# Patient Record
Sex: Male | Born: 1945 | Race: Black or African American | Hispanic: No | Marital: Married | Smoking: Current every day smoker
Health system: Southern US, Community
[De-identification: ages and names within clinical notes are randomized; demographics above are authoritative.]

## PROBLEM LIST (undated history)

## (undated) DIAGNOSIS — F419 Anxiety disorder, unspecified: Secondary | ICD-10-CM

## (undated) DIAGNOSIS — L299 Pruritus, unspecified: Secondary | ICD-10-CM

## (undated) DIAGNOSIS — B1920 Unspecified viral hepatitis C without hepatic coma: Secondary | ICD-10-CM

## (undated) DIAGNOSIS — I10 Essential (primary) hypertension: Secondary | ICD-10-CM

## (undated) DIAGNOSIS — H269 Unspecified cataract: Secondary | ICD-10-CM

## (undated) DIAGNOSIS — R0602 Shortness of breath: Secondary | ICD-10-CM

## (undated) DIAGNOSIS — J449 Chronic obstructive pulmonary disease, unspecified: Secondary | ICD-10-CM

## (undated) DIAGNOSIS — H409 Unspecified glaucoma: Secondary | ICD-10-CM

## (undated) DIAGNOSIS — R945 Abnormal results of liver function studies: Secondary | ICD-10-CM

## (undated) DIAGNOSIS — E785 Hyperlipidemia, unspecified: Secondary | ICD-10-CM

## (undated) DIAGNOSIS — F32A Depression, unspecified: Secondary | ICD-10-CM

## (undated) DIAGNOSIS — H9190 Unspecified hearing loss, unspecified ear: Secondary | ICD-10-CM

## (undated) DIAGNOSIS — F329 Major depressive disorder, single episode, unspecified: Secondary | ICD-10-CM

## (undated) DIAGNOSIS — K219 Gastro-esophageal reflux disease without esophagitis: Secondary | ICD-10-CM

## (undated) HISTORY — DX: Major depressive disorder, single episode, unspecified: F32.9

## (undated) HISTORY — DX: Chronic obstructive pulmonary disease, unspecified: J44.9

## (undated) HISTORY — DX: Hyperlipidemia, unspecified: E78.5

## (undated) HISTORY — DX: Anxiety disorder, unspecified: F41.9

## (undated) HISTORY — DX: Gastro-esophageal reflux disease without esophagitis: K21.9

## (undated) HISTORY — DX: Depression, unspecified: F32.A

## (undated) HISTORY — DX: Abnormal results of liver function studies: R94.5

## (undated) HISTORY — DX: Unspecified viral hepatitis C without hepatic coma: B19.20

## (undated) HISTORY — DX: Pruritus, unspecified: L29.9

## (undated) HISTORY — DX: Essential (primary) hypertension: I10

---

## 1998-02-19 ENCOUNTER — Encounter: Payer: Self-pay | Admitting: Emergency Medicine

## 1998-02-19 ENCOUNTER — Emergency Department (HOSPITAL_COMMUNITY): Admission: EM | Admit: 1998-02-19 | Discharge: 1998-02-19 | Payer: Self-pay | Admitting: Emergency Medicine

## 1998-02-20 ENCOUNTER — Emergency Department (HOSPITAL_COMMUNITY): Admission: EM | Admit: 1998-02-20 | Discharge: 1998-02-20 | Payer: Self-pay | Admitting: Emergency Medicine

## 1998-02-20 ENCOUNTER — Encounter: Payer: Self-pay | Admitting: *Deleted

## 1998-03-05 ENCOUNTER — Emergency Department (HOSPITAL_COMMUNITY): Admission: EM | Admit: 1998-03-05 | Discharge: 1998-03-05 | Payer: Self-pay | Admitting: Emergency Medicine

## 1998-03-05 ENCOUNTER — Encounter: Payer: Self-pay | Admitting: Emergency Medicine

## 1998-03-18 ENCOUNTER — Emergency Department (HOSPITAL_COMMUNITY): Admission: EM | Admit: 1998-03-18 | Discharge: 1998-03-18 | Payer: Self-pay | Admitting: Emergency Medicine

## 1998-03-18 ENCOUNTER — Encounter: Payer: Self-pay | Admitting: Emergency Medicine

## 1998-04-17 ENCOUNTER — Encounter: Payer: Self-pay | Admitting: Emergency Medicine

## 1998-04-17 ENCOUNTER — Emergency Department (HOSPITAL_COMMUNITY): Admission: EM | Admit: 1998-04-17 | Discharge: 1998-04-17 | Payer: Self-pay | Admitting: Emergency Medicine

## 1998-09-02 ENCOUNTER — Encounter: Payer: Self-pay | Admitting: Emergency Medicine

## 1998-09-02 ENCOUNTER — Emergency Department (HOSPITAL_COMMUNITY): Admission: EM | Admit: 1998-09-02 | Discharge: 1998-09-02 | Payer: Self-pay | Admitting: *Deleted

## 1998-12-15 ENCOUNTER — Emergency Department (HOSPITAL_COMMUNITY): Admission: EM | Admit: 1998-12-15 | Discharge: 1998-12-15 | Payer: Self-pay | Admitting: Emergency Medicine

## 1999-06-17 ENCOUNTER — Encounter: Payer: Self-pay | Admitting: Emergency Medicine

## 1999-06-17 ENCOUNTER — Emergency Department (HOSPITAL_COMMUNITY): Admission: EM | Admit: 1999-06-17 | Discharge: 1999-06-17 | Payer: Self-pay | Admitting: Emergency Medicine

## 2000-01-06 ENCOUNTER — Encounter: Payer: Self-pay | Admitting: Emergency Medicine

## 2000-01-06 ENCOUNTER — Emergency Department (HOSPITAL_COMMUNITY): Admission: EM | Admit: 2000-01-06 | Discharge: 2000-01-06 | Payer: Self-pay | Admitting: Emergency Medicine

## 2000-03-24 ENCOUNTER — Encounter: Payer: Self-pay | Admitting: Emergency Medicine

## 2000-03-24 ENCOUNTER — Emergency Department (HOSPITAL_COMMUNITY): Admission: EM | Admit: 2000-03-24 | Discharge: 2000-03-24 | Payer: Self-pay | Admitting: Emergency Medicine

## 2000-03-30 ENCOUNTER — Encounter: Admission: RE | Admit: 2000-03-30 | Discharge: 2000-03-30 | Payer: Self-pay | Admitting: Internal Medicine

## 2000-07-24 ENCOUNTER — Emergency Department (HOSPITAL_COMMUNITY): Admission: EM | Admit: 2000-07-24 | Discharge: 2000-07-24 | Payer: Self-pay | Admitting: Emergency Medicine

## 2000-07-24 ENCOUNTER — Encounter: Payer: Self-pay | Admitting: Emergency Medicine

## 2001-01-24 HISTORY — PX: OPEN ANTERIOR SHOULDER RECONSTRUCTION: SHX2100

## 2001-04-02 ENCOUNTER — Emergency Department (HOSPITAL_COMMUNITY): Admission: EM | Admit: 2001-04-02 | Discharge: 2001-04-02 | Payer: Self-pay

## 2001-06-22 ENCOUNTER — Encounter: Payer: Self-pay | Admitting: Emergency Medicine

## 2001-06-22 ENCOUNTER — Emergency Department (HOSPITAL_COMMUNITY): Admission: EM | Admit: 2001-06-22 | Discharge: 2001-06-22 | Payer: Self-pay | Admitting: Emergency Medicine

## 2001-06-24 ENCOUNTER — Emergency Department (HOSPITAL_COMMUNITY): Admission: EM | Admit: 2001-06-24 | Discharge: 2001-06-24 | Payer: Self-pay | Admitting: Emergency Medicine

## 2001-06-26 ENCOUNTER — Encounter: Payer: Self-pay | Admitting: Orthopedic Surgery

## 2001-06-26 ENCOUNTER — Ambulatory Visit (HOSPITAL_COMMUNITY): Admission: RE | Admit: 2001-06-26 | Discharge: 2001-06-27 | Payer: Self-pay | Admitting: Orthopedic Surgery

## 2001-12-27 ENCOUNTER — Emergency Department (HOSPITAL_COMMUNITY): Admission: EM | Admit: 2001-12-27 | Discharge: 2001-12-27 | Payer: Self-pay | Admitting: Emergency Medicine

## 2001-12-27 ENCOUNTER — Encounter: Payer: Self-pay | Admitting: Emergency Medicine

## 2001-12-31 ENCOUNTER — Encounter: Payer: Self-pay | Admitting: Emergency Medicine

## 2001-12-31 ENCOUNTER — Emergency Department (HOSPITAL_COMMUNITY): Admission: EM | Admit: 2001-12-31 | Discharge: 2001-12-31 | Payer: Self-pay | Admitting: Emergency Medicine

## 2002-02-10 ENCOUNTER — Encounter: Payer: Self-pay | Admitting: Emergency Medicine

## 2002-02-10 ENCOUNTER — Emergency Department (HOSPITAL_COMMUNITY): Admission: EM | Admit: 2002-02-10 | Discharge: 2002-02-10 | Payer: Self-pay | Admitting: Emergency Medicine

## 2002-07-04 ENCOUNTER — Encounter: Admission: RE | Admit: 2002-07-04 | Discharge: 2002-07-04 | Payer: Self-pay | Admitting: Internal Medicine

## 2002-08-01 ENCOUNTER — Encounter: Admission: RE | Admit: 2002-08-01 | Discharge: 2002-08-01 | Payer: Self-pay | Admitting: Internal Medicine

## 2003-10-17 ENCOUNTER — Ambulatory Visit (HOSPITAL_COMMUNITY): Admission: RE | Admit: 2003-10-17 | Discharge: 2003-10-17 | Payer: Self-pay | Admitting: Internal Medicine

## 2003-10-17 ENCOUNTER — Ambulatory Visit: Payer: Self-pay | Admitting: Internal Medicine

## 2003-11-18 ENCOUNTER — Emergency Department (HOSPITAL_COMMUNITY): Admission: EM | Admit: 2003-11-18 | Discharge: 2003-11-18 | Payer: Self-pay | Admitting: Emergency Medicine

## 2003-12-02 ENCOUNTER — Emergency Department (HOSPITAL_COMMUNITY): Admission: EM | Admit: 2003-12-02 | Discharge: 2003-12-02 | Payer: Self-pay | Admitting: Emergency Medicine

## 2004-05-19 ENCOUNTER — Ambulatory Visit: Payer: Self-pay | Admitting: Internal Medicine

## 2005-09-27 ENCOUNTER — Encounter (INDEPENDENT_AMBULATORY_CARE_PROVIDER_SITE_OTHER): Payer: Self-pay | Admitting: Cardiology

## 2005-09-27 ENCOUNTER — Ambulatory Visit: Payer: Self-pay | Admitting: Internal Medicine

## 2005-09-27 ENCOUNTER — Observation Stay (HOSPITAL_COMMUNITY): Admission: EM | Admit: 2005-09-27 | Discharge: 2005-09-28 | Payer: Self-pay | Admitting: Emergency Medicine

## 2005-09-27 ENCOUNTER — Encounter: Payer: Self-pay | Admitting: Vascular Surgery

## 2005-10-03 ENCOUNTER — Ambulatory Visit: Payer: Self-pay

## 2005-11-10 ENCOUNTER — Emergency Department (HOSPITAL_COMMUNITY): Admission: EM | Admit: 2005-11-10 | Discharge: 2005-11-10 | Payer: Self-pay | Admitting: Emergency Medicine

## 2005-11-26 ENCOUNTER — Emergency Department (HOSPITAL_COMMUNITY): Admission: EM | Admit: 2005-11-26 | Discharge: 2005-11-26 | Payer: Self-pay | Admitting: Emergency Medicine

## 2007-03-07 ENCOUNTER — Ambulatory Visit: Payer: Self-pay | Admitting: Internal Medicine

## 2007-03-07 ENCOUNTER — Encounter (INDEPENDENT_AMBULATORY_CARE_PROVIDER_SITE_OTHER): Payer: Self-pay | Admitting: Infectious Diseases

## 2007-03-07 LAB — CONVERTED CEMR LAB
AST: 31 units/L (ref 0–37)
Albumin: 4.1 g/dL (ref 3.5–5.2)
Alkaline Phosphatase: 78 units/L (ref 39–117)
Sodium: 139 meq/L (ref 135–145)
Total Bilirubin: 0.5 mg/dL (ref 0.3–1.2)
Total Protein: 7.3 g/dL (ref 6.0–8.3)

## 2007-03-08 ENCOUNTER — Encounter (INDEPENDENT_AMBULATORY_CARE_PROVIDER_SITE_OTHER): Payer: Self-pay | Admitting: Infectious Diseases

## 2007-03-08 ENCOUNTER — Ambulatory Visit (HOSPITAL_COMMUNITY): Admission: RE | Admit: 2007-03-08 | Discharge: 2007-03-08 | Payer: Self-pay | Admitting: Infectious Diseases

## 2007-03-08 LAB — CONVERTED CEMR LAB
Leukocytes, UA: NEGATIVE
Nitrite: NEGATIVE
Protein, ur: 100 mg/dL — AB
RBC / HPF: NONE SEEN (ref <3)
Specific Gravity, Urine: 1.012 (ref 1.005–1.03)
Urobilinogen, UA: 0.2 (ref 0.0–1.0)
WBC, UA: NONE SEEN cells/hpf (ref <3)

## 2007-03-21 ENCOUNTER — Ambulatory Visit: Payer: Self-pay | Admitting: Internal Medicine

## 2007-05-21 ENCOUNTER — Encounter (INDEPENDENT_AMBULATORY_CARE_PROVIDER_SITE_OTHER): Payer: Self-pay | Admitting: Internal Medicine

## 2007-06-15 ENCOUNTER — Ambulatory Visit: Payer: Self-pay | Admitting: Internal Medicine

## 2007-06-15 ENCOUNTER — Ambulatory Visit (HOSPITAL_COMMUNITY): Admission: RE | Admit: 2007-06-15 | Discharge: 2007-06-15 | Payer: Self-pay | Admitting: Internal Medicine

## 2007-06-15 ENCOUNTER — Encounter (INDEPENDENT_AMBULATORY_CARE_PROVIDER_SITE_OTHER): Payer: Self-pay | Admitting: Infectious Diseases

## 2007-06-15 LAB — CONVERTED CEMR LAB
CO2: 23 meq/L (ref 19–32)
Calcium: 8.5 mg/dL (ref 8.4–10.5)
Creatinine, Ser: 1.37 mg/dL (ref 0.40–1.50)

## 2007-07-01 ENCOUNTER — Emergency Department (HOSPITAL_COMMUNITY): Admission: EM | Admit: 2007-07-01 | Discharge: 2007-07-01 | Payer: Self-pay | Admitting: Emergency Medicine

## 2007-07-09 ENCOUNTER — Encounter: Admission: RE | Admit: 2007-07-09 | Discharge: 2007-10-07 | Payer: Self-pay | Admitting: Infectious Diseases

## 2007-08-09 ENCOUNTER — Encounter (INDEPENDENT_AMBULATORY_CARE_PROVIDER_SITE_OTHER): Payer: Self-pay | Admitting: *Deleted

## 2007-08-20 ENCOUNTER — Encounter (INDEPENDENT_AMBULATORY_CARE_PROVIDER_SITE_OTHER): Payer: Self-pay | Admitting: Internal Medicine

## 2007-09-05 ENCOUNTER — Telehealth: Payer: Self-pay | Admitting: *Deleted

## 2007-09-25 ENCOUNTER — Encounter (INDEPENDENT_AMBULATORY_CARE_PROVIDER_SITE_OTHER): Payer: Self-pay | Admitting: Internal Medicine

## 2007-09-25 ENCOUNTER — Ambulatory Visit: Payer: Self-pay | Admitting: Internal Medicine

## 2007-09-25 DIAGNOSIS — F4320 Adjustment disorder, unspecified: Secondary | ICD-10-CM | POA: Insufficient documentation

## 2007-10-19 ENCOUNTER — Encounter: Payer: Self-pay | Admitting: Internal Medicine

## 2007-10-19 ENCOUNTER — Ambulatory Visit: Payer: Self-pay | Admitting: Internal Medicine

## 2007-10-19 ENCOUNTER — Encounter (INDEPENDENT_AMBULATORY_CARE_PROVIDER_SITE_OTHER): Payer: Self-pay | Admitting: Internal Medicine

## 2007-10-19 ENCOUNTER — Ambulatory Visit (HOSPITAL_COMMUNITY): Admission: RE | Admit: 2007-10-19 | Discharge: 2007-10-19 | Payer: Self-pay | Admitting: Internal Medicine

## 2007-10-19 DIAGNOSIS — K219 Gastro-esophageal reflux disease without esophagitis: Secondary | ICD-10-CM

## 2007-10-19 LAB — CONVERTED CEMR LAB
BUN: 20 mg/dL (ref 6–23)
Calcium: 9 mg/dL (ref 8.4–10.5)
Chloride: 110 meq/L (ref 96–112)
Creatinine, Ser: 1.17 mg/dL (ref 0.40–1.50)
Eosinophils Absolute: 0.2 10*3/uL (ref 0.0–0.7)
HCT: 43.3 % (ref 39.0–52.0)
Hemoglobin: 15.2 g/dL (ref 13.0–17.0)
Lymphs Abs: 2.4 10*3/uL (ref 0.7–4.0)
Monocytes Relative: 8 % (ref 3–12)
Neutrophils Relative %: 59 % (ref 43–77)
RBC: 4.58 M/uL (ref 4.22–5.81)
WBC: 7.8 10*3/uL (ref 4.0–10.5)

## 2008-03-27 ENCOUNTER — Ambulatory Visit: Payer: Self-pay | Admitting: Internal Medicine

## 2008-03-27 ENCOUNTER — Encounter (INDEPENDENT_AMBULATORY_CARE_PROVIDER_SITE_OTHER): Payer: Self-pay | Admitting: Internal Medicine

## 2008-03-27 LAB — CONVERTED CEMR LAB
Band Neutrophils: 0 % (ref 0–10)
Basophils Absolute: 0 10*3/uL (ref 0.0–0.1)
Eosinophils Absolute: 0.1 10*3/uL (ref 0.0–0.7)
Hemoglobin: 15.5 g/dL (ref 13.0–17.0)
Lymphs Abs: 2.5 10*3/uL (ref 0.7–4.0)
Monocytes Relative: 9 % (ref 3–12)
Neutro Abs: 6.4 10*3/uL (ref 1.7–7.7)
Neutrophils Relative %: 65 % (ref 43–77)

## 2008-03-28 ENCOUNTER — Ambulatory Visit (HOSPITAL_COMMUNITY): Admission: RE | Admit: 2008-03-28 | Discharge: 2008-03-28 | Payer: Self-pay | Admitting: Internal Medicine

## 2008-03-31 ENCOUNTER — Telehealth (INDEPENDENT_AMBULATORY_CARE_PROVIDER_SITE_OTHER): Payer: Self-pay | Admitting: Internal Medicine

## 2008-04-17 ENCOUNTER — Ambulatory Visit: Payer: Self-pay | Admitting: Internal Medicine

## 2008-10-10 ENCOUNTER — Ambulatory Visit: Payer: Self-pay | Admitting: Internal Medicine

## 2008-10-10 DIAGNOSIS — N401 Enlarged prostate with lower urinary tract symptoms: Secondary | ICD-10-CM

## 2008-10-10 LAB — CONVERTED CEMR LAB
BUN: 16 mg/dL (ref 6–23)
CO2: 24 meq/L (ref 19–32)
Calcium: 8.8 mg/dL (ref 8.4–10.5)
Chloride: 110 meq/L (ref 96–112)
Cholesterol: 147 mg/dL (ref 0–200)
Glucose, Bld: 71 mg/dL (ref 70–99)
HDL: 34 mg/dL — ABNORMAL LOW (ref >39)
Potassium: 4 meq/L (ref 3.5–5.3)
Total CHOL/HDL Ratio: 4.3
Triglycerides: 188 mg/dL — ABNORMAL HIGH (ref <150)

## 2008-12-25 ENCOUNTER — Ambulatory Visit: Payer: Self-pay | Admitting: Internal Medicine

## 2009-02-17 ENCOUNTER — Ambulatory Visit: Payer: Self-pay | Admitting: Internal Medicine

## 2009-02-17 DIAGNOSIS — I10 Essential (primary) hypertension: Secondary | ICD-10-CM

## 2009-02-19 ENCOUNTER — Telehealth: Payer: Self-pay | Admitting: Internal Medicine

## 2009-03-12 ENCOUNTER — Ambulatory Visit: Payer: Self-pay | Admitting: Internal Medicine

## 2009-03-12 ENCOUNTER — Telehealth: Payer: Self-pay | Admitting: Internal Medicine

## 2009-03-12 DIAGNOSIS — F341 Dysthymic disorder: Secondary | ICD-10-CM | POA: Insufficient documentation

## 2009-03-25 ENCOUNTER — Ambulatory Visit: Payer: Self-pay | Admitting: Infectious Diseases

## 2009-03-25 DIAGNOSIS — N529 Male erectile dysfunction, unspecified: Secondary | ICD-10-CM

## 2009-03-25 LAB — CONVERTED CEMR LAB
BUN: 18 mg/dL (ref 6–23)
CO2: 25 meq/L (ref 19–32)
Calcium: 8.9 mg/dL (ref 8.4–10.5)
Chloride: 104 meq/L (ref 96–112)
Creatinine, Ser: 0.95 mg/dL (ref 0.40–1.50)
Glucose, Bld: 72 mg/dL (ref 70–99)

## 2009-03-30 ENCOUNTER — Telehealth: Payer: Self-pay | Admitting: Internal Medicine

## 2009-04-15 ENCOUNTER — Telehealth: Payer: Self-pay | Admitting: Internal Medicine

## 2009-05-04 ENCOUNTER — Telehealth: Payer: Self-pay | Admitting: Internal Medicine

## 2009-05-14 ENCOUNTER — Ambulatory Visit: Payer: Self-pay | Admitting: Internal Medicine

## 2009-06-01 ENCOUNTER — Telehealth: Payer: Self-pay | Admitting: *Deleted

## 2009-06-05 ENCOUNTER — Encounter: Payer: Self-pay | Admitting: Internal Medicine

## 2009-06-11 ENCOUNTER — Encounter: Payer: Self-pay | Admitting: Internal Medicine

## 2009-06-12 ENCOUNTER — Telehealth: Payer: Self-pay | Admitting: Internal Medicine

## 2009-06-18 ENCOUNTER — Telehealth: Payer: Self-pay | Admitting: Internal Medicine

## 2009-06-24 DIAGNOSIS — L299 Pruritus, unspecified: Secondary | ICD-10-CM

## 2009-06-24 HISTORY — DX: Pruritus, unspecified: L29.9

## 2009-06-29 ENCOUNTER — Ambulatory Visit: Payer: Self-pay | Admitting: Internal Medicine

## 2009-07-10 ENCOUNTER — Telehealth: Payer: Self-pay | Admitting: Internal Medicine

## 2009-07-10 ENCOUNTER — Encounter: Payer: Self-pay | Admitting: Internal Medicine

## 2009-08-21 ENCOUNTER — Ambulatory Visit: Payer: Self-pay | Admitting: Internal Medicine

## 2009-08-21 LAB — CONVERTED CEMR LAB
AST: 29 units/L (ref 0–37)
BUN: 20 mg/dL (ref 6–23)
Basophils Absolute: 0 10*3/uL (ref 0.0–0.1)
Calcium: 9 mg/dL (ref 8.4–10.5)
Chloride: 103 meq/L (ref 96–112)
Creatinine, Ser: 1.11 mg/dL (ref 0.40–1.50)
Eosinophils Absolute: 0.1 10*3/uL (ref 0.0–0.7)
Eosinophils Relative: 2 % (ref 0–5)
HCT: 46.5 % (ref 39.0–52.0)
Hemoglobin: 16 g/dL (ref 13.0–17.0)
Lymphocytes Relative: 25 % (ref 12–46)
Lymphs Abs: 2 10*3/uL (ref 0.7–4.0)
MCV: 94.1 fL (ref >=78.0)
Monocytes Absolute: 0.6 10*3/uL (ref 0.1–1.0)
RDW: 13.4 % (ref 11.5–15.5)
Total Bilirubin: 1 mg/dL (ref 0.3–1.2)

## 2009-08-28 ENCOUNTER — Telehealth: Payer: Self-pay | Admitting: Internal Medicine

## 2009-08-31 ENCOUNTER — Telehealth: Payer: Self-pay | Admitting: Internal Medicine

## 2009-09-02 ENCOUNTER — Telehealth: Payer: Self-pay | Admitting: *Deleted

## 2009-09-29 ENCOUNTER — Telehealth: Payer: Self-pay | Admitting: Internal Medicine

## 2009-09-29 ENCOUNTER — Emergency Department (HOSPITAL_COMMUNITY)
Admission: EM | Admit: 2009-09-29 | Discharge: 2009-09-29 | Payer: Self-pay | Source: Home / Self Care | Admitting: Family Medicine

## 2009-09-30 ENCOUNTER — Telehealth: Payer: Self-pay | Admitting: *Deleted

## 2009-10-05 ENCOUNTER — Ambulatory Visit: Payer: Self-pay | Admitting: Internal Medicine

## 2009-10-05 ENCOUNTER — Encounter: Payer: Self-pay | Admitting: Licensed Clinical Social Worker

## 2009-10-05 ENCOUNTER — Encounter: Payer: Self-pay | Admitting: Internal Medicine

## 2009-10-06 LAB — CONVERTED CEMR LAB
BUN: 18 mg/dL
CO2: 29 meq/L
Calcium: 9 mg/dL
Chloride: 106 meq/L
Creatinine, Ser: 1.1 mg/dL
Glucose, Bld: 97 mg/dL
Potassium: 3.6 meq/L
Sodium: 144 meq/L
Vitamin B-12: 517 pg/mL

## 2009-10-07 ENCOUNTER — Telehealth: Payer: Self-pay | Admitting: *Deleted

## 2009-11-02 ENCOUNTER — Telehealth: Payer: Self-pay | Admitting: Internal Medicine

## 2009-11-02 ENCOUNTER — Emergency Department (HOSPITAL_COMMUNITY)
Admission: EM | Admit: 2009-11-02 | Discharge: 2009-11-02 | Payer: Self-pay | Source: Home / Self Care | Admitting: Family Medicine

## 2009-11-16 ENCOUNTER — Ambulatory Visit: Payer: Self-pay | Admitting: Internal Medicine

## 2009-11-16 DIAGNOSIS — L299 Pruritus, unspecified: Secondary | ICD-10-CM | POA: Insufficient documentation

## 2009-11-20 LAB — CONVERTED CEMR LAB
CO2: 27 meq/L (ref 19–32)
Creatinine, Ser: 1.28 mg/dL (ref 0.40–1.50)
Glucose, Bld: 93 mg/dL (ref 70–99)
Total Bilirubin: 0.9 mg/dL (ref 0.3–1.2)

## 2009-12-02 ENCOUNTER — Ambulatory Visit: Payer: Self-pay | Admitting: Internal Medicine

## 2009-12-02 DIAGNOSIS — R7401 Elevation of levels of liver transaminase levels: Secondary | ICD-10-CM | POA: Insufficient documentation

## 2009-12-02 DIAGNOSIS — R740 Nonspecific elevation of levels of transaminase and lactic acid dehydrogenase [LDH]: Secondary | ICD-10-CM

## 2009-12-23 ENCOUNTER — Ambulatory Visit: Payer: Self-pay | Admitting: Internal Medicine

## 2010-01-04 ENCOUNTER — Encounter: Payer: Self-pay | Admitting: Internal Medicine

## 2010-01-24 DIAGNOSIS — R7989 Other specified abnormal findings of blood chemistry: Secondary | ICD-10-CM

## 2010-01-24 DIAGNOSIS — B1920 Unspecified viral hepatitis C without hepatic coma: Secondary | ICD-10-CM

## 2010-01-24 DIAGNOSIS — R945 Abnormal results of liver function studies: Secondary | ICD-10-CM

## 2010-01-24 HISTORY — DX: Unspecified viral hepatitis C without hepatic coma: B19.20

## 2010-01-24 HISTORY — DX: Other specified abnormal findings of blood chemistry: R79.89

## 2010-01-24 HISTORY — DX: Abnormal results of liver function studies: R94.5

## 2010-02-01 ENCOUNTER — Encounter: Payer: Self-pay | Admitting: Internal Medicine

## 2010-02-01 ENCOUNTER — Ambulatory Visit
Admission: RE | Admit: 2010-02-01 | Discharge: 2010-02-01 | Payer: Self-pay | Source: Home / Self Care | Attending: Internal Medicine | Admitting: Internal Medicine

## 2010-02-02 ENCOUNTER — Ambulatory Visit: Admit: 2010-02-02 | Payer: Self-pay

## 2010-02-03 LAB — CONVERTED CEMR LAB
ALT: 35 U/L (ref 0–53)
AST: 45 U/L — ABNORMAL HIGH (ref 0–37)
Albumin: 4.2 g/dL (ref 3.5–5.2)
Alkaline Phosphatase: 189 U/L — ABNORMAL HIGH (ref 39–117)
Bilirubin, Direct: 0.7 mg/dL — ABNORMAL HIGH (ref 0.0–0.3)
Indirect Bilirubin: 0.7 mg/dL (ref 0.0–0.9)
Total Bilirubin: 1.4 mg/dL — ABNORMAL HIGH (ref 0.3–1.2)
Total Protein: 7.5 g/dL (ref 6.0–8.3)

## 2010-02-12 ENCOUNTER — Telehealth: Payer: Self-pay | Admitting: *Deleted

## 2010-02-18 ENCOUNTER — Ambulatory Visit: Admission: RE | Admit: 2010-02-18 | Discharge: 2010-02-18 | Payer: Self-pay | Source: Home / Self Care

## 2010-02-19 ENCOUNTER — Telehealth: Payer: Self-pay | Admitting: Internal Medicine

## 2010-02-19 ENCOUNTER — Ambulatory Visit
Admission: RE | Admit: 2010-02-19 | Discharge: 2010-02-19 | Payer: Self-pay | Source: Home / Self Care | Attending: Internal Medicine | Admitting: Internal Medicine

## 2010-02-19 LAB — CONVERTED CEMR LAB
AST: 59 units/L — ABNORMAL HIGH (ref 0–37)
Albumin: 3.8 g/dL (ref 3.5–5.2)
BUN: 15 mg/dL (ref 6–23)
Calcium: 9.6 mg/dL (ref 8.4–10.5)
Chloride: 106 meq/L (ref 96–112)
Creatinine, Ser: 0.9 mg/dL (ref 0.40–1.50)
Eosinophils Relative: 3 % (ref 0–5)
Glucose, Bld: 104 mg/dL — ABNORMAL HIGH (ref 70–99)
HCT: 42.8 % (ref 39.0–52.0)
HCV Ab: REACTIVE — AB
HIV: NONREACTIVE
Hemoglobin: 14.5 g/dL (ref 13.0–17.0)
Hep A Total Ab: POSITIVE — AB
Hep B Core Total Ab: POSITIVE — AB
Hep B S Ab: NEGATIVE
Hepatitis B Surface Ag: NEGATIVE
Lymphocytes Relative: 22 % (ref 12–46)
Lymphs Abs: 1.8 10*3/uL (ref 0.7–4.0)
Monocytes Absolute: 0.9 10*3/uL (ref 0.1–1.0)
Monocytes Relative: 10 % (ref 3–12)
Neutro Abs: 5.5 10*3/uL (ref 1.7–7.7)
RBC: 4.61 M/uL (ref 4.22–5.81)
RDW: 14.4 % (ref 11.5–15.5)
WBC: 8.5 10*3/uL (ref 4.0–10.5)

## 2010-02-23 ENCOUNTER — Telehealth: Payer: Self-pay | Admitting: Internal Medicine

## 2010-02-23 ENCOUNTER — Encounter: Payer: Self-pay | Admitting: Internal Medicine

## 2010-02-23 ENCOUNTER — Ambulatory Visit (HOSPITAL_COMMUNITY)
Admission: RE | Admit: 2010-02-23 | Discharge: 2010-02-23 | Payer: Self-pay | Source: Home / Self Care | Attending: Internal Medicine | Admitting: Internal Medicine

## 2010-02-23 ENCOUNTER — Inpatient Hospital Stay (HOSPITAL_COMMUNITY)
Admission: AD | Admit: 2010-02-23 | Discharge: 2010-02-25 | DRG: 442 | Disposition: A | Discharge status: Home / Self Care | Payer: Medicare HMO | Attending: Internal Medicine | Admitting: Internal Medicine

## 2010-02-23 DIAGNOSIS — J4489 Other specified chronic obstructive pulmonary disease: Secondary | ICD-10-CM | POA: Diagnosis present

## 2010-02-23 DIAGNOSIS — B199 Unspecified viral hepatitis without hepatic coma: Principal | ICD-10-CM | POA: Diagnosis present

## 2010-02-23 DIAGNOSIS — K769 Liver disease, unspecified: Secondary | ICD-10-CM | POA: Diagnosis present

## 2010-02-23 DIAGNOSIS — K5900 Constipation, unspecified: Secondary | ICD-10-CM | POA: Diagnosis present

## 2010-02-23 DIAGNOSIS — I2789 Other specified pulmonary heart diseases: Secondary | ICD-10-CM | POA: Diagnosis present

## 2010-02-23 DIAGNOSIS — L299 Pruritus, unspecified: Secondary | ICD-10-CM | POA: Diagnosis present

## 2010-02-23 DIAGNOSIS — J449 Chronic obstructive pulmonary disease, unspecified: Secondary | ICD-10-CM | POA: Diagnosis present

## 2010-02-23 DIAGNOSIS — F17200 Nicotine dependence, unspecified, uncomplicated: Secondary | ICD-10-CM | POA: Diagnosis present

## 2010-02-23 DIAGNOSIS — F411 Generalized anxiety disorder: Secondary | ICD-10-CM | POA: Diagnosis present

## 2010-02-23 DIAGNOSIS — N390 Urinary tract infection, site not specified: Secondary | ICD-10-CM | POA: Diagnosis present

## 2010-02-23 DIAGNOSIS — F341 Dysthymic disorder: Secondary | ICD-10-CM | POA: Diagnosis present

## 2010-02-23 DIAGNOSIS — R112 Nausea with vomiting, unspecified: Secondary | ICD-10-CM | POA: Diagnosis present

## 2010-02-23 LAB — COMPREHENSIVE METABOLIC PANEL
AST: 62 U/L — ABNORMAL HIGH (ref 0–37)
CO2: 21 mEq/L (ref 19–32)
Calcium: 9.2 mg/dL (ref 8.4–10.5)
Creatinine, Ser: 1.18 mg/dL (ref 0.4–1.5)
GFR calc Af Amer: >60 mL/min (ref >60)
GFR calc non Af Amer: >60 mL/min (ref >60)

## 2010-02-23 LAB — CBC
MCH: 30.9 pg (ref 26.0–34.0)
MCHC: 34.6 g/dL (ref 30.0–36.0)
Platelets: 316 10*3/uL (ref 150–400)
RDW: 14.1 % (ref 11.5–15.5)

## 2010-02-23 LAB — LIPID PANEL
Cholesterol: 157 mg/dL (ref 0–200)
HDL: 28 mg/dL — ABNORMAL LOW (ref >39)
LDL Cholesterol: 71 mg/dL (ref 0–99)
Total CHOL/HDL Ratio: 5.6 RATIO
Triglycerides: 290 mg/dL — ABNORMAL HIGH (ref <150)

## 2010-02-23 LAB — APTT: aPTT: 30 seconds (ref 24–37)

## 2010-02-23 LAB — PROTIME-INR: INR: 0.87 (ref 0.00–1.49)

## 2010-02-23 NOTE — Progress Notes (Signed)
Summary: med refill/gp  Phone Note Refill Request Message from:  Fax from Pharmacy on November 02, 2009 4:48 PM  Refills Requested: Medication #1:  ALPRAZOLAM 0.5 MG TABS Take 1 tablet by mouth two times a day as needed for anxiety.   Dosage confirmed as above?Dosage Confirmed   Last Refilled: 10/07/2009  Method Requested: Telephone to Pharmacy Initial call taken by: Chinita Pester RN,  November 02, 2009 4:48 PM  Follow-up for Phone Call        Rx completed in Dr. Tiajuana Amass Follow-up by: Jackson Latino MD,  November 03, 2009 1:41 PM    Prescriptions: ALPRAZOLAM 0.5 MG TABS (ALPRAZOLAM) Take 1 tablet by mouth two times a day as needed for anxiety.  #60 x 0   Entered and Authorized by:   Jackson Latino MD   Signed by:   Jackson Latino MD on 11/03/2009   Method used:   Telephoned to ...       [facility] [phone]* (retail)       [address]       [address]       [place], [place]  [address]       Ph: [phone]       Fax: [phone]   RxID:   [id number]   Appended Document: med refill/gp Alprazolam Rx called to [facility] pharmacy.

## 2010-02-23 NOTE — Progress Notes (Signed)
Summary: Medication change needed   Phone Note From Pharmacy   Caller: [facility] Department Summary of Call: Celexa is not available at the [facility].  Pharmacy has Wellbutrin SR 150, Fluoxetine 20 mg, Paroxtine 20 mg,  Sertratine 50 mg or Amitriptylene or Nortriplyene.  Pt needs a call at [phone] when done. Initial call taken by: Angelina Ok RN,  March 30, 2009 4:29 PM  Follow-up for Phone Call        Called Patient and will change to fluoxetine. Follow-up by: Jackson Latino MD,  March 30, 2009 4:56 PM    New/Updated Medications: FLUOXETINE HCL 20 MG CAPS (FLUOXETINE HCL) Take 1 tablet by mouth once a day Prescriptions: FLUOXETINE HCL 20 MG CAPS (FLUOXETINE HCL) Take 1 tablet by mouth once a day  #31 x 2   Entered and Authorized by:   Jackson Latino MD   Signed by:   Jackson Latino MD on 03/30/2009   Method used:   Faxed to ...       [facility] (retail)       [address] [place], [place]  [address]       Ph: [phone]       Fax: [phone]   RxID:   [id number]    Updated Prior Medication List: VENTOLIN HFA 108 (90 BASE) MCG/ACT AERS (ALBUTEROL SULFATE) use 1-2 puffs every 4 hours as needed for shortness of breath or wheezing SPIRIVA HANDIHALER 18 MCG CAPS (TIOTROPIUM BROMIDE MONOHYDRATE) 1 puff daily ADVAIR DISKUS 250-50 MCG/DOSE MISC (FLUTICASONE-SALMETEROL) 2 puff two times a day CARDURA 4 MG TABS (DOXAZOSIN MESYLATE) Take 1 tablet by mouth once a day CIPRODEX 0.3-0.1 % SUSP (CIPROFLOXACIN-DEXAMETHASONE) 4 drops to affected ear 4 times daily. ALPRAZOLAM 0.5 MG TABS (ALPRAZOLAM) Take 1 tablet by mouth two times a day as needed for anxiety. HYDROCHLOROTHIAZIDE 25 MG TABS (HYDROCHLOROTHIAZIDE) Take 1 tablet by mouth every morning FLUOXETINE HCL 20 MG CAPS (FLUOXETINE HCL) Take 1 tablet by mouth once a day  Current Allergies: No known allergies

## 2010-02-23 NOTE — Assessment & Plan Note (Signed)
Summary: itching/gg   Vital Signs:  Patient profile:   65 year old male Height:      69 inches (175.26 cm) Weight:      246.8 pounds (112.18 kg) BMI:     36.58 Temp:     96.8 degrees F (36 degrees C) oral Pulse rate:   72 / minute BP sitting:   140 / 81  (right arm)  Vitals Entered By: Stanton Kidney Ditzler RN (November 16, 2009 3:37 PM) CC: itching Is Patient Diabetic? No Pain Assessment Patient in pain? yes     Location: legs Intensity: 6-7 Type: sharp Onset of pain  years Nutritional Status BMI of > 30 = obese Nutritional Status Detail appetite good  Have you ever been in a relationship where you felt threatened, hurt or afraid?denies   Does patient need assistance? Functional Status Self care Ambulation Normal Comments Itching all over - no help with meds. Going on 6 weeks. Not sleeping due to itching.   Primary Care Provider:  Jackson Latino MD  CC:  itching.  History of Present Illness: This is a 65 year old pleasant male with PMH with Depression, COPD, HTN, BPH, who presents to the clinic for BP check and  intolerance of 2 medications.   1. Generalized itching, pt has been having itching ever since he was started on opiates for his OA, he went to ED recently, labs were WNL. no history of liver disease, alcohol abuse or IV drug abuse.   2. Depression/Anxiety: Stable  3. HTN: well controlled on HCTZ   Patient is feeling well and denies CP, abdominal pain, nausea, vomiting, HA's, palpitations, blurred vision. fever, chills, diarrhea, constipation or SOB.    Depression History:      The patient denies a depressed mood most of the day and a diminished interest in his usual daily activities.  The patient denies significant weight loss, significant weight gain, insomnia, hypersomnia, psychomotor agitation, psychomotor retardation, fatigue (loss of energy), feelings of worthlessness (guilt), impaired concentration (indecisiveness), and recurrent thoughts of death or suicide.          Preventive Screening-Counseling & Management  Alcohol-Tobacco     Alcohol type: BEER / EVERYDAY SOMETIMES     Smoking Status: current     Smoking Cessation Counseling: yes     Smoke Cessation Stage: ready     Packs/Day: 1 ppd     Year Started: ABOUT THE AGE OF 16  Caffeine-Diet-Exercise     Does Patient Exercise: no  Current Medications (verified): 1)  Ventolin Hfa 108 (90 Base) Mcg/act Aers (Albuterol Sulfate) .... Use 1-2 Puffs Every 4 Hours As Needed For Shortness of Breath or Wheezing 2)  Spiriva Handihaler 18 Mcg Caps (Tiotropium Bromide Monohydrate) .Marland Kitchen.. 1 Puff Daily 3)  Advair Diskus 250-50 Mcg/dose Misc (Fluticasone-Salmeterol) .Marland Kitchen.. 1 Puff Two Times A Day 4)  Alprazolam 0.5 Mg Tabs (Alprazolam) .... Take 1 Tablet By Mouth Two Times A Day As Needed For Anxiety. 5)  Hydrochlorothiazide 25 Mg Tabs (Hydrochlorothiazide) .... Take 1 Tablet By Mouth Every Morning 6)  Guaiatussin Ac 100-10 Mg/71ml Syrp (Guaifenesin-Codeine) .... Take One Teaspoonful Every 4 Hours As Needed For Cough 7)  Tramadol Hcl 50 Mg Tabs (Tramadol Hcl) .... Take 1 Tablet By Mouth Three Times A Day For Pain. 8)  Celexa 20 Mg Tabs (Citalopram Hydrobromide) .... Take One Tablet By Mouth Once A Day. 9)  Hydroxyzine Hcl 50 Mg Tabs (Hydroxyzine Hcl) .... Take 1 Tablet By Mouth Three Times A Day As Needed  For Itching  Allergies (verified): No Known Drug Allergies  Social History: Packs/Day:  1 ppd  Review of Systems       per HPI  Physical Exam  General:  Well-developed,well-nourished,itching - generalized Neck:  No deformities, masses, or tenderness noted. Lungs:  Normal respiratory effort, chest expands symmetrically. Lungs are clear to auscultation, no crackles or wheezes. Heart:  Normal rate and regular rhythm. S1 and S2 normal without gallop, murmur, click, rub or other extra sounds. Abdomen:  Bowel sounds positive,abdomen soft and non-tender without masses, organomegaly or hernias noted. Msk:   Tenderness to palpation and mild swelling on his right knee; no redness,no warmth and no other abnormalities appreciated. Pulses:  R and L carotid,radial,dorsalis pedis and posterior tibial pulses are full and equal bilaterally Extremities:  No clubbing, cyanosis, edema, or deformity noted with normal full range of motion of all joints.   Neurologic:  No cranial nerve deficits noted. Station and gait are normal. Plantar reflexes are down-going bilaterally. DTRs are symmetrical throughout. Sensory, motor and coordinative functions appear intact.   Impression & Recommendations:  Problem # 1:  PRURITUS (ICD-698.9) Assessment Comment Only ongoing since opiate initiation, will d/c all opiates and give tramadol for pain control.  will also check CMET to r/o liver etiology.  All other labs inc. CBC, ESR were negative in ED 2 weeks ago.  will also give hydroxyzine for itching.   Orders: T-Comprehensive Metabolic Panel ([id number])  Problem # 2:  OSTEOARTHRITIS, MULTIPLE JOINTS (ICD-715.89) Assessment: Comment Only per #1.  His updated medication list for this problem includes:    Tramadol Hcl 50 Mg Tabs (Tramadol hcl) .Marland Kitchen... Take 1 tablet by mouth three times a day for pain.  Problem # 3:  ESSENTIAL HYPERTENSION, BENIGN (ICD-401.1) Assessment: Comment Only Well controlled on current treatment, No new changes made today, Will continue to monitor.   His updated medication list for this problem includes:    Hydrochlorothiazide 25 Mg Tabs (Hydrochlorothiazide) .Marland Kitchen... Take 1 tablet by mouth every morning  BP today: 140/81 Prior BP: 121/76 (10/05/2009)  Labs Reviewed: K+: 3.6 (10/05/2009) Creat: : 1.10 (10/05/2009)   Chol: 147 (10/10/2008)   HDL: 34 (10/10/2008)   LDL: 75 (10/10/2008)   TG: 188 (10/10/2008)  Complete Medication List: 1)  Ventolin Hfa 108 (90 Base) Mcg/act Aers (Albuterol sulfate) .... Use 1-2 puffs every 4 hours as needed for shortness of breath or wheezing 2)  Spiriva  Handihaler 18 Mcg Caps (Tiotropium bromide monohydrate) .Marland Kitchen.. 1 puff daily 3)  Advair Diskus 250-50 Mcg/dose Misc (Fluticasone-salmeterol) .Marland Kitchen.. 1 puff two times a day 4)  Alprazolam 0.5 Mg Tabs (Alprazolam) .... Take 1 tablet by mouth two times a day as needed for anxiety. 5)  Hydrochlorothiazide 25 Mg Tabs (Hydrochlorothiazide) .... Take 1 tablet by mouth every morning 6)  Guaiatussin Ac 100-10 Mg/56ml Syrp (Guaifenesin-codeine) .... Take one teaspoonful every 4 hours as needed for cough 7)  Tramadol Hcl 50 Mg Tabs (Tramadol hcl) .... Take 1 tablet by mouth three times a day for pain. 8)  Celexa 20 Mg Tabs (Citalopram hydrobromide) .... Take one tablet by mouth once a day. 9)  Hydroxyzine Hcl 50 Mg Tabs (Hydroxyzine hcl) .... Take 1 tablet by mouth three times a day as needed for itching  Patient Instructions: 1)  Please schedule a follow-up appointment in 1 month. Prescriptions: HYDROXYZINE HCL 50 MG TABS (HYDROXYZINE HCL) Take 1 tablet by mouth three times a day as needed for itching  #90 x 0   Entered and  Authorized by:   Darnelle Maffucci MD   Signed by:   Darnelle Maffucci MD on 11/16/2009   Method used:   Print then Give to Patient   RxID:   [id number] TRAMADOL HCL 50 MG TABS (TRAMADOL HCL) Take 1 tablet by mouth three times a day for pain.  #90 x 0   Entered and Authorized by:   Darnelle Maffucci MD   Signed by:   Darnelle Maffucci MD on 11/16/2009   Method used:   Print then Give to Patient   RxID:   [id number]    Orders Added: 1)  T-Comprehensive Metabolic Panel [[id number]] 2)  Est. Patient Level IV [[address]]   Process Orders Check Orders Results:     [facility]: Check successful Tests Sent for requisitioning (November 16, 2009 4:35 PM):     11/16/2009: [facility] -- T-Comprehensive Metabolic Panel [id number] (signed)    Prevention & Chronic Care Immunizations   Influenza vaccine: Fluvax Non-MCR  (10/05/2009)   Influenza vaccine  deferral: Deferred  (10/10/2008)    Tetanus booster: Not documented    Pneumococcal vaccine: Pneumovax  (02/17/2009)    H. zoster vaccine: Not documented  Colorectal Screening   Hemoccult: Not documented    Colonoscopy: Multiple polys up to 15mm.  Tubular adenomas and hyperplastic polyps.  No dysplasia nor malignancy.  Repeat colonoscopy 5/14  (06/11/2009)   Colonoscopy action/deferral: GI referral  (05/14/2009)   Colonoscopy due: 06/11/2012  Other Screening   PSA: Not documented   PSA action/deferral: Discussed-decision deferred  (03/25/2009)   Smoking status: current  (11/16/2009)   Smoking cessation counseling: yes  (11/16/2009)  Lipids   Total Cholesterol: 147  (10/10/2008)   Lipid panel action/deferral: Lipid Panel ordered   LDL: 75  (10/10/2008)   LDL Direct: Not documented   HDL: 34  (10/10/2008)   Triglycerides: 188  (10/10/2008)  Hypertension   Last Blood Pressure: 140 / 81  (11/16/2009)   Serum creatinine: 1.10  (10/05/2009)   Serum potassium 3.6  (10/05/2009) CMP ordered   Self-Management Support :   Personal Goals (by the next clinic visit) :      Personal blood pressure goal: 140/90  (03/12/2009)   Patient will work on the following items until the next clinic visit to reach self-care goals:     Medications and monitoring: take my medicines every day, bring all of my medications to every visit  (11/16/2009)     Eating: eat more vegetables, use fresh or frozen vegetables, eat foods that are low in salt, eat fruit for snacks and desserts, limit or avoid alcohol  (11/16/2009)     Activity: take a 30 minute walk every day, take the stairs instead of the elevator, park at the far end of the parking lot  (11/16/2009)    Hypertension self-management support: Written self-care plan, Education handout, Resources for patients handout  (11/16/2009)   Hypertension self-care plan printed.   Hypertension education handout printed      Resource handout printed.

## 2010-02-23 NOTE — Medication Information (Signed)
Summary: PERCOCET  PERCOCET   Imported By: Margie Billet 07/29/2009 10:56:39  _____________________________________________________________________  External Attachment:    Type:   Image     Comment:   External Document

## 2010-02-23 NOTE — Assessment & Plan Note (Signed)
Summary: BP check and review meds/gg   Vital Signs:  Patient profile:   65 year old male Height:      69 inches (175.26 cm) Weight:      241.9 pounds (109.95 kg) BMI:     35.85 O2 Sat:      94 % on Room air Temp:     97.0 degrees F oral Pulse rate:   70 / minute BP sitting:   121 / 76  (right arm)  Vitals Entered By: Chinita Pester RN (October 05, 2009 9:41 AM)  O2 Flow:  Room air CC: BP check.  Also states had reaction to Prozac -"not myself", sleeping, outbursts.    Cardura causes itching and coughing-had to go to Urgent Care. Medication refills. Is Patient Diabetic? No Pain Assessment Patient in pain? no      Nutritional Status BMI of > 30 = obese  Have you ever been in a relationship where you felt threatened, hurt or afraid?Unable to ask; wife w/pt.   Does patient need assistance? Functional Status Self care Ambulation Normal   Primary Care Provider:  Jackson Latino MD  CC:  BP check.  Also states had reaction to Prozac -"not myself", sleeping, and outbursts.    Cardura causes itching and coughing-had to go to Urgent Care. Medication refills..  History of Present Illness: This is a 65 year old pleasant male with PMH with Depression, COPD, HTN, BPH, who presents to the clinic for BP check and  intolerance of 2 medications.   1. Depression/Anxiety: Patient stopped  3 days ago fluoxetin since he was feeling more depressed , sleepy,  sad, loss of appetite and  loss of energy. Wife noted since 04/08/2022 his symptoms has worsen gradualy especially after some of his friends passed away. Before 2022-04-08 he was doing some gardening and fishing but now he just sleeps all day long and is not interested in anything.  He does not have suicidal ideation or plans but he wants a pill to make him happy again and have more energy. He is currently also on Alprazolam 1 mg once a day.  Note:  Patient had complained about depression and anxiety in the past. He was started on Fluoxetin  but  discontinued  due to cost, availability and side effects of mood swings. He was started on Celexa in Feb/2011 and was tolerating it very well until April 07, 2009 when he had to be changed to Fluoxetin since [facility] was not carrying Celexa.  Patient was also thought to have adjustment disorder since he stopped working 2 years ago after  working for 44 years.    2. BPH: Stopped Doxazosin 3 days ago since patient noted a 2 week history of  itching and  cough which did not resolve with benadryl. He was also seen at an urgent care facility. , Note: Patient was started on Flomax(Tamulosin) for BPH, however patient was unable to obtain this medication as it is not available at [facility]. The patient was given doazosin but he developed dizziness and subsequently stopped taking it. The [facility] pharmacy was called and they do not carry any other medication for BPH besides Doxazosin, and patient did not t have insurance and was unable to pay for anything else therefore he was started on a lower dosage in Feb/2011. He was taking Doxazosin in the past.   3. HTN: well controlled on HCTZ        Hearing aid, ent ?    Depression History:  The patient denies a depressed mood most of the day.  Positive alarm features for depression include hypersomnia, fatigue (loss of energy), and feelings of worthlessness (guilt).  However, he denies significant weight loss, significant weight gain, and recurrent thoughts of death or suicide.        Suicide risk questions reveal that he wishes that he were dead and he has thought about ending his life.  The patient denies that he feels like life is not worth living.        Comments:  feeling loss of energy since he is not working. .   Preventive Screening-Counseling & Management  Alcohol-Tobacco     Alcohol type: BEER / EVERYDAY SOMETIMES     Smoking Status: current     Smoking Cessation Counseling: yes     Smoke Cessation Stage:  ready     Packs/Day: 1/2 ppd     Year Started: ABOUT THE AGE OF 16  Caffeine-Diet-Exercise     Does Patient Exercise: no  Current Medications (verified): 1)  Ventolin Hfa 108 (90 Base) Mcg/act Aers (Albuterol Sulfate) .... Use 1-2 Puffs Every 4 Hours As Needed For Shortness of Breath or Wheezing 2)  Spiriva Handihaler 18 Mcg Caps (Tiotropium Bromide Monohydrate) .Marland Kitchen.. 1 Puff Daily 3)  Advair Diskus 250-50 Mcg/dose Misc (Fluticasone-Salmeterol) .Marland Kitchen.. 1 Puff Two Times A Day 4)  Alprazolam 0.5 Mg Tabs (Alprazolam) .... Take 1 Tablet By Mouth Two Times A Day As Needed For Anxiety. 5)  Hydrochlorothiazide 25 Mg Tabs (Hydrochlorothiazide) .... Take 1 Tablet By Mouth Every Morning 6)  Guaiatussin Ac 100-10 Mg/77ml Syrp (Guaifenesin-Codeine) .... Take One Teaspoonful Every 4 Hours As Needed For Cough 7)  Vicodin 5-500 Mg Tabs (Hydrocodone-Acetaminophen) .... Take 1 Tab By Mouth Every 6hrs As Needed For Pain.  Allergies: No Known Drug Allergies  Physical Exam  General:  Well-developed,well-nourished,in no acute distress; alert,appropriate and cooperative throughout examination Neck:  No deformities, masses, or tenderness noted. Lungs:  Normal respiratory effort, chest expands symmetrically. Lungs are clear to auscultation, no crackles or wheezes. Heart:  Normal rate and regular rhythm. S1 and S2 normal without gallop, murmur, click, rub or other extra sounds. Abdomen:  Bowel sounds positive,abdomen soft and non-tender without masses, organomegaly or hernias noted. Pulses:  R and L carotid,radial,dorsalis pedis and posterior tibial pulses are full and equal bilaterally Extremities:  No clubbing, cyanosis, edema, or deformity noted with normal full range of motion of all joints.   Neurologic:  No cranial nerve deficits noted. Station and gait are normal. Plantar reflexes are down-going bilaterally. DTRs are symmetrical throughout. Sensory, motor and coordinative functions appear intact. Cervical  Nodes:  No lymphadenopathy noted Psych:  Cognition and judgment appear intact. Alert and cooperative with normal attention span and concentration. No apparent delusions, illusions, hallucinations   Impression & Recommendations:  Problem # 1:  DEPRESSION/ANXIETY (ICD-300.4) Fluoxetin was d/c since pt experienced significant deterioration of his depression. Patient was started on Celexa which he has used in the past and had tolerated it well. It had to be changed to Fluoxetin since [facility] was not carrying Celexa in March. But Celexa is on the 4 $ plan. Furthermore pt was referred by Dorothe Pea  to  a therapist  at [facility] on 10/6 and a Psychiatric appointment  with Dr. Artis Del[facility]) on Nov 30th for further evalutation. Continue to follow closely.   Orders: T-Vitamin B12 [phone])  Problem # 2:  HYPERTROPHY PROSTATE W/UR OBST & OTH  LUTS (ICD-600.01) Patient stopped Cardura since he experienced  itching and cough. Patient was started on Flomax(Tamulosin) in the past  however patient was unable to obtain this medication as it was not available at [facility]. The patient was given doazosin as a  replacement, but he developed dizziness and subsequently stopped taking it. The [facility] pharmacy was called and they do not carry any other medication for BPH besides Doxazosin, and patient does not have insurance and is unable to afford other medications. To decrease the level of dizzines, patient's doxazosin dosage was reduced. At this point Cardura was not restarted considering his symptoms. In the near futur he does need a medication for BPH to reduce the risk for obstruction. We may referral patient to Urology for further management.   The following medications were removed from the medication list:    Cardura 4 Mg Tabs (Doxazosin mesylate) .Marland Kitchen... 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 Take as directed  Problem # 4:  ESSENTIAL HYPERTENSION, BENIGN (ICD-401.1)  The following medications were removed from the medication list:    Cardura 4 Mg Tabs (Doxazosin mesylate) .Marland Kitchen... Take 1 tablet by mouth once a day His updated medication list for this problem includes:    Hydrochlorothiazide 25 Mg Tabs (Hydrochlorothiazide) .Marland Kitchen... 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 Take 1 tab by mouth every 6hrs as needed for pain.  Complete Medication List: 1)  Ventolin Hfa 108 (90 Base) Mcg/act Aers (Albuterol sulfate) .... Use 1-2 puffs every 4 hours as needed for shortness of breath or wheezing 2)  Spiriva Handihaler 18 Mcg Caps (Tiotropium bromide monohydrate) .Marland Kitchen.. 1 puff daily 3)  Advair Diskus 250-50 Mcg/dose Misc (Fluticasone-salmeterol) .Marland Kitchen.. 1 puff two times a day 4)  Alprazolam 0.5 Mg Tabs (Alprazolam) .... Take 1 tablet by mouth two times a day as needed for anxiety. 5)  Hydrochlorothiazide 25 Mg Tabs (Hydrochlorothiazide) .... Take 1 tablet by mouth every morning 6)  Guaiatussin Ac 100-10 Mg/40ml Syrp (Guaifenesin-codeine) .... Take one teaspoonful every 4 hours as needed for cough 7)  Vicodin 5-500 Mg Tabs (Hydrocodone-acetaminophen) .... Take 1 tab by mouth every 6hrs as needed for pain. 8)  Celexa 20 Mg Tabs (Citalopram hydrobromide) .... Take one tablet by mouth once a day.  Other Orders: Influenza Vaccine NON MCR ([id number])  Patient Instructions: 1)  Please  schedule a follow-up appointment as needed. 2)  Please schedule an appointment with your primary doctor. 3)  I  will be calling you concerning the dosage of Prosac.  Prescriptions: ALPRAZOLAM 0.5 MG TABS (ALPRAZOLAM) Take 1 tablet by mouth two times a day as needed for anxiety.  #60 x 0   Entered and Authorized by:   Almyra Deforest MD   Signed by:   Almyra Deforest MD on 10/06/2009   Method used:   Print then Give to Patient   RxID:   [id number] VICODIN 5-500 MG TABS (HYDROCODONE-ACETAMINOPHEN) Take 1 tab  by mouth every 6hrs as needed for pain.  #120 x 0   Entered and Authorized by:   Almyra Deforest MD   Signed by:   Almyra Deforest MD on 10/06/2009   Method used:   Print then Give to Patient   RxID:   [id number] CELEXA 20 MG TABS (CITALOPRAM HYDROBROMIDE) Take one tablet by mouth once a day.  #30 x 2   Entered and Authorized by:   Almyra Deforest MD   Signed by:   Almyra Deforest MD on 10/06/2009   Method used:   Print then Give to Patient   RxID:   [id number] SPIRIVA HANDIHALER 18 MCG CAPS (TIOTROPIUM BROMIDE MONOHYDRATE) 1 puff daily  #1 x 2   Entered and Authorized by:   Almyra Deforest MD   Signed by:   Almyra Deforest MD on 10/06/2009   Method used:   Print then Give to Patient   RxID:   [id number] HYDROCHLOROTHIAZIDE 25 MG TABS (HYDROCHLOROTHIAZIDE) Take 1 tablet by mouth every morning  #30 x 3   Entered and Authorized by:   Almyra Deforest MD   Signed by:   Almyra Deforest MD on 10/06/2009   Method used:   Print then Give to Patient   RxID:   [id number] GUAIATUSSIN AC 100-10 MG/5ML SYRP (GUAIFENESIN-CODEINE) Take one teaspoonful every 4 hours as needed for cough  #1 x 2   Entered and Authorized by:   Almyra Deforest MD   Signed by:   Almyra Deforest MD on 10/06/2009   Method used:   Print then Give to Patient   RxID:   [id number]   Prevention & Chronic Care Immunizations   Influenza vaccine: Fluvax Non-MCR  (10/05/2009)   Influenza vaccine  deferral: Deferred  (10/10/2008)    Tetanus booster: Not documented    Pneumococcal vaccine: Pneumovax  (02/17/2009)    H. zoster vaccine: Not documented  Colorectal Screening   Hemoccult: Not documented    Colonoscopy: Multiple polys up to 15mm.  Tubular adenomas and hyperplastic polyps.  No dysplasia nor malignancy.  Repeat colonoscopy 5/14  (06/11/2009)   Colonoscopy action/deferral: GI referral  (05/14/2009)   Colonoscopy due: 06/11/2012  Other Screening   PSA: Not documented   PSA action/deferral: Discussed-decision deferred  (03/25/2009)   Smoking status: current  (10/05/2009)   Smoking cessation counseling: yes  (10/05/2009)  Lipids   Total Cholesterol: 147  (10/10/2008)   Lipid panel action/deferral: Lipid Panel ordered   LDL: 75  (10/10/2008)   LDL Direct: Not documented   HDL: 34  (10/10/2008)   Triglycerides: 188  (10/10/2008)  Hypertension   Last Blood Pressure: 121 / 76  (10/05/2009)   Serum creatinine: 1.11  (08/21/2009)   Serum potassium 3.7  (08/21/2009)  Self-Management Support :   Personal Goals (by the next clinic visit) :      Personal blood pressure goal: 140/90  (03/12/2009)   Patient will work on the following items until the next clinic visit to reach self-care goals:     Medications and monitoring: take my medicines every day, check my blood pressure  (10/05/2009)     Eating: use fresh or frozen vegetables, eat foods that are low in salt  (10/05/2009)     Activity: take a 30 minute walk every day  (10/05/2009)    Hypertension self-management support: Written self-care plan  (10/05/2009)   Hypertension self-care plan printed.  Process Orders Check Orders Results:     [facility]: Order checked:     [id number] --  T-Vitamin B12 -- ABN required due to diagnosis (CPT: [phone]) Tests Sent for requisitioning (October 11, 2009 10:42 PM):     10/05/2009: [facility] -- T-Basic Metabolic Panel [phone] (signed)      10/05/2009: [facility] -- T-Vitamin B12 [phone] (signed)      Influenza Vaccine    Vaccine Type: Fluvax Non-MCR    Site: left deltoid    Mfr: [email or web address]    Dose: 0.5 ml    Route: IM    Given by: Chinita Pester RN    Exp. Date: 07/24/2010    Lot #: [id number]    VIS given: 08/18/09 version given October 05, 2009.  Flu Vaccine Consent Questions    Do you have a history of severe allergic reactions to this vaccine? no    Any prior history of allergic reactions to egg and/or gelatin? no    Do you have a sensitivity to the preservative Thimersol? no    Do you have a past history of Guillan-Barre Syndrome? no    Do you currently have an acute febrile illness? no    Have you ever had a severe reaction to latex? no    Vaccine information given and explained to patient? yes

## 2010-02-23 NOTE — Assessment & Plan Note (Signed)
Summary: EST-CK/FU/MEDS/CFB   Vital Signs:  Patient profile:   65 year old male Height:      69 inches (175.26 cm) Weight:      245.8 pounds (113.64 kg) BMI:     37.05 Temp:     97.1 degrees F (36.17 degrees C) oral Pulse rate:   82 / minute BP sitting:   136 / 83  (right arm) Cuff size:   regular  Vitals Entered By: Theotis Barrio NT II (May 14, 2009 3:07 PM) Is Patient Diabetic? No Pain Assessment Patient in pain? yes     Location: BILATERAL LEG Intensity:      8-9 Type: sharp Onset of pain  FOR THE LAST 3-4 DAYS   / ESP. IN THE MORNING Nutritional Status BMI of > 30 = obese  Have you ever been in a relationship where you felt threatened, hurt or afraid?No   Does patient need assistance? Functional Status Self care Ambulation Normal   Primary Care Provider:  Jackson Latino MD   History of Present Illness: Pt is a 65 y/o with PMH of HTN, OA, COPD, tabacco abuse comes in for bilateral knee pain and medication refills. He has bilateral knee pain for several years and recently has been worseing for several days, vicodin or ibuprofen does not help for his pain. He still has exertional SOB, no CP, fever. He is a current smoker 1 1/2 PPD, no drug use.  He has good appetite, no melana or dysuria. He also has ED wand wants to try viagra.  Preventive Screening-Counseling & Management  Alcohol-Tobacco     Smoking Cessation Counseling: yes  Problems Prior to Update: 1)  Erectile Dysfunction, Secondary To Medication  (ICD-607.84) 2)  Dizziness  (ICD-780.4) 3)  Otitis Externa  (ICD-380.10) 4)  Depression/anxiety  (ICD-300.4) 5)  Essential Hypertension, Benign  (ICD-401.1) 6)  Osteoarthritis, Multiple Joints  (ICD-715.89) 7)  Hypertrophy Prostate W/ur Obst & Oth Luts  (ICD-600.01) 8)  Preventive Health Care  (ICD-V70.0) 9)  COPD  (ICD-496) 10)  Gerd  (ICD-530.81) 11)  Dyspnea On Exertion  (ICD-786.09) 12)  Adjustment Disorder  (ICD-309.9) 13)  Hearing Loss,  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 Use 1-2 Puffs Every 4 Hours As Needed For Shortness of Breath or Wheezing 2)  Spiriva Handihaler 18 Mcg Caps (Tiotropium Bromide Monohydrate) .Marland Kitchen.. 1 Puff Daily 3)  Advair Diskus 250-50 Mcg/dose Misc (Fluticasone-Salmeterol) .... 2 Puff Two Times A Day 4)  Cardura 4 Mg Tabs (Doxazosin Mesylate) .... Take 1 Tablet By Mouth Once A Day 5)  Ciprodex 0.3-0.1 % Susp (Ciprofloxacin-Dexamethasone) .... 4 Drops To Affected Ear 4 Times Daily. 6)  Alprazolam 0.5 Mg Tabs (Alprazolam) .... Take 1 Tablet By Mouth Two Times A Day As Needed For Anxiety. 7)  Hydrochlorothiazide 25 Mg Tabs (Hydrochlorothiazide) .... Take 1 Tablet By Mouth Every Morning 8)  Fluoxetine Hcl 20 Mg Caps (Fluoxetine Hcl) .... Take 1 Tablet By Mouth Once A Day  Current Medications (verified): 1)  Ventolin Hfa 108 (90 Base) Mcg/act Aers (Albuterol Sulfate) .... Use 1-2 Puffs Every 4 Hours As Needed For Shortness of Breath or Wheezing 2)  Spiriva Handihaler 18 Mcg Caps (Tiotropium Bromide Monohydrate) .Marland Kitchen.. 1 Puff Daily 3)  Advair Diskus 250-50 Mcg/dose Misc (Fluticasone-Salmeterol) .... 2 Puff Two Times A Day 4)  Cardura 4 Mg Tabs (Doxazosin Mesylate) .... Take 1 Tablet By Mouth Once A  Day 5)  Ciprodex 0.3-0.1 % Susp (Ciprofloxacin-Dexamethasone) .... 4 Drops To Affected Ear 4 Times Daily. 6)  Alprazolam 0.5 Mg Tabs (Alprazolam) .... Take 1 Tablet By Mouth Two Times A Day As Needed For Anxiety. 7)  Hydrochlorothiazide 25 Mg Tabs (Hydrochlorothiazide) .... Take 1 Tablet By Mouth Every Morning 8)  Fluoxetine Hcl 20 Mg Caps (Fluoxetine Hcl) .... Take 1 Tablet By Mouth Once A Day 9)  Guaiatussin Ac 100-10 Mg/82ml Syrp (Guaifenesin-Codeine) .... Take One Teaspoonful Every 4 Hours As Needed For Cough  Allergies (verified): No Known Drug Allergies  Past History:  Past Medical  History: Last updated: 10/19/2007 Right knee trouble  Social History: Last updated: 02/17/2009 Retired Alcohol use-yes, 4 beers every day Current Smoker: 2 PPD Married, but separated  Risk Factors: Smoking Status: current (03/25/2009) Packs/Day: 1 1/2 ppd (03/25/2009)  Social History: Reviewed history from 02/17/2009 and no changes required. Retired Alcohol use-yes, 4 beers every day Current Smoker: 2 PPD Married, but separated  Review of Systems       The patient complains of dyspnea on exertion and prolonged cough.  The patient denies syncope, peripheral edema, headaches, hemoptysis, abdominal pain, hematuria, and muscle weakness.    Physical Exam  General:  alert, well-developed, well-nourished, well-hydrated, and overweight-appearing.   Head:  normocephalic.   Ears:  no external deformities.   Nose:  no external erythema.   Mouth:  pharynx pink and moist.   Neck:  supple.   Lungs:  Bilateral mild wheezing. normal respiratory effort, no intercostal retractions, no accessory muscle use, and no crackles.   Heart:  normal rate, regular rhythm, no murmur, and no JVD.   Abdomen:  soft, non-tender, normal bowel sounds, and no distention.   Msk:  Bilateral knee medial and lateral tenderness, but no redness or swelling.  Pulses:  2+ Extremities:  No edema.  Neurologic:  alert & oriented X3, cranial nerves II-XII intact, strength normal in all extremities, and gait normal.     Impression & Recommendations:  Problem # 1:  OSTEOARTHRITIS, MULTIPLE JOINTS (ICD-715.89) Assessment Deteriorated He has worsening bilateral knee pain due to OA and not responsive well to vicodin or ibuprofen. Will have sports medicine referral for possible steroid injection. Also give him percocet before he sees Sports medicine and advised to heating/ice pad.    Orders: Sports Medicine (Sports Med)  His updated medication list for this problem includes:    Percocet 5-325 Mg Tabs  (Oxycodone-acetaminophen) .Marland Kitchen... Take 1 tablet by mouth every 6 hours as needed for knee pain  Problem # 2:  ESSENTIAL HYPERTENSION, BENIGN (ICD-401.1) Assessment: Improved His BP better controlled and at the goal. Will continue current meds. His updated medication list for this problem includes:    Cardura 4 Mg Tabs (Doxazosin mesylate) .Marland Kitchen... Take 1 tablet by mouth once a day    Hydrochlorothiazide 25 Mg Tabs (Hydrochlorothiazide) .Marland Kitchen... Take 1 tablet by mouth every morning  BP today: 136/83 Prior BP: 151/88 (03/25/2009)  Labs Reviewed: K+: 4.3 (03/25/2009) Creat: : 0.95 (03/25/2009)   Chol: 147 (10/10/2008)   HDL: 34 (10/10/2008)   LDL: 75 (10/10/2008)   TG: 188 (10/10/2008)  Problem # 3:  COPD (ICD-496) Assessment: Unchanged Has been stable on current meds. Advised smoking cessation and this will be helpful to his COPD.  His updated medication list for this problem includes:    Ventolin Hfa 108 (90 Base) Mcg/act Aers (Albuterol sulfate) ..... Use 1-2 puffs every 4 hours as needed for shortness of breath or wheezing  Spiriva Handihaler 18 Mcg Caps (Tiotropium bromide monohydrate) .Marland Kitchen... 1 puff daily    Advair Diskus 250-50 Mcg/dose Misc (Fluticasone-salmeterol) .Marland Kitchen... 2 puff two times a day  Pulmonary Functions Reviewed: O2 sat: 97 (03/25/2009)     Vaccines Reviewed: Pneumovax: Pneumovax (02/17/2009)   Flu Vax: Fluvax 3+ (12/25/2008)  Problem # 4:  PREVENTIVE HEALTH CARE (ICD-V70.0) Assessment: Comment Only He has not done colonoscopy and will have GI referral for screening colonoscopy.  Orders: Gastroenterology Referral (GI)  Reviewed preventive care protocols, scheduled due services, and updated immunizations.  Problem # 5:  ERECTILE DYSFUNCTION, SECONDARY TO MEDICATION (NWG-956.21) Assessment: Unchanged He wants to try viagra for his ED and will prescribe this for him to see the response.  His updated medication list for this problem includes:    Viagra 50 Mg Tabs  (Sildenafil citrate) .Marland Kitchen... Take as directed  Discussed proper use of medications, as well as side effects.   Problem # 6:  TOBACCO ABUSE (ICD-305.1) Assessment: Comment Only  Encouraged smoking cessation and discussed different methods for smoking cessation. He would like to consider cutting his smoking  Complete Medication List: 1)  Ventolin Hfa 108 (90 Base) Mcg/act Aers (Albuterol sulfate) .... Use 1-2 puffs every 4 hours as needed for shortness of breath or wheezing 2)  Spiriva Handihaler 18 Mcg Caps (Tiotropium bromide monohydrate) .Marland Kitchen.. 1 puff daily 3)  Advair Diskus 250-50 Mcg/dose Misc (Fluticasone-salmeterol) .... 2 puff two times a day 4)  Cardura 4 Mg Tabs (Doxazosin mesylate) .... Take 1 tablet by mouth once a day 5)  Ciprodex 0.3-0.1 % Susp (Ciprofloxacin-dexamethasone) .... 4 drops to affected ear 4 times daily. 6)  Alprazolam 0.5 Mg Tabs (Alprazolam) .... Take 1 tablet by mouth two times a day as needed for anxiety. 7)  Hydrochlorothiazide 25 Mg Tabs (Hydrochlorothiazide) .... Take 1 tablet by mouth every morning 8)  Fluoxetine Hcl 20 Mg Caps (Fluoxetine hcl) .... Take 1 tablet by mouth once a day 9)  Guaiatussin Ac 100-10 Mg/83ml Syrp (Guaifenesin-codeine) .... Take one teaspoonful every 4 hours as needed for cough 10)  Viagra 50 Mg Tabs (Sildenafil citrate) .... Take as directed 11)  Percocet 5-325 Mg Tabs (Oxycodone-acetaminophen) .... Take 1 tablet by mouth every 6 hours as needed for knee pain  Patient Instructions: 1)  Please schedule a follow-up appointment in 6 months. 2)  Tobacco is very bad for your health and your loved ones! You Should stop smoking!. 3)  Stop Smoking Tips: Choose a Quit date. Cut down before the Quit date. decide what you will do as a substitute when you feel the urge to smoke(gum,toothpick,exercise). Prescriptions: VIAGRA 50 MG TABS (SILDENAFIL CITRATE) Take as directed  #10 x 3   Entered and Authorized by:   Jackson Latino MD   Signed by:    Jackson Latino MD on 05/14/2009   Method used:   Print then Give to Patient   RxID:   [id number] PERCOCET 5-325 MG TABS (OXYCODONE-ACETAMINOPHEN) Take 1 tablet by mouth every 6 hours as needed for knee pain  #30 x 0   Entered and Authorized by:   Jackson Latino MD   Signed by:   Jackson Latino MD on 05/14/2009   Method used:   Print then Give to Patient   RxID:   [id number] GUAIATUSSIN AC 100-10 MG/5ML SYRP (GUAIFENESIN-CODEINE) Take one teaspoonful every 4 hours as needed for cough  #1 x 3   Entered and Authorized by:   Jackson Latino MD   Signed by:   Marlene Lard  Threasa Beards MD on 05/14/2009   Method used:   Print then Give to Patient   RxID:   [id number] ALPRAZOLAM 0.5 MG TABS (ALPRAZOLAM) Take 1 tablet by mouth two times a day as needed for anxiety.  #60 x 0   Entered and Authorized by:   Jackson Latino MD   Signed by:   Jackson Latino MD on 05/14/2009   Method used:   Print then Give to Patient   RxID:   [id number] HYDROCHLOROTHIAZIDE 25 MG TABS (HYDROCHLOROTHIAZIDE) Take 1 tablet by mouth every morning  #30 x 6   Entered and Authorized by:   Jackson Latino MD   Signed by:   Jackson Latino MD on 05/14/2009   Method used:   Faxed to ...       [facility] (retail)       [address] [place], [place]  [address]       Ph: [phone]       Fax: [phone]   RxID:   [id number] ADVAIR DISKUS 250-50 MCG/DOSE MISC (FLUTICASONE-SALMETEROL) 2 puff two times a day  #1 x 6   Entered and Authorized by:   Jackson Latino MD   Signed by:   Jackson Latino MD on 05/14/2009   Method used:   Faxed to ...       [facility] (retail)       [address] [place], [place]  [address]       Ph: [phone]       Fax: [phone]   RxID:   [id number]    Prevention & Chronic Care Immunizations   Influenza vaccine: Fluvax 3+  (12/25/2008)   Influenza vaccine deferral: Deferred  (10/10/2008)     Tetanus booster: Not documented    Pneumococcal vaccine: Pneumovax  (02/17/2009)    H. zoster vaccine: Not documented  Colorectal Screening   Hemoccult: Not documented    Colonoscopy: Not documented   Colonoscopy action/deferral: GI referral  (05/14/2009)  Other Screening   PSA: Not documented   PSA action/deferral: Discussed-decision deferred  (03/25/2009)   Smoking status: current  (03/25/2009)   Smoking cessation counseling: yes  (05/14/2009)  Lipids   Total Cholesterol: 147  (10/10/2008)   Lipid panel action/deferral: Lipid Panel ordered   LDL: 75  (10/10/2008)   LDL Direct: Not documented   HDL: 34  (10/10/2008)   Triglycerides: 188  (10/10/2008)  Hypertension   Last Blood Pressure: 136 / 83  (05/14/2009)   Serum creatinine: 0.95  (03/25/2009)   Serum potassium 4.3  (03/25/2009)    Hypertension flowsheet reviewed?: Yes   Progress toward BP goal: At goal  Self-Management Support :   Personal Goals (by the next clinic visit) :      Personal blood pressure goal: 140/90  (03/12/2009)   Patient will work on the following items until the next clinic visit to reach self-care goals:     Medications and monitoring: take my medicines every day  (05/14/2009)     Eating: eat more vegetables  (05/14/2009)     Activity: take a 30 minute walk every day  (05/14/2009)    Hypertension self-management support: Written self-care plan, Education handout, Resources for patients handout  (03/25/2009)

## 2010-02-23 NOTE — Progress Notes (Signed)
Summary: assist w/ meds/ hla  Phone Note Call from Patient   Summary of Call: requesting help w/ obtaining meds, informed of PPA and pt is interested, referral is called Initial call taken by: Marin Roberts RN,  Jun 01, 2009 5:06 PM

## 2010-02-23 NOTE — Progress Notes (Signed)
Summary: refill/ hla  Phone Note Refill Request Message from:  Patient on Jun 18, 2009 1:41 PM  Refills Requested: Medication #1:  ALPRAZOLAM 0.5 MG TABS Take 1 tablet by mouth two times a day as needed for anxiety.   Last Refilled: 4/21 Initial call taken by: Marin Roberts RN,  Jun 18, 2009 1:41 PM  Follow-up for Phone Call        Rx written Follow-up by: Acey Lav MD,  Jun 19, 2009 3:44 PM    Prescriptions: ALPRAZOLAM 0.5 MG TABS (ALPRAZOLAM) Take 1 tablet by mouth two times a day as needed for anxiety.  #60 x 0   Entered and Authorized by:   Acey Lav MD   Signed by:   Paulette Blanch Dam MD on 06/19/2009   Method used:   Printed then faxed to ...       [facility] [phone]* (retail)       [address]       [address]       [place], [place]  [address]       Ph: [phone]       Fax: [phone]   RxID:   [id number]   Appended Document: refill/ hla called to pharm

## 2010-02-23 NOTE — Progress Notes (Signed)
Summary: Prescription changes  Phone Note Refill Request Message from:  Pharmacy on February 19, 2009 3:56 PM  Refills Requested: Medication #1:  Sandria Senter ER 8-10 MG/5ML LQCR take 5ml by mouth two times a day as needed cough.  Medication #2:  TERAZOSIN HCL 2 MG CAPS take 1 capsule each night before bedtime. Call from pharmacy they do not carry the Terazosin HCL  or the Tussionex ER.  They do have the Cardura 8 mg tablets instead.  OK to take 1/2 tablet nightlyper Dr. Josem Kaufmann Tussionex to be changed to Robitussin [facility].    Method Requested: Fax to Local Pharmacy Initial call taken by: Angelina Ok RN,  February 19, 2009 3:59 PM  Follow-up for Phone Call        Medication lists updated to reflect the above. Follow-up by: Doneen Poisson MD,  February 19, 2009 4:08 PM    New/Updated Medications: ROBITUSSIN MAXIMUM STRENGTH 15 MG/5ML SYRP (DEXTROMETHORPHAN HBR) take as needed with cough Prescriptions: ROBITUSSIN MAXIMUM STRENGTH 15 MG/5ML SYRP (DEXTROMETHORPHAN HBR) take as needed with cough  #60 ml x 0   Entered and Authorized by:   Doneen Poisson MD   Signed by:   Doneen Poisson MD on 02/19/2009   Method used:   Historical   RxID:   [id number]

## 2010-02-23 NOTE — Progress Notes (Signed)
Summary: Medication  Phone Note From Pharmacy   Caller: [facility] Summary of Call: Call from pharmacy question about the frequency of pt's Advair.  Pharmacy can be reached at [phone]. Angelina Ok RN  Jun 12, 2009 9:39 AM  Initial call taken by: Angelina Ok RN,  Jun 12, 2009 9:39 AM  Follow-up for Phone Call        Phone call completed. Will try to change advair to 1 puff two times a day. Pharmacist was notified. Follow-up by: Jackson Latino MD,  Jun 13, 2009 9:48 PM    New/Updated Medications: ADVAIR DISKUS 250-50 MCG/DOSE MISC (FLUTICASONE-SALMETEROL) 1 puff two times a day

## 2010-02-23 NOTE — Progress Notes (Signed)
Summary: phone/gg  Phone Note Call from Patient   Caller: Patient Summary of Call: Caller: Spouse Summary of Call: Call from pt's wife stating pt was given morphine and he is allergic to morphine, causing itching.  She would like a Rx for vicodine like he used to take. Pt # [id number] Initial call taken by: Merrie Roof RN,  August 28, 2009 3:17 PM    Unable to reach Dr Gwenlyn Perking     Initial call taken by: Merrie Roof RN,  August 28, 2009 4:12 PM  Follow-up for Phone Call        Note from 7/29 said he had itching from percocet. That was why he was changed to Morphine Sulfate. According to our EMR, he has never been on vicodin. He can take benedryl over the counter and Dr Gwenlyn Perking can address on Monday or pt can come for acute appt on Monday but if he does he needs to bring all narc bottles. Follow-up by: Blanch Media MD,  August 28, 2009 5:01 PM  Additional Follow-up for Phone Call Additional follow up Details #1::        Pt informed and has benadryl at home. He still prefers vicodin which he has received -- last filled on 02/17/09 Will you write for the vicodin.   The Morphine Sulfate made him sleepy and caused itching. Additional Follow-up by: Merrie Roof RN,  August 28, 2009 5:12 PM    Additional Follow-up for Phone Call Additional follow up Details #2::    He was not only itching with percocet, but unable to get any relief of his pain. if he asking to continue on Ms contin two times a day and get a prescription for breakthrough vicodin; or he will like just plain vicodin???? Knowing this will help calculating how much tablets he would need and which strenght would be better for him.

## 2010-02-23 NOTE — Assessment & Plan Note (Signed)
Summary: FU/SB. Threasa Beards)   Vital Signs:  Patient profile:   65 year old male Height:      69 inches (175.26 cm) Weight:      250.0 pounds (113.64 kg) BMI:     37.05 O2 Sat:      97 % on Room air Temp:     97.0 degrees F (36.11 degrees C) oral Pulse rate:   66 / minute Pulse (ortho):   66 / minute BP sitting:   160 / 90  (right arm) BP standing:   151 / 88  Vitals Entered By: Stanton Kidney Ditzler RN (March 25, 2009 1:39 PM)  O2 Flow:  Room air  Serial Vital Signs/Assessments:  Time      Position  BP       Pulse  Resp  Temp     By 2:05PM    Lying RA  164/84   63                    Debra Ditzler RN 2:05PM    Sitting   158/88   65                    Debra Ditzler RN 2:05PM    Standing  151/88   66                    Debra Ditzler RN  Is Patient Diabetic? No Pain Assessment Patient in pain? yes     Location: right leg Intensity: 6 Type: hurts Onset of pain  2-3 years Nutritional Status BMI of > 30 = obese Nutritional Status Detail appetite good  Have you ever been in a relationship where you felt threatened, hurt or afraid?denies   Does patient need assistance? Functional Status Self care Ambulation Impaired:Risk for fall Comments Girlfriend with pt and uses a cane. FU BP and refills on meds. Discuss chol and sex desires down.   Primary Care Provider:  Jackson Latino MD   History of Present Illness: Pt is a 65 yo male accompanied by his significant other w/ past med hx below here for f/u of BP and refills on meds.  Dizziness has resolved since his last office visit after changing his cardura dose.  He is upset b/c he had an episode where he could not get an erection.  He is dose not have any am erections in the morning and has not had these for years.  He is still feeling depressed but does feel like the celexa is helping and his significant other notes his mood is much better.  No problems urinating.    Depression History:      The patient denies a depressed mood most of the  day and a diminished interest in his usual daily activities.         Preventive Screening-Counseling & Management  Alcohol-Tobacco     Alcohol type: BEER / EVERYDAY SOMETIMES     Smoking Status: current     Smoking Cessation Counseling: yes     Smoke Cessation Stage: ready     Packs/Day: 1 1/2 ppd     Year Started: ABOUT THE AGE OF 16  Caffeine-Diet-Exercise     Does Patient Exercise: no  Current Medications (verified): 1)  Ventolin Hfa 108 (90 Base) Mcg/act Aers (Albuterol Sulfate) .... Use 1-2 Puffs Every 4 Hours As Needed For Shortness of Breath or Wheezing 2)  Spiriva Handihaler 18 Mcg Caps (Tiotropium Bromide Monohydrate) .Marland Kitchen.. 1 Puff  Daily 3)  Advair Diskus 250-50 Mcg/dose Misc (Fluticasone-Salmeterol) .... 2 Puff Two Times A Day 4)  Cardura 4 Mg Tabs (Doxazosin Mesylate) .... Take 1 Tablet By Mouth Once A Day 5)  Celexa 20 Mg Tabs (Citalopram Hydrobromide) .... Take 1 Tablet By Mouth Once A Day 6)  Ciprodex 0.3-0.1 % Susp (Ciprofloxacin-Dexamethasone) .... 4 Drops To Affected Ear 4 Times Daily. 7)  Alprazolam 0.5 Mg Tabs (Alprazolam) .... Take 1 Tablet By Mouth Two Times A Day As Needed For Anxiety.  Allergies (verified): No Known Drug Allergies  Past History:  Past Medical History: Last updated: 10/19/2007 Right knee trouble  Social History: Last updated: 02/17/2009 Retired Alcohol use-yes, 4 beers every day Current Smoker: 2 PPD Married, but separated  Risk Factors: Smoking Status: current (03/25/2009) Packs/Day: 1 1/2 ppd (03/25/2009)  Social History: Reviewed history from 02/17/2009 and no changes required. Retired Alcohol use-yes, 4 beers every day Current Smoker: 2 PPD Married, but separated Packs/Day:  1 1/2 ppd  Review of Systems       As per HPI.  Physical Exam  General:  alert, oriented, no distress. Eyes:  anicteric Lungs:  CTA bilaterally, no wheezing. Heart:  RRR, no m/r/g. Abdomen:  +BS's, soft, NT and ND. Genitalia:  uncircumsized,  no testicular masses, no abnormalities. Pulses:  femoral pulses are normal bilaterally Extremities:  no edema Psych:  mood euthymic.   Impression & Recommendations:  Problem # 1:  ESSENTIAL HYPERTENSION, BENIGN (ICD-401.1) Pt not orthostatic, will start HCTZ, f/u BP and BMET in 1 month.  His updated medication list for this problem includes:    Cardura 4 Mg Tabs (Doxazosin mesylate) .Marland Kitchen... Take 1 tablet by mouth once a day    Hydrochlorothiazide 25 Mg Tabs (Hydrochlorothiazide) .Marland Kitchen... Take 1 tablet by mouth every morning  Orders: T-Basic Metabolic Panel [id number])  Problem # 2:  DIZZINESS (ICD-780.4) Resolved after cardura dose decreased.   Problem # 3:  ERECTILE DYSFUNCTION, SECONDARY TO MEDICATION (UJW-119.14) This is likely related to medication since it started after SSRI was started and this is a known side effec,t along with his other meds.  Also may be performance anxiety as he makes it clear he has never had this problem before.  Depression could contribute as well.  Will hold on checking testosterone level at this time.  Discussed possibility of stopping celexa and they were hesitant to doing this b/c his mood is so much better.  Hopefully this will be a self limited problem.  If symptoms persist, consider further workup and trial of viagra.  Problem # 4:  DEPRESSION/ANXIETY (ICD-300.4) Markedly improved w/ celexa per pt and his spouse's hx.   Problem # 5:  COPD (ICD-496) Will refill his meds.  Not wheezing on exam today.  His updated medication list for this problem includes:    Ventolin Hfa 108 (90 Base) Mcg/act Aers (Albuterol sulfate) ..... Use 1-2 puffs every 4 hours as needed for shortness of breath or wheezing    Spiriva Handihaler 18 Mcg Caps (Tiotropium bromide monohydrate) .Marland Kitchen... 1 puff daily    Advair Diskus 250-50 Mcg/dose Misc (Fluticasone-salmeterol) .Marland Kitchen... 2 puff two times a day  Complete Medication List: 1)  Ventolin Hfa 108 (90 Base) Mcg/act Aers  (Albuterol sulfate) .... Use 1-2 puffs every 4 hours as needed for shortness of breath or wheezing 2)  Spiriva Handihaler 18 Mcg Caps (Tiotropium bromide monohydrate) .Marland Kitchen.. 1 puff daily 3)  Advair Diskus 250-50 Mcg/dose Misc (Fluticasone-salmeterol) .... 2 puff two times a day 4)  Cardura 4 Mg Tabs (Doxazosin mesylate) .... Take 1 tablet by mouth once a day 5)  Celexa 20 Mg Tabs (Citalopram hydrobromide) .... Take 1 tablet by mouth once a day 6)  Ciprodex 0.3-0.1 % Susp (Ciprofloxacin-dexamethasone) .... 4 drops to affected ear 4 times daily. 7)  Alprazolam 0.5 Mg Tabs (Alprazolam) .... Take 1 tablet by mouth two times a day as needed for anxiety. 8)  Hydrochlorothiazide 25 Mg Tabs (Hydrochlorothiazide) .... Take 1 tablet by mouth every morning  Patient Instructions: 1)  Please make a followup appointment in 1-2 months to check your blood pressure. 2)  Please decrease alprazolam to once daily. 3)  Please try to quit smoking.  You can try calling 1-800-QUIT-NOW which is a free hotline to help you quit smoking.  4)  Please start your new blood pressure medication.  Prescriptions: CELEXA 20 MG TABS (CITALOPRAM HYDROBROMIDE) Take 1 tablet by mouth once a day  #30 x 6   Entered and Authorized by:   Joaquin Courts  MD   Signed by:   Joaquin Courts  MD on 03/25/2009   Method used:   Print then Give to Patient   RxID:   [id number] CARDURA 4 MG TABS (DOXAZOSIN MESYLATE) Take 1 tablet by mouth once a day  #30 x 6   Entered and Authorized by:   Joaquin Courts  MD   Signed by:   Joaquin Courts  MD on 03/25/2009   Method used:   Print then Give to Patient   RxID:   [id number] VENTOLIN HFA 108 (90 BASE) MCG/ACT AERS (ALBUTEROL SULFATE) use 1-2 puffs every 4 hours as needed for shortness of breath or wheezing  #1 x 6   Entered and Authorized by:   Joaquin Courts  MD   Signed by:   Joaquin Courts  MD on 03/25/2009   Method used:   Print then Give to Patient   RxID:    [id number] HYDROCHLOROTHIAZIDE 25 MG TABS (HYDROCHLOROTHIAZIDE) Take 1 tablet by mouth every morning  #30 x 1   Entered and Authorized by:   Joaquin Courts  MD   Signed by:   Joaquin Courts  MD on 03/25/2009   Method used:   Print then Give to Patient   RxID:   [id number]  Process Orders Check Orders Results:     [facility]: ABN not required for this insurance Tests Sent for requisitioning (March 25, 2009 5:03 PM):     03/25/2009: [facility] -- T-Basic Metabolic Panel [phone] (signed)    Prevention & Chronic Care Immunizations   Influenza vaccine: Fluvax 3+  (12/25/2008)   Influenza vaccine deferral: Deferred  (10/10/2008)    Tetanus booster: Not documented    Pneumococcal vaccine: Pneumovax  (02/17/2009)    H. zoster vaccine: Not documented  Colorectal Screening   Hemoccult: Not documented    Colonoscopy: Not documented   Colonoscopy action/deferral: Deferred  (03/12/2009)  Other Screening   PSA: Not documented   PSA action/deferral: Discussed-decision deferred  (03/25/2009)   Smoking status: current  (03/25/2009)   Smoking cessation counseling: yes  (03/25/2009)  Lipids   Total Cholesterol: 147  (10/10/2008)   Lipid panel action/deferral: Lipid Panel ordered   LDL: 75  (10/10/2008)   LDL Direct: Not documented   HDL: 34  (10/10/2008)   Triglycerides: 188  (10/10/2008)  Hypertension   Last Blood Pressure: 160 / 90  (03/25/2009)   Serum creatinine: 1.06  (10/10/2008)   Serum potassium 4.0  (10/10/2008)  Hypertension flowsheet reviewed?: Yes   Progress toward BP goal: Deteriorated  Self-Management Support :   Personal Goals (by the next clinic visit) :      Personal blood pressure goal: 140/90  (03/12/2009)   Patient will work on the following items until the next clinic visit to reach self-care goals:     Medications and monitoring: take my medicines every day  (03/25/2009)     Eating: eat more  vegetables, use fresh or frozen vegetables, eat foods that are low in salt, eat fruit for snacks and desserts  (03/25/2009)     Activity: take a 30 minute walk every day  (03/25/2009)    Hypertension self-management support: Written self-care plan, Education handout, Resources for patients handout  (03/25/2009)   Hypertension self-care plan printed.   Hypertension education handout printed      Resource handout printed.

## 2010-02-23 NOTE — Assessment & Plan Note (Signed)
Summary: EST-1 MONTH F/U VISIT/CH   Vital Signs:  Patient profile:   65 year old male Weight:      248.2 pounds (112.82 kg) Temp:     98.6 degrees F (37.00 degrees C) oral Pulse rate:   79 / minute BP sitting:   133 / 80  (left arm) Cuff size:   regular  Vitals Entered By: Cynda Familia Duncan Dull) (December 23, 2009 9:36 AM) CC: f/u itching, pt states this has been an ongoing problem for about , Depression Is Patient Diabetic? No Pain Assessment Patient in pain? no       Have you ever been in a relationship where you felt threatened, hurt or afraid?No   Does patient need assistance? Functional Status Self care   Primary Care Provider:  Jackson Latino MD  CC:  f/u itching, pt states this has been an ongoing problem for about , and Depression.  History of Present Illness: Pt is a 65 y/o with PMH of HTN, anxiety, COPD, skin itching, tabacco abuse comes in for regular f/u for itching. He still has itching on current medications, no significant improvement. No rash or fever.  denies CP, SOB or palpitation.  His depression better controlled, no SI/HI. He has good appetite, no abdominal pain, melana or dysuria. He has gained 3 lbs since last visit. Current smoker 1 PPD, ETOH or drug.   Depression History:      The patient denies a depressed mood most of the day and a diminished interest in his usual daily activities.         Preventive Screening-Counseling & Management  Alcohol-Tobacco     Alcohol type: BEER / EVERYDAY SOMETIMES     Smoking Status: current     Smoking Cessation Counseling: yes     Smoke Cessation Stage: ready     Packs/Day: 1 ppd     Year Started: ABOUT THE AGE OF 16  Problems Prior to Update: 1)  Transaminases, Serum, Elevated  (ICD-790.4) 2)  Pruritus  (ICD-698.9) 3)  Erectile Dysfunction, Secondary To Medication  (ICD-607.84) 4)  Depression/anxiety  (ICD-300.4) 5)  Essential Hypertension, Benign  (ICD-401.1) 6)  Osteoarthritis, Multiple  Joints  (ICD-715.89) 7)  Hypertrophy Prostate W/ur Obst & Oth Luts  (ICD-600.01) 8)  Preventive Health Care  (ICD-V70.0) 9)  COPD  (ICD-496) 10)  Gerd  (ICD-530.81) 11)  Adjustment Disorder  (ICD-309.9) 12)  Hearing Loss, 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 Use 1-2 Puffs Every 4 Hours As Needed For Shortness of Breath or Wheezing 2)  Spiriva Handihaler 18 Mcg Caps (Tiotropium Bromide Monohydrate) .Marland Kitchen.. 1 Puff Daily 3)  Advair Diskus 250-50 Mcg/dose Misc (Fluticasone-Salmeterol) .Marland Kitchen.. 1 Puff Two Times A Day 4)  Alprazolam 0.5 Mg Tabs (Alprazolam) .... Take 1 Tablet By Mouth Three Times A Day As Needed For Anxiety. 5)  Hydrochlorothiazide 25 Mg Tabs (Hydrochlorothiazide) .... Take 1 Tablet By Mouth Every Morning 6)  Guaiatussin Ac 100-10 Mg/34ml Syrp (Guaifenesin-Codeine) .... Take One Teaspoonful Every 4 Hours As Needed For Cough 7)  Tramadol Hcl 50 Mg Tabs (Tramadol Hcl) .... Take 1 Tablet By Mouth Three Times A Day For Pain. 8)  Hydroxyzine Hcl 50 Mg Tabs (Hydroxyzine Hcl) .... Take 1 Tablet By Mouth Three Times A Day As Needed For Itching 9)  Benadryl Maximum Strength 2 % Crea (Diphenhydramine Hcl) .... Use As Instructed 10)  Zyrtec Hives Relief 10  Mg Tabs (Cetirizine Hcl) .... Take 1 Tablet By Mouth Once A Day 11)  Aspir-Low 81 Mg Tbec (Aspirin) .... Take 1 Tablet By Mouth Once A Day  Current Medications (verified): 1)  Ventolin Hfa 108 (90 Base) Mcg/act Aers (Albuterol Sulfate) .... Use 1-2 Puffs Every 4 Hours As Needed For Shortness of Breath or Wheezing 2)  Spiriva Handihaler 18 Mcg Caps (Tiotropium Bromide Monohydrate) .Marland Kitchen.. 1 Puff Daily 3)  Advair Diskus 250-50 Mcg/dose Misc (Fluticasone-Salmeterol) .Marland Kitchen.. 1 Puff Two Times A Day 4)  Alprazolam 0.5 Mg Tabs (Alprazolam) .... Take 1 Tablet By Mouth Three Times A Day As Needed For Anxiety. 5)  Hydrochlorothiazide 25 Mg  Tabs (Hydrochlorothiazide) .... Take 1 Tablet By Mouth Every Morning 6)  Guaiatussin Ac 100-10 Mg/73ml Syrp (Guaifenesin-Codeine) .... Take One Teaspoonful Every 4 Hours As Needed For Cough 7)  Tramadol Hcl 50 Mg Tabs (Tramadol Hcl) .... Take 1 Tablet By Mouth Three Times A Day For Pain. 8)  Hydroxyzine Hcl 50 Mg Tabs (Hydroxyzine Hcl) .... Take 1 Tablet By Mouth Three Times A Day As Needed For Itching 9)  Benadryl Maximum Strength 2 % Crea (Diphenhydramine Hcl) .... Use As Instructed 10)  Zyrtec Hives Relief 10 Mg Tabs (Cetirizine Hcl) .... Take 1 Tablet By Mouth Once A Day 11)  Aspir-Low 81 Mg Tbec (Aspirin) .... Take 1 Tablet By Mouth Once A Day  Allergies: 1)  ! Celebrex 2)  ! Sulfa  Past History:  Past Medical History: Last updated: 10/19/2007 Right knee trouble  Social History: Last updated: 06/29/2009 Retired Alcohol use-yes, 4 beers every day Current Smoker: 1/2 PPD Married, but separated  Risk Factors: Smoking Status: current (12/23/2009) Packs/Day: 1 ppd (12/23/2009)  Family History: Reviewed history and no changes required.  Social History: Reviewed history from 06/29/2009 and no changes required. Retired Alcohol use-yes, 4 beers every day Current Smoker: 1/2 PPD Married, but separated  Review of Systems  The patient denies fever, weight loss, chest pain, syncope, dyspnea on exertion, peripheral edema, prolonged cough, headaches, hemoptysis, abdominal pain, melena, and hematochezia.    Physical Exam  General:  alert, well-developed, well-nourished, well-hydrated, and overweight-appearing.   Nose:  no nasal discharge.   Mouth:  pharynx pink and moist.   Neck:  supple.   Lungs:  normal respiratory effort, no accessory muscle use, normal breath sounds, and no crackles.  Mild wheezing.  Heart:  normal rate, regular rhythm, no murmur, and no JVD.   Abdomen:  soft, non-tender, normal bowel sounds, no distention, and no masses.   Msk:  normal ROM, no joint  tenderness, no joint swelling, and no joint warmth.   Extremities:  No edema.  Neurologic:  alert & oriented X3 and gait normal.     Impression & Recommendations:  Problem # 1:  PRURITUS (ICD-698.9) Assessment Unchanged The itching not significantly better with current meds, no rashes, and the causes are unclear. No new medication used before it happened. This could be dry skin, hepatobiliary disorders or unknow allergy vs infection such as scabies. Will d/o hydroxyzine, change to benadryl. Check HLP and will have dermatology referral if no improvement. Advised him for skin hyigene.  Orders: T-Hepatic Function [id number])  Problem # 2:  ESSENTIAL HYPERTENSION, BENIGN (ICD-401.1) Assessment: Unchanged BP well controlled and will continue them.  The following medications were removed from the medication list:    Hydrochlorothiazide 25 Mg Tabs (Hydrochlorothiazide) .Marland Kitchen... Take 1 tablet by mouth every morning  BP today: 133/80 Prior BP: 138/80 (12/02/2009)  Labs Reviewed:  K+: 3.6 (11/16/2009) Creat: : 1.28 (11/16/2009)   Chol: 147 (10/10/2008)   HDL: 34 (10/10/2008)   LDL: 75 (10/10/2008)   TG: 188 (10/10/2008)  Problem # 3:  COPD (ICD-496) Assessment: Improved Well controlled and continue current regimen. Advised cut the smoking.  His updated medication list for this problem includes:    Ventolin Hfa 108 (90 Base) Mcg/act Aers (Albuterol sulfate) ..... Use 1-2 puffs every 4 hours as needed for shortness of breath or wheezing    Spiriva Handihaler 18 Mcg Caps (Tiotropium bromide monohydrate) .Marland Kitchen... 1 puff daily    Advair Diskus 250-50 Mcg/dose Misc (Fluticasone-salmeterol) .Marland Kitchen... 1 puff two times a day  Pulmonary Functions Reviewed: O2 sat: 95 (12/02/2009)     Vaccines Reviewed: Pneumovax: Pneumovax (02/17/2009)   Flu Vax: Fluvax Non-MCR (10/05/2009)  Problem # 4:  TOBACCO ABUSE (ICD-305.1) Assessment: Comment Only  Encouraged smoking cessation and discussed different methods  for smoking cessation. He  said he will cut soon.  Complete Medication List: 1)  Ventolin Hfa 108 (90 Base) Mcg/act Aers (Albuterol sulfate) .... Use 1-2 puffs every 4 hours as needed for shortness of breath or wheezing 2)  Spiriva Handihaler 18 Mcg Caps (Tiotropium bromide monohydrate) .Marland Kitchen.. 1 puff daily 3)  Advair Diskus 250-50 Mcg/dose Misc (Fluticasone-salmeterol) .Marland Kitchen.. 1 puff two times a day 4)  Alprazolam 0.5 Mg Tabs (Alprazolam) .... Take 1 tablet by mouth three times a day as needed for anxiety. 5)  Guaiatussin Ac 100-10 Mg/28ml Syrp (Guaifenesin-codeine) .... Take one teaspoonful every 4 hours as needed for cough 6)  Tramadol Hcl 50 Mg Tabs (Tramadol hcl) .... Take 1 tablet by mouth three times a day for pain. 7)  Hydroxyzine Hcl 50 Mg Tabs (Hydroxyzine hcl) .... Take 1 tablet by mouth three times a day as needed for itching 8)  Benadryl Maximum Strength 2 % Crea (Diphenhydramine hcl) .... Use as instructed 9)  Zyrtec Hives Relief 10 Mg Tabs (Cetirizine hcl) .... Take 1 tablet by mouth once a day 10)  Aspir-low 81 Mg Tbec (Aspirin) .... Take 1 tablet by mouth once a day 11)  Diphenhydramine Hcl 50 Mg Tabs (Diphenhydramine hcl) .... Take 1 tablet by mouth three times a day as needed for itching  Patient Instructions: 1)  Please schedule a follow-up appointment in 3-4 weeks. 2)  Please try to keep your skin moisture and change clothes daily. 3)  Tobacco is very bad for your health and your loved ones! You Should stop smoking!. 4)  Stop Smoking Tips: Choose a Quit date. Cut down before the Quit date. decide what you will do as a substitute when you feel the urge to smoke(gum,toothpick,exercise). 5)  It is important that you exercise regularly at least 20 minutes 5 times a week. If you develop chest pain, have severe difficulty breathing, or feel very tired , stop exercising immediately and seek medical attention. 6)  You need to lose weight. Consider a lower calorie diet and regular exercise.   Prescriptions: DIPHENHYDRAMINE HCL 50 MG TABS (DIPHENHYDRAMINE HCL) Take 1 tablet by mouth three times a day as needed for itching  #90 x 1   Entered and Authorized by:   Jackson Latino MD   Signed by:   Jackson Latino MD on 12/23/2009   Method used:   Electronically to        [facility] [id number]* (retail)       [address]       [address]  [place], [place]  [address]       Ph: [phone]       Fax: [phone]   RxID:   [id number]    Orders Added: 1)  T-Hepatic Function [phone] 2)  Est. Patient Level IV [27253]    Prevention & Chronic Care Immunizations   Influenza vaccine: Fluvax Non-MCR  (10/05/2009)   Influenza vaccine deferral: Deferred  (10/10/2008)    Tetanus booster: Not documented    Pneumococcal vaccine: Pneumovax  (02/17/2009)    H. zoster vaccine: Not documented  Colorectal Screening   Hemoccult: Not documented   Hemoccult action/deferral: Not indicated  (12/02/2009)    Colonoscopy: Multiple polys up to 15mm.  Tubular adenomas and hyperplastic polyps.  No dysplasia nor malignancy.  Repeat colonoscopy 5/14  (06/11/2009)   Colonoscopy action/deferral: GI referral  (05/14/2009)   Colonoscopy due: 06/11/2012  Other Screening   PSA: Not documented   PSA action/deferral: Discussed-decision deferred  (03/25/2009)   Smoking status: current  (12/23/2009)   Smoking cessation counseling: yes  (12/23/2009)  Lipids   Total Cholesterol: 147  (10/10/2008)   Lipid panel action/deferral: Lipid Panel ordered   LDL: 75  (10/10/2008)   LDL Direct: Not documented   HDL: 34  (10/10/2008)   Triglycerides: 188  (10/10/2008)  Hypertension   Last Blood Pressure: 133 / 80  (12/23/2009)   Serum creatinine: 1.28  (11/16/2009)   Serum potassium 3.6  (11/16/2009)    Hypertension flowsheet reviewed?: Yes   Progress toward BP goal: At goal  Self-Management Support :   Personal Goals (by the next clinic visit) :      Personal blood  pressure goal: 140/90  (03/12/2009)   Patient will work on the following items until the next clinic visit to reach self-care goals:     Medications and monitoring: take my medicines every day  (12/23/2009)     Eating: eat foods that are low in salt, eat baked foods instead of fried foods  (12/23/2009)     Activity: take a 30 minute walk every day  (12/02/2009)    Hypertension self-management support: Written self-care plan  (12/23/2009)   Hypertension self-care plan printed.

## 2010-02-23 NOTE — Miscellaneous (Signed)
Summary: Orders Update  Clinical Lists Changes  Orders: Added new Referral order of Social Work Referral (Social ) - Signed 

## 2010-02-23 NOTE — Assessment & Plan Note (Signed)
Summary: weak, dizzy x 1 week/pcp-yang/hla   Vital Signs:  Patient profile:   65 year old male Height:      69 inches (175.26 cm) Weight:      237.9 pounds (107.45 kg) BMI:     35.04 Temp:     97.0 degrees F (36.11 degrees C) oral Pulse rate:   70 / minute BP sitting:   129 / 80  (right arm) Cuff size:   RGULAR  Vitals Entered By: Theotis Barrio NT II (August 21, 2009 10:12 AM) CC: PATIENT STATES HE HAS FEELINGS OF BEING "SCARED - DIZZY-" AFTER TAKING MEDICATIONS, HE STATES THAT HIS PAIN MED IS NOT STRONG ENOUGH AND HAS ITCHING AFTER TAKING THE PAIN MEDICATION., Depression Is Patient Diabetic? No Pain Assessment Patient in pain? yes     Location: B / LEGS Intensity:      5 Type: aching Onset of pain   CHRONIC Nutritional Status BMI of > 30 = obese  Have you ever been in a relationship where you felt threatened, hurt or afraid?No   Does patient need assistance? Functional Status Self care Ambulation Normal Comments FEELING OF BEING SCARED AND DIZZY - ITCHES AFTER TAKING HIS PAIN MED. PATIENT STATES HIS PAIN MED IS NOT ENOUGHJ   Primary Care Provider:  Jackson Latino MD  CC:  PATIENT STATES HE HAS FEELINGS OF BEING "SCARED - DIZZY-" AFTER TAKING MEDICATIONS, HE STATES THAT HIS PAIN MED IS NOT STRONG ENOUGH AND HAS ITCHING AFTER TAKING THE PAIN MEDICATION., and Depression.  History of Present Illness: 65 y/o male with pmh as describe din the EMR; who comes to the clinic complaining of increased depression and anxiety; feeling weak and also experiencing dizziness.  Patient has been taking more alprazolam that the one prescribed for anxiety, more percocet than usual for pain on his knee and due to itching after taking percocet has been taking benadryl over the counter (liquid) around the clock wish make him drowsy.  Patient reports that prozac is not working, he continue tobe depressed and is unable to identify why.  Depression History:      The patient is having a depressed  mood most of the day but denies diminished interest in his usual daily activities.  Positive alarm features for depression include hypersomnia, fatigue (loss of energy), and impaired concentration (indecisiveness).        The patient denies that he feels like life is not worth living, denies that he wishes that he were dead, and denies that he has thought about ending his life.        Comments:  DON'T KNOW WHY HE FEELS DEPRESSED.   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) 4)  Otitis Externa  (ICD-380.10) 5)  Depression/anxiety  (ICD-300.4) 6)  Essential Hypertension, Benign  (ICD-401.1) 7)  Osteoarthritis, Multiple Joints  (ICD-715.89) 8)  Hypertrophy Prostate W/ur Obst & Oth Luts  (ICD-600.01) 9)  Preventive Health Care  (ICD-V70.0) 10)  COPD  (ICD-496) 11)  Gerd  (  ICD-530.81) 12)  Dyspnea On Exertion  (ICD-786.09) 13)  Adjustment Disorder  (ICD-309.9) 14)  Hearing Loss, 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) 15)  Tobacco Abuse  (ICD-305.1)  Current Problems (verified): 1)  Erectile Dysfunction, 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 Use 1-2 Puffs Every 4 Hours As Needed For Shortness of Breath or Wheezing 2)  Spiriva Handihaler 18 Mcg Caps (Tiotropium Bromide Monohydrate) .Marland Kitchen.. 1 Puff Daily 3)  Advair Diskus 250-50 Mcg/dose Misc (Fluticasone-Salmeterol) .Marland Kitchen.. 1 Puff Two Times A Day 4)  Cardura 4 Mg Tabs (Doxazosin Mesylate) .... Take 1 Tablet By Mouth Once A Day 5)  Alprazolam 0.5 Mg Tabs (Alprazolam) .... Take 1 Tablet By Mouth Two Times A Day As Needed For Anxiety. 6)  Hydrochlorothiazide 25 Mg Tabs (Hydrochlorothiazide) .... Take 1 Tablet By Mouth Every Morning 7)  Fluoxetine Hcl 20 Mg Caps (Fluoxetine Hcl) .... Take 1 Tablet By Mouth Once A Day 8)  Guaiatussin Ac 100-10 Mg/79ml Syrp (Guaifenesin-Codeine) .... Take One Teaspoonful Every 4 Hours As Needed For Cough 9)  Viagra 50 Mg Tabs (Sildenafil Citrate) .... Take As Directed 10)  Percocet 5-325 Mg Tabs (Oxycodone-Acetaminophen) .... Take 1 Tablet By Mouth Every 6 Hours As Needed For Knee Pain  Current Medications (verified): 1)  Ventolin Hfa 108 (90 Base) Mcg/act Aers (Albuterol Sulfate) .... Use 1-2 Puffs Every 4 Hours As Needed For Shortness of Breath or Wheezing 2)  Spiriva Handihaler 18 Mcg Caps (Tiotropium Bromide Monohydrate) .Marland Kitchen.. 1 Puff Daily 3)  Advair Diskus 250-50 Mcg/dose Misc (Fluticasone-Salmeterol) .Marland Kitchen.. 1 Puff Two Times A Day 4)  Cardura 4 Mg Tabs (Doxazosin Mesylate) .... Take 1 Tablet By Mouth Once A Day 5)  Alprazolam 0.5 Mg Tabs (Alprazolam) .... Take 1 Tablet By Mouth Two Times A Day As Needed For Anxiety. 6)  Hydrochlorothiazide 25 Mg Tabs (Hydrochlorothiazide) .... Take 1 Tablet By Mouth Every Morning 7)  Fluoxetine Hcl 20 Mg Caps (Fluoxetine Hcl) .... Take 1 Tablet By Mouth Once A Day 8)  Guaiatussin Ac 100-10 Mg/53ml Syrp (Guaifenesin-Codeine) .... Take One Teaspoonful Every 4 Hours As  Needed For Cough 9)  Viagra 50 Mg Tabs (Sildenafil Citrate) .... Take As Directed 10)  Percocet 5-325 Mg Tabs (Oxycodone-Acetaminophen) .... Take 1 Tablet By Mouth Every 6 Hours As Needed For Knee Pain  Allergies (verified): No Known Drug Allergies  Past History:  Past Medical History: Last updated: 10/19/2007 Right knee trouble  Social History: Last updated: 06/29/2009 Retired Alcohol use-yes, 4 beers every day Current Smoker: 1/2 PPD Married, but separated  Risk Factors: Exercise: no (08/21/2009)  Risk Factors: Smoking Status: current (08/21/2009) Packs/Day: 1/2 ppd (08/21/2009)  Review of Systems       As per HPI.  Physical Exam  General:  alert, well-developed, well-nourished, and well-hydrated.   Lungs:  normal respiratory effort, no accessory muscle use, and no crackles.  No wheezing today. Heart:  normal rate, regular rhythm, no murmur, no gallop, and no JVD.  Abdomen:  soft, non-tender, normal bowel sounds, no distention, and no masses.   Msk:  Tenderness to palpation and mild swelling on his right knee; no redness,no warmth and no other abnormalities appreciated. Neurologic:  alert & oriented X3, cranial nerves II-XII intact, strength normal in all extremities, sensation intact to light touch, gait normal, and DTRs symmetrical and normal.  Patient is limping on his right side while ambulating.   Impression & Recommendations:  Problem # 1:  DIZZINESS (ICD-780.4) Due to the use of to many alprazolam, percocet and cough syrup. Patient is not having syncope or complaining of orthostasis. Will recommend him to follow medications as directed and to stop overtaing it. Will adjust his pain medications to prevent itching and with that stop his cough syrup overused. Will also check CBC, TSH and Vit D to r/o other causes for his dizziness and weakness; depending results will treat him.  Problem # 2:  DEPRESSION/ANXIETY (ICD-300.4) Depression symptoms continue after been  on celexa 20mg  for more than 4 weeks. will increased to 40mg  dailoy and will follow symptoms in 6  to 8 weeks.                                                                                                                                                                                                                                                                                                               Problem # 3:  ESSENTIAL HYPERTENSION, BENIGN (ICD-401.1) Well controlled; will continue current regimen.Will check renal function and electrolytes.  His updated medication list for this problem includes:    Cardura 4 Mg Tabs (Doxazosin mesylate) .Marland Kitchen... Take 1 tablet by mouth once a day    Hydrochlorothiazide 25 Mg Tabs (Hydrochlorothiazide) .Marland Kitchen... Take 1 tablet by mouth every morning  Orders: T-CBC w/Diff ([id number]) T-CMP with Estimated GFR ([id number]) T-TSH ([id number])  Problem # 4:  OSTEOARTHRITIS, MULTIPLE JOINTS (ICD-715.89) Patient with chronic OA and with hx of right knee traumatic OA (which will need surgical repairment. So far patient unable to use percocet  due to itching; failed to respond to tylenol and dangerous to use to much NSAID due to GI problems and COPD. Will start tx with ms contin and morphine IR for breakthrough; patient will call clinic for side effects or if pain unable to responde to new tx.  The following medications were removed from the medication list:    Percocet 5-325 Mg Tabs (Oxycodone-acetaminophen) .Marland Kitchen... Take 1 tablet by mouth every 6 hours as needed for knee pain His updated medication list for this problem includes:    Ms Contin 15 Mg Xr12h-tab (Morphine sulfate) .Marland Kitchen... Take 1 tablet by mouth two times a day    Morphine Sulfate 15 Mg Tabs (Morphine sulfate) .Marland Kitchen... Take 1 tablet by mouth every 6-8 hrs as needed for pain.  Problem # 5:  COPD (ICD-496) Stable. Will continue current inhaler regimen and will advised patient to stop smoking. (info, tips and  counseling for smoking cessation was provided).  His updated medication list for this problem includes:    Ventolin Hfa 108 (90 Base) Mcg/act Aers (Albuterol sulfate) ..... Use 1-2 puffs every 4 hours as needed for shortness of breath or wheezing    Spiriva Handihaler 18 Mcg Caps (Tiotropium bromide monohydrate) .Marland Kitchen... 1 puff daily    Advair Diskus 250-50 Mcg/dose Misc (Fluticasone-salmeterol) .Marland Kitchen... 1 puff two times a day  Complete Medication List: 1)  Ventolin Hfa 108 (90 Base) Mcg/act Aers (Albuterol sulfate) .... Use 1-2 puffs every 4 hours as needed for shortness of breath or wheezing 2)  Spiriva Handihaler 18 Mcg Caps (Tiotropium bromide monohydrate) .Marland Kitchen.. 1 puff daily 3)  Advair Diskus 250-50 Mcg/dose Misc (Fluticasone-salmeterol) .Marland Kitchen.. 1 puff two times a day 4)  Cardura 4 Mg Tabs (Doxazosin mesylate) .... Take 1 tablet by mouth once a day 5)  Alprazolam 0.5 Mg Tabs (Alprazolam) .... Take 1 tablet by mouth two times a day as needed for anxiety. 6)  Hydrochlorothiazide 25 Mg Tabs (Hydrochlorothiazide) .... Take 1 tablet by mouth every morning 7)  Fluoxetine Hcl 40 Mg Caps (Fluoxetine hcl) .... Take 1 capsule by mouth once a day 8)  Guaiatussin Ac 100-10 Mg/100ml Syrp (Guaifenesin-codeine) .... Take one teaspoonful every 4 hours as needed for cough 9)  Viagra 50 Mg Tabs (Sildenafil citrate) .... Take as directed 10)  Ms Contin 15 Mg Xr12h-tab (Morphine sulfate) .... Take 1 tablet by mouth two times a day 11)  Morphine Sulfate 15 Mg Tabs (Morphine sulfate) .... Take 1 tablet by mouth every 6-8 hrs as needed for pain.  Other Orders: T-Vitamin D (25-Hydroxy) [id number])  Patient Instructions: 1)  Please schedule a follow-up appointment in 2 months. 2)  Take medications as prescribed. 3)  You will be called with any abnormalities in the tests scheduled or performed today.  If you don't hear from [place] within a week from when the test was performed, you can assume that your test was normal. 4)   Tobacco is very bad for your health and your loved ones! You Should stop smoking!. 5)  Stop Smoking Tips: Choose a Quit date. Cut down before the Quit date. decide what you will do as a substitute when you feel the urge to smoke(gum, toothpick, exercise). 6)  Call 1-800 quit now for help and assistance quitting smoking. 7)  Schedule appointment with Dr. Ophelia Charter for further evaluation regarding knee surgery. Prescriptions: MORPHINE SULFATE 15 MG TABS (MORPHINE SULFATE) Take 1 tablet by mouth every 6-8 hrs as needed for pain.  #60 x 0   Entered and Authorized  by:   Vassie Loll MD   Signed by:   Vassie Loll MD on 08/21/2009   Method used:   Print then Give to Patient   RxID:   [id number] MS CONTIN 15 MG XR12H-TAB (MORPHINE SULFATE) Take 1 tablet by mouth two times a day  #62 x 0   Entered and Authorized by:   Vassie Loll MD   Signed by:   Vassie Loll MD on 08/21/2009   Method used:   Print then Give to Patient   RxID:   [id number] FLUOXETINE HCL 40 MG CAPS (FLUOXETINE HCL) Take 1 capsule by mouth once a day  #31 x 3   Entered and Authorized by:   Vassie Loll MD   Signed by:   Vassie Loll MD on 08/21/2009   Method used:   Electronically to        [facility] [address] [id number]* (retail)       [address]       [place], [place]  [address]       Ph: [phone] or [phone]       Fax: [phone]   RxID:   [id number]  Process Orders Check Orders Results:     [facility]: Check successful Tests Sent for requisitioning (August 24, 2009 2:16 PM):     08/21/2009: [facility] -- T-CBC w/Diff [[id number]] (signed)     08/21/2009: [facility] -- T-CMP with Estimated GFR [[id number]] (signed)     08/21/2009: [facility] -- T-TSH [id number] (signed)     08/21/2009: [facility] -- T-Vitamin D (25-Hydroxy) [id number] (signed)     Prevention & Chronic  Care Immunizations   Influenza vaccine: Fluvax 3+  (12/25/2008)   Influenza vaccine deferral: Deferred  (10/10/2008)    Tetanus booster: Not documented    Pneumococcal vaccine: Pneumovax  (02/17/2009)    H. zoster vaccine: Not documented  Colorectal Screening   Hemoccult: Not documented    Colonoscopy: Multiple polys up to 15mm.  Tubular adenomas and hyperplastic polyps.  No dysplasia nor malignancy.  Repeat colonoscopy 5/14  (06/11/2009)   Colonoscopy action/deferral: GI referral  (05/14/2009)   Colonoscopy due: 06/11/2012  Other Screening   PSA: Not documented   PSA action/deferral: Discussed-decision deferred  (03/25/2009)   Smoking status: current  (08/21/2009)   Smoking cessation counseling: yes  (08/21/2009)  Lipids   Total Cholesterol: 147  (10/10/2008)   Lipid panel action/deferral: Lipid Panel ordered   LDL: 75  (10/10/2008)   LDL Direct: Not documented   HDL: 34  (10/10/2008)   Triglycerides: 188  (10/10/2008)  Hypertension   Last Blood Pressure: 129 / 80  (08/21/2009)   Serum creatinine: 0.95  (03/25/2009)   Serum potassium 4.3  (03/25/2009)  Self-Management Support :   Personal Goals (by the next clinic visit) :      Personal blood pressure goal: 140/90  (03/12/2009)   Patient will work on the following items until the next clinic visit to reach self-care goals:     Medications and monitoring: take my medicines every day, bring all of my medications to every visit  (08/21/2009)     Eating: eat more vegetables, use fresh or frozen vegetables, eat baked foods instead of fried foods, eat fruit for snacks and desserts, limit or avoid alcohol  (08/21/2009)     Activity: take a 30 minute walk every day  (05/14/2009)    Hypertension self-management support: Written self-care  plan, Education handout, Resources for patients handout  (03/25/2009)    Self-management comments: PATIENT STATES HE MOWES THE YARD AT TIMES

## 2010-02-23 NOTE — Assessment & Plan Note (Signed)
Summary: medications and knee pain/cfb   Vital Signs:  Patient profile:   65 year old male Height:      69 inches Weight:      249.8 pounds BMI:     37.02 Temp:     97.8 degrees F oral Pulse rate:   73 / minute BP sitting:   148 / 88  (right arm)  Vitals Entered By: Filomena Jungling NT II (February 17, 2009 3:02 PM) CC: bilaterlateral knee apin, back pain blood pressure Is Patient Diabetic? No Pain Assessment Patient in pain? yes     Location: knees, back, Intensity: 7 Type: aching Nutritional Status BMI of > 30 = obese  Does patient need assistance? Functional Status Self care Ambulation Normal   Primary Care Provider:  Jackson Latino MD  CC:  bilaterlateral knee apin and back pain blood pressure.  History of Present Illness: Pt is a 65 y/o with PMH of HTN, OA, COPD, tabacco abuse comes in for regualr f/u and medication refills. He has run out of all his medications about 12 months, now he still has both knee and back pain, has been on ibuprofen 100 mg qid which make it better, but not tylenol, not used Vicodin due to cost. He also has acid reflux and hear burning, has run out of omeprazole for a year. He still has has exertional SOB with wheezing, but no CP or fever. He is a current smoker 2 PPD and 4 beers per day, no drug use.  He has good appetite, no melana or dysuria.   Preventive Screening-Counseling & Management  Alcohol-Tobacco     Alcohol type: BEER / EVERYDAY     Smoking Status: current     Smoking Cessation Counseling: yes     Packs/Day: 2     Year Started: ABOUT THE AGE OF 16  Caffeine-Diet-Exercise     Does Patient Exercise: no  Problems Prior to Update: 1)  Hypertrophy Prostate W/ur Obst & Oth Luts  (ICD-600.01) 2)  Preventive Health Care  (ICD-V70.0) 3)  Chronic Obstructive Pulmonary Disease, Acute Exacerbation  (ICD-491.21) 4)  Gerd  (ICD-530.81) 5)  Dyspnea On Exertion  (ICD-786.09) 6)  Knee Pain, Right  (ICD-719.46) 7)  Adjustment Disorder   (ICD-309.9) 8)  Increased Blood Pressure  (ICD-796.2) 9)  Hearing Loss, 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 Take 1 Tablet Every 6 Hours As Needed For Pain 2)  Ventolin Hfa 108 (90 Base) Mcg/act Aers (Albuterol Sulfate) .... Use 1-2 Puffs Every 4 Hours As Needed For Shortness of Breath or Wheezing 3)  Cvs Omeprazole 20 Mg Tbec (Omeprazole) .... Take 1 Tablet By Mouth Once A Day 4)  Tussionex Pennkinetic Er 8-10 Mg/47ml Lqcr (Chlorpheniramine-Hydrocodone) .... Take 5ml By Mouth Two Times A Day As Needed Cough. 5)  Sterapred Ds 12 Day 10 Mg Tabs (Prednisone) .... As Directed 6)  Spiriva Handihaler 18 Mcg Caps (Tiotropium Bromide Monohydrate) .Marland Kitchen.. 1 Puff Daily 7)  Advair Diskus 250-50 Mcg/dose Misc (Fluticasone-Salmeterol) .... 2 Puff Two Times A Day 8)  Flomax 0.4 Mg Caps (Tamsulosin Hcl) .... Take 1 Tablet By Mouth Once A Day  Current Medications (verified): 1)  Vicodin 5-500 Mg Tabs (Hydrocodone-Acetaminophen) .... Take 1 Tablet Every 6 Hours As Needed For Pain 2)  Ventolin Hfa 108 (90 Base) Mcg/act Aers (Albuterol Sulfate) .... Use 1-2 Puffs Every 4 Hours As Needed For Shortness of Breath or Wheezing 3)  Cvs Omeprazole 20 Mg Tbec (Omeprazole) .... Take 1 Tablet By Mouth Once A Day 4)  Tussionex Pennkinetic Er 8-10 Mg/87ml Lqcr (Chlorpheniramine-Hydrocodone) .... Take 5ml By Mouth Two Times A Day As Needed Cough. 5)  Sterapred Ds 12 Day 10 Mg Tabs (Prednisone) .... As Directed 6)  Spiriva Handihaler 18 Mcg Caps (Tiotropium Bromide Monohydrate) .Marland Kitchen.. 1 Puff Daily 7)  Advair Diskus 250-50 Mcg/dose Misc (Fluticasone-Salmeterol) .... 2 Puff Two Times A Day 8)  Flomax 0.4 Mg Caps (Tamsulosin Hcl) .... Take 1 Tablet By Mouth Once A Day  Allergies (verified): No Known Drug Allergies  Past History:  Past Medical History: Last updated: 10/19/2007 Right knee trouble  Social  History: Last updated: 02/17/2009 Retired Alcohol use-yes, 4 beers every day Current Smoker: 2 PPD Married, but separated  Risk Factors: Smoking Status: current (02/17/2009) Packs/Day: 2 (02/17/2009)  Family History: Reviewed history and no changes required.  Social History: Reviewed history from 10/10/2008 and no changes required. Retired Alcohol use-yes, 4 beers every day Current Smoker: 2 PPD Married, but separated  Review of Systems       The patient complains of dyspnea on exertion and severe indigestion/heartburn.  The patient denies fever, chest pain, peripheral edema, prolonged cough, hemoptysis, abdominal pain, hematuria, and unusual weight change.    Physical Exam  General:  alert, well-developed, well-nourished, well-hydrated, and overweight-appearing.   Head:  normocephalic.   Ears:  no external deformities.   Nose:  no external erythema and no nasal discharge.   Mouth:  pharynx pink and moist.   Neck:  supple.   Chest Wall:  no deformities.   Lungs:  normal respiratory effort, no intercostal retractions, no accessory muscle use, normal breath sounds, no crackles, R wheezes, and L wheezes.   Heart:  normal rate, regular rhythm, no murmur, no rub, and no JVD.   Abdomen:  soft, non-tender, normal bowel sounds, no distention, and no masses.   Msk:  Both knee mild tenderness, no swelling or erythema. Pulses:  2+ Extremities:  No edema.  Neurologic:  alert & oriented X3, cranial nerves II-XII intact, strength normal in all extremities, sensation intact to light touch, and gait normal.     Impression & Recommendations:  Problem # 1:  OSTEOARTHRITIS, MULTIPLE JOINTS (ICD-715.89) Assessment Unchanged He has tried ibuprofen, tylenol, and heating pad, seems all not very help. Will referral to Sports medicine for further management, such as steroid injection. Will refill vicodin before see them.  His updated medication list for this problem includes:    Vicodin 5-500  Mg Tabs (Hydrocodone-acetaminophen) .Marland Kitchen... Take 1 tablet every 6 hours as needed for pain  Discussed use of medications, application of heat or cold, and exercises.   Orders: Sports Medicine (Sports Med)  Problem # 2:  CHRONIC OBSTRUCTIVE PULMONARY DISEASE, ACUTE EXACERBATION (ICD-491.21) Assessment: Unchanged He has run out of his meds for one year and current smoker. Will refill meds and advised to quit smoking. At this moment he does not need oral steroids. We have give him sample of advair and spiriva.  Problem # 3:  GERD (ICD-530.81) Assessment: Unchanged Will refill PPI for his GERD.  His updated medication list for this problem includes:    Cvs Omeprazole 20 Mg Tbec (Omeprazole) .Marland Kitchen... Take 1 tablet by mouth once a day  Problem # 4:  ESSENTIAL HYPERTENSION, BENIGN (ICD-401.1) Assessment: Deteriorated  Has run out of med for one year. Will refill flomax for his HTN and recheck BP at next visit. Orders: T-Basic Metabolic Panel (  [id number])  BP today: 148/88 Prior BP: 125/79 (10/10/2008)  Labs Reviewed: K+: 4.0 (10/10/2008) Creat: : 1.06 (10/10/2008)   Chol: 147 (10/10/2008)   HDL: 34 (10/10/2008)   LDL: 75 (10/10/2008)   TG: 188 (10/10/2008)  Problem # 5:  TOBACCO ABUSE (ICD-305.1) Assessment: Comment Only Current smoker. Encouraged smoking cessation and discussed different methods for smoking cessation.   Complete Medication List: 1)  Vicodin 5-500 Mg Tabs (Hydrocodone-acetaminophen) .... Take 1 tablet every 6 hours as needed for pain 2)  Ventolin Hfa 108 (90 Base) Mcg/act Aers (Albuterol sulfate) .... Use 1-2 puffs every 4 hours as needed for shortness of breath or wheezing 3)  Cvs Omeprazole 20 Mg Tbec (Omeprazole) .... Take 1 tablet by mouth once a day 4)  Tussionex Pennkinetic Er 8-10 Mg/80ml Lqcr (Chlorpheniramine-hydrocodone) .... Take 5ml by mouth two times a day as needed cough. 5)  Spiriva Handihaler 18 Mcg Caps (Tiotropium bromide monohydrate) .Marland Kitchen.. 1 puff  daily 6)  Advair Diskus 250-50 Mcg/dose Misc (Fluticasone-salmeterol) .... 2 puff two times a day 7)  Flomax 0.4 Mg Caps (Tamsulosin hcl) .... Take 1 tablet by mouth once a day  Other Orders: Pneumococcal Vaccine (47425) Admin 1st Vaccine ([id number])  Patient Instructions: 1)  Please schedule a follow-up appointment in 3-4 months. 2)  Will call you if any abnormal lab works.  Prescriptions: FLOMAX 0.4 MG CAPS (TAMSULOSIN HCL) Take 1 tablet by mouth once a day  #30 x 3   Entered and Authorized by:   Jackson Latino MD   Signed by:   Jackson Latino MD on 02/17/2009   Method used:   Print then Give to Patient   RxID:   [id number] ADVAIR DISKUS 250-50 MCG/DOSE MISC (FLUTICASONE-SALMETEROL) 2 puff two times a day  #1 x 4   Entered and Authorized by:   Jackson Latino MD   Signed by:   Jackson Latino MD on 02/17/2009   Method used:   Print then Give to Patient   RxID:   [id number] SPIRIVA HANDIHALER 18 MCG CAPS (TIOTROPIUM BROMIDE MONOHYDRATE) 1 puff daily  #1 x 4   Entered and Authorized by:   Jackson Latino MD   Signed by:   Jackson Latino MD on 02/17/2009   Method used:   Print then Give to Patient   RxID:   [id number] TUSSIONEX PENNKINETIC ER 8-10 MG/5ML LQCR (CHLORPHENIRAMINE-HYDROCODONE) take 5ml by mouth two times a day as needed cough.  #92ml x 1   Entered and Authorized by:   Jackson Latino MD   Signed by:   Jackson Latino MD on 02/17/2009   Method used:   Print then Give to Patient   RxID:   [id number] [facility] OMEPRAZOLE 20 MG TBEC (OMEPRAZOLE) Take 1 tablet by mouth once a day  #30 x 4   Entered and Authorized by:   Jackson Latino MD   Signed by:   Jackson Latino MD on 02/17/2009   Method used:   Print then Give to Patient   RxID:   [id number] VENTOLIN HFA 108 (90 BASE) MCG/ACT AERS (ALBUTEROL SULFATE) use 1-2 puffs every 4 hours as needed for shortness of breath or wheezing  #1 x 2   Entered and Authorized by:   Jackson Latino MD   Signed  by:   Jackson Latino MD on 02/17/2009   Method used:   Print then Give to Patient   RxID:   [id number] VICODIN 5-500 MG TABS (HYDROCODONE-ACETAMINOPHEN) take 1 tablet every 6 hours as needed for pain  #40 x  0   Entered and Authorized by:   Jackson Latino MD   Signed by:   Jackson Latino MD on 02/17/2009   Method used:   Print then Give to Patient   RxID:   [id number]   Prevention & Chronic Care Immunizations   Influenza vaccine: Fluvax 3+  (12/25/2008)   Influenza vaccine deferral: Deferred  (10/10/2008)    Tetanus booster: Not documented    Pneumococcal vaccine: Pneumovax  (02/17/2009)    H. zoster vaccine: Not documented  Colorectal Screening   Hemoccult: Not documented    Colonoscopy: Not documented   Colonoscopy action/deferral: GI referral  (10/10/2008)  Other Screening   PSA: Not documented   Smoking status: current  (02/17/2009)   Smoking cessation counseling: yes  (02/17/2009)  Lipids   Total Cholesterol: 147  (10/10/2008)   Lipid panel action/deferral: Lipid Panel ordered   LDL: 75  (10/10/2008)   LDL Direct: Not documented   HDL: 34  (10/10/2008)   Triglycerides: 188  (10/10/2008)  Hypertension   Last Blood Pressure: 148 / 88  (02/17/2009)   Serum creatinine: 1.06  (10/10/2008)   Serum potassium 4.0  (10/10/2008)  Self-Management Support :    Hypertension self-management support: Not documented   Nursing Instructions: Give Pneumovax today    Process Orders Check Orders Results:     [facility]: ABN not required for this insurance Tests Sent for requisitioning (February 17, 2009 8:47 PM):     02/17/2009: [facility] -- T-Basic Metabolic Panel [phone] (signed)    Process Orders Check Orders Results:     [facility]: ABN not required for this insurance Tests Sent for requisitioning (February 17, 2009 8:47 PM):     02/17/2009: [facility] -- T-Basic Metabolic  Panel [phone] (signed)     Immunizations Administered:  Pneumonia Vaccine:    Vaccine Type: Pneumovax    Site: right deltoid    Mfr: Merck    Dose: 0.5 ml    Route: IM    Given by: Angelina Ok RN    Exp. Date: 05/14/2010    Lot #: [id number]    VIS given: 08/22/95 version given February 17, 2009.

## 2010-02-23 NOTE — Progress Notes (Signed)
Summary: possible reaction/ hla  Phone Note Call from Patient   Summary of Call: pt's spouse calls and reports that for 2 weeks he has had a" reaction" she thinks due to eating chocolate and drinking coffee and taking his medicines, xanax? hctz?, he itches and claws his skin but now has taken to drinking bottles of benadryl with no relief. after looking at the schedule for the next 2 days he is advised to stop drinking benadryl and she is to drive him to [facility] for evaluation of the itching and consumption of large quantity of benadryl. she states she will take him now.  Initial call taken by: Marin Roberts RN,  September 29, 2009 9:59 AM  Follow-up for Phone Call        That sounds appropriate. Thanks Follow-up by: Blanch Media MD,  September 29, 2009 10:53 AM

## 2010-02-23 NOTE — Progress Notes (Signed)
Summary: refill/ hla  Phone Note Refill Request Message from:  Fax from Pharmacy on September 02, 2009 9:18 AM  Refills Requested: Medication #1:  ALPRAZOLAM 0.5 MG TABS Take 1 tablet by mouth two times a day as needed for anxiety.   Dosage confirmed as above?Dosage Confirmed   Last Refilled: 7/8 last visit and labs 08/21/2009  Initial call taken by: Marin Roberts RN,  September 02, 2009 9:19 AM  Additional Follow-up for Phone Call Additional follow up Details #1::        Rx called to pharmacy Additional Follow-up by: Marin Roberts RN,  September 04, 2009 6:24 PM    Prescriptions: ALPRAZOLAM 0.5 MG TABS (ALPRAZOLAM) Take 1 tablet by mouth two times a day as needed for anxiety.  #60 x 0   Entered and Authorized by:   Zoila Shutter MD   Signed by:   Zoila Shutter MD on 09/04/2009   Method used:   Telephoned to ...       [facility] [phone]* (retail)       [address]       [address]       [place], [place]  [address]       Ph: [phone]       Fax: [phone]   RxID:   [id number]

## 2010-02-23 NOTE — Progress Notes (Signed)
Summary: dizziness, appt/ hla  Phone Note Call from Patient   Caller: Spouse Complaint: Breathing Problems Summary of Call: pt's spouse calls w/ c/o dizziness since starting a new bp med, i explained there was none on pt's list but she spelled the med CARDURA, pt is to bring meds with him for appt. appt this am. Initial call taken by: Marin Roberts RN,  March 12, 2009 9:07 AM  Follow-up for Phone Call        Noted. Follow-up by: Zoila Shutter MD,  March 12, 2009 9:31 AM  Additional Follow-up for Phone Call Additional follow up Details #1::        [facility] for bring in the clinic today for evaluation.

## 2010-02-23 NOTE — Procedures (Signed)
Summary: [facility]   Imported By: Margie Billet 07/01/2009 11:38:34  _____________________________________________________________________  External Attachment:    Type:   Image     Comment:   External Document

## 2010-02-23 NOTE — Letter (Signed)
Summary: [facility]   Imported By: Margie Billet 06/09/2009 15:54:29  _____________________________________________________________________  External Attachment:    Type:   Image     Comment:   External Document  Appended Document: [facility]    Clinical Lists Changes  Observations: Added new observation of COLONOSCOPY: Multiple polys up to 15mm.  Tubular adenomas and hyperplastic polyps.  No dysplasia nor malignancy.  Repeat colonoscopy 5/14 (06/11/2009 9:12)

## 2010-02-23 NOTE — Assessment & Plan Note (Signed)
Summary: dizzy, bp med? possible cause/pcp-yang/hla   Vital Signs:  Patient profile:   65 year old male Height:      69 inches (175.26 cm) Weight:      250.8 pounds (114 kg) BMI:     37.17 O2 Sat:      96 % on Room air Temp:     97.4 degrees F oral Pulse rate:   84 / minute Pulse (ortho):   84 / minute BP supine:   164 / 89 BP sitting:   147 / 88  (right arm) BP standing:   147 / 88  Vitals Entered By: Chinita Pester RN (March 12, 2009 10:33 AM)  O2 Flow:  Room air  Serial Vital Signs/Assessments:  Time      Position  BP       Pulse  Resp  Temp     By 10:35 AM  Lying RA  164/89   79                    Chinita Pester RN 10:35 AM  Sitting   147/88   79                    Chinita Pester RN 10:35 AM  Standing  147/88   84                    Glenda Palmer RN  CC: C/o dizziness after he takes Cardura also gets a bad headache.  Also having some SOB./sorethroat/yellow drainage from left ear., Depression Is Patient Diabetic? No Pain Assessment Patient in pain? no      Nutritional Status BMI of > 30 = obese  Have you ever been in a relationship where you felt threatened, hurt or afraid?No   Does patient need assistance? Functional Status Self care Ambulation Impaired:Risk for fall Comments c/o dizziness   Primary Care Provider:  Jackson Latino MD  CC:  C/o dizziness after he takes Cardura also gets a bad headache.  Also having some SOB./sorethroat/yellow drainage from left ear. and Depression.  History of Present Illness: [name] is a 65 yr old gentleman with pmh significant for HTN, COPD, BPH, OA, GERD, and sensorineural hearing loss. The patient presents today for:  1) Dizziness - reports began when he started taking Doxazosin. Reports it is greater when getting out of bed or standing up. He states it is associated with headache.   2) Anxiety - states he constantly feels nervous and anxious. He states he is worried about his wife who is about to have surgery.   3)  Depression - reports he feels depressed ever since he stopped working 2 years ago. He had to stop working due to fall and is currently on disability. He endorsed feelings of sadness, difficulty sleeping and loss of appetite. He states he has been on prozac in the past but had to stop taking it due to mood swings.   4) Left ear ache and pain with discharge. Patient has a hearing aid in that ear and reports occasional drainage from that ear.    Preventive Screening-Counseling & Management  Alcohol-Tobacco     Alcohol type: BEER / EVERYDAY SOMETIMES     Smoking Status: current     Smoking Cessation Counseling: yes     Smoke Cessation Stage: ready     Packs/Day: 1.0     Year Started: ABOUT THE AGE OF 16  Caffeine-Diet-Exercise     Does Patient  Exercise: no  Current Medications (verified): 1)  Vicodin 5-500 Mg Tabs (Hydrocodone-Acetaminophen) .... Take 1 Tablet Every 6 Hours As Needed For Pain 2)  Ventolin Hfa 108 (90 Base) Mcg/act Aers (Albuterol Sulfate) .... Use 1-2 Puffs Every 4 Hours As Needed For Shortness of Breath or Wheezing 3)  Cvs Omeprazole 20 Mg Tbec (Omeprazole) .... Take 1 Tablet By Mouth Once A Day 4)  Spiriva Handihaler 18 Mcg Caps (Tiotropium Bromide Monohydrate) .Marland Kitchen.. 1 Puff Daily 5)  Advair Diskus 250-50 Mcg/dose Misc (Fluticasone-Salmeterol) .... 2 Puff Two Times A Day 6)  Cardura 4 Mg Tabs (Doxazosin Mesylate) .... Take 1 Tablet By Mouth Once A Day  Allergies (verified): No Known Drug Allergies  Past History:  Past Medical History: Last updated: 10/19/2007 Right knee trouble  Social History: Last updated: 02/17/2009 Retired Alcohol use-yes, 4 beers every day Current Smoker: 2 PPD Married, but separated  Risk Factors: Exercise: no (03/12/2009)  Risk Factors: Smoking Status: current (03/12/2009) Packs/Day: 1.0 (03/12/2009)  Social History: Packs/Day:  1.0  Review of Systems General:  Complains of loss of appetite; denies fever and weakness. ENT:   Complains of ear discharge and earache. CV:  Complains of lightheadness; denies chest pain or discomfort, near fainting, palpitations, and shortness of breath with exertion. Resp:  Denies cough and shortness of breath. GI:  Denies abdominal pain, constipation, diarrhea, nausea, and vomiting. GU:  Complains of nocturia and urinary hesitancy. MS:  Complains of joint pain and loss of strength. Psych:  Complains of anxiety, depression, irritability, and panic attacks.  Physical Exam  General:  alert and well-developed.   Head:  normocephalic and atraumatic.   Eyes:  vision grossly intact, pupils equal, pupils round, and pupils reactive to light.   Ears:  R ear normal, L canal inflamed, L canal drainage, L TM erythema, and L decreased hearing.   Nose:  no nasal discharge.   Mouth:  pharynx pink and moist.   Neck:  supple, full ROM, and no masses.   Lungs:  normal respiratory effort, normal breath sounds, and no wheezes.   Heart:  normal rate, regular rhythm, no murmur, no gallop, and no rub.   Abdomen:  soft, non-tender, normal bowel sounds, and no distention.   Msk:  decreased ROM.   Pulses:  R radial normal and L radial normal.   Extremities:  no edema noted Neurologic:  alert & oriented X3 and cranial nerves II-XII intact.   Psych:  slightly anxious.     Impression & Recommendations:  Problem # 1:  DEPRESSION/ANXIETY (ICD-300.4) Assessment New Patient has complained of feelings of depression and anxiety in the past. He was started on Fluoxetine and Prozac in the past, but discontinued their use due to cost and availability and side effects of mood swings. Patient was also thought to have adjustment disorder as his symptoms began shortly after he stopped working. Patient was encouraged to engage in activities such as fishing, exercise, household maintenance and etc. Patient today still complains of feelings of depression and being very anxious.  Plan: -Start low dose Celexa (Citalopram)   -Start low dose Xana as needed for anxiety  Problem # 2:  HYPERTROPHY PROSTATE W/UR OBST & OTH LUTS (ICD-600.01) Assessment: Comment Only Patient was started on Flomax(Tamulosin) for BPH, however patient was unable to obtain this medication as it is not available at [facility]. The patient was given doazosin as replacement, but he developed dizziness and subsequently stopped taking it. The [facility] pharmacy was called and  they do not carry any other medication for BPH besides Doxazosin, and patient does not have insurance and is unable to afford other medications. To decrease the level of dizzines, patient's doxazosin will be decreased to half tab. If he continues to feel dizzy, other options will need to be explored for BPH.   His updated medication list for this problem includes:    Cardura 4 Mg Tabs (Doxazosin mesylate) .Marland Kitchen... Take 1 tablet by mouth once a day  Problem # 3:  OTITIS EXTERNA (ICD-380.10) Assessment: New Left ear canal inflamed, erythematous, likely otitis externa due to hearing aid. Patient was advised to get his hearing aid fitted properly, or obtain a new one. Will also give patient prescription for abx ear drops.   His updated medication list for this problem includes:    Ciprodex 0.3-0.1 % Susp (Ciprofloxacin-dexamethasone) .Marland KitchenMarland KitchenMarland KitchenMarland Kitchen 4 drops to affected ear 4 times daily.  Problem # 4:  DIZZINESS (ICD-780.4) Assessment: New Please refer to assessment of BPH for more details. Dizziness likely 2/2 to Doxazosin.  Plan:  -decrease dose to half tab daily   Complete Medication List: 1)  Vicodin 5-500 Mg Tabs (Hydrocodone-acetaminophen) .... Take 1 tablet every 6 hours as needed for pain 2)  Ventolin Hfa 108 (90 Base) Mcg/act Aers (Albuterol sulfate) .... Use 1-2 puffs every 4 hours as needed for shortness of breath or wheezing 3)  Cvs Omeprazole 20 Mg Tbec (Omeprazole) .... Take 1 tablet by mouth once a day 4)  Spiriva Handihaler 18 Mcg Caps  (Tiotropium bromide monohydrate) .Marland Kitchen.. 1 puff daily 5)  Advair Diskus 250-50 Mcg/dose Misc (Fluticasone-salmeterol) .... 2 puff two times a day 6)  Cardura 4 Mg Tabs (Doxazosin mesylate) .... Take 1 tablet by mouth once a day 7)  Celexa 20 Mg Tabs (Citalopram hydrobromide) .... Take 1 tablet by mouth once a day 8)  Ciprodex 0.3-0.1 % Susp (Ciprofloxacin-dexamethasone) .... 4 drops to affected ear 4 times daily. 9)  Alprazolam 0.5 Mg Tabs (Alprazolam) .... Take 1 tablet by mouth two times a day as needed for anxiety.  Patient Instructions: 1)  Please schedule a follow-up appointment in 1 week for BP recheck. 2)  Please take only half a tablet of your Cardura(Doxazosin), please call clinic if your symptoms of dizziness persist. 3)  Please take Celexa(Citalopram) once daily. 4)  Please xanax 1-2 times daily as needed for anxiety. 5)  Check your Blood Pressure regularly. If it is above 170: you should make an appointment. Prescriptions: ALPRAZOLAM 0.5 MG TABS (ALPRAZOLAM) Take 1 tablet by mouth two times a day as needed for anxiety.  #60 x 0   Entered and Authorized by:   Melida Quitter MD   Signed by:   Melida Quitter MD on 03/12/2009   Method used:   Print then Give to Patient   RxID:   [id number] CIPRODEX 0.3-0.1 % SUSP (CIPROFLOXACIN-DEXAMETHASONE) 4 drops to affected ear 4 times daily.  #1 bottle x 0   Entered and Authorized by:   Melida Quitter MD   Signed by:   Melida Quitter MD on 03/12/2009   Method used:   Print then Give to Patient   RxID:   [id number] CELEXA 20 MG TABS (CITALOPRAM HYDROBROMIDE) Take 1 tablet by mouth once a day  #31 x 0   Entered and Authorized by:   Melida Quitter MD   Signed by:   Melida Quitter MD on 03/12/2009   Method used:   Print then Give to Patient   RxID:   [id number]  Prevention & Chronic Care Immunizations   Influenza vaccine: Fluvax 3+  (12/25/2008)   Influenza vaccine deferral: Deferred  (10/10/2008)    Tetanus booster: Not  documented    Pneumococcal vaccine: Pneumovax  (02/17/2009)    H. zoster vaccine: Not documented  Colorectal Screening   Hemoccult: Not documented    Colonoscopy: Not documented   Colonoscopy action/deferral: Deferred  (03/12/2009)  Other Screening   PSA: Not documented   Smoking status: current  (03/12/2009)   Smoking cessation counseling: yes  (03/12/2009)  Lipids   Total Cholesterol: 147  (10/10/2008)   Lipid panel action/deferral: Lipid Panel ordered   LDL: 75  (10/10/2008)   LDL Direct: Not documented   HDL: 34  (10/10/2008)   Triglycerides: 188  (10/10/2008)  Hypertension   Last Blood Pressure: 147 / 88  (03/12/2009)   Serum creatinine: 1.06  (10/10/2008)   Serum potassium 4.0  (10/10/2008)    Hypertension flowsheet reviewed?: Yes   Progress toward BP goal: Unchanged  Self-Management Support :   Personal Goals (by the next clinic visit) :      Personal blood pressure goal: 140/90  (03/12/2009)   Patient will work on the following items until the next clinic visit to reach self-care goals:     Medications and monitoring: take my medicines every day, check my blood pressure  (03/12/2009)     Eating: eat more vegetables, eat baked foods instead of fried foods, limit or avoid alcohol  (03/12/2009)    Hypertension self-management support: BP self-monitoring log, Written self-care plan  (03/12/2009)   Hypertension self-care plan printed.

## 2010-02-23 NOTE — Assessment & Plan Note (Signed)
Summary: FU ABNORMAL LABS PER DR.TOBBIA/DS   Vital Signs:  Patient profile:   65 year old male Height:      69 inches (175.26 cm) Weight:      250.4 pounds (113.82 kg) BMI:     37.11 O2 Sat:      95 % on Room air Temp:     98.6 degrees F (37.00 degrees C) oral Pulse rate:   79 / minute BP sitting:   138 / 80  (left arm) Cuff size:   large  Vitals Entered By: Cynda Familia Duncan Dull) (December 02, 2009 11:24 AM)  O2 Flow:  Room air CC: f/u abn lab results, , Depression Is Patient Diabetic? No Pain Assessment Patient in pain? no      Nutritional Status BMI of > 30 = obese  Have you ever been in a relationship where you felt threatened, hurt or afraid?No   Does patient need assistance? Functional Status Self care Ambulation Normal   Primary Care Provider:  Jackson Latino MD  CC:  f/u abn lab results, , and Depression.  History of Present Illness: Pt is a 65 y/o with PMH of HTN, anxiety, COPD, tabacco abuse comes in for regular f/u for his lab results. He still has itching and sneeze, no rash or fever.  He has chronic SOB , no CP or palpitation. He has been taking his bronchodilators regularly. His depression stable, no SI/HI. But still anxiety on current xanax.   He has good appetite, no abdominal pain, melana or dysuria. He has gained 3 lbs since last visit. Current smoker 1/2 PPD, ETOH or drug. .   Depression History:      The patient denies a depressed mood most of the day and a diminished interest in his usual daily activities.         Preventive Screening-Counseling & Management  Alcohol-Tobacco     Alcohol type: BEER / EVERYDAY SOMETIMES     Smoking Status: current     Smoking Cessation Counseling: yes     Smoke Cessation Stage: ready     Packs/Day: 1 ppd     Year Started: ABOUT THE AGE OF 16  Problems Prior to Update: 1)  Pruritus  (ICD-698.9) 2)  Disorder of Bone and Cartilage Unspecified  (ICD-733.90) 3)  Dizziness  (ICD-780.4) 4)  Erectile  Dysfunction, Secondary To Medication  (ICD-607.84) 5)  Otitis Externa  (ICD-380.10) 6)  Depression/anxiety  (ICD-300.4) 7)  Essential Hypertension, Benign  (ICD-401.1) 8)  Osteoarthritis, Multiple Joints  (ICD-715.89) 9)  Hypertrophy Prostate W/ur Obst & Oth Luts  (ICD-600.01) 10)  Preventive Health Care  (ICD-V70.0) 11)  COPD  (ICD-496) 12)  Gerd  (ICD-530.81) 13)  Dyspnea On Exertion  (ICD-786.09) 14)  Adjustment Disorder  (ICD-309.9) 15)  Hearing Loss, 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 Use 1-2 Puffs Every 4 Hours As Needed For Shortness of Breath or Wheezing 2)  Spiriva Handihaler 18 Mcg Caps (Tiotropium Bromide Monohydrate) .Marland Kitchen.. 1 Puff Daily 3)  Advair Diskus 250-50 Mcg/dose Misc (Fluticasone-Salmeterol) .Marland Kitchen.. 1 Puff Two Times A Day 4)  Alprazolam 0.5 Mg Tabs (Alprazolam) .... Take 1 Tablet By Mouth Two Times A Day As Needed For Anxiety. 5)  Hydrochlorothiazide 25 Mg Tabs (Hydrochlorothiazide) .... Take 1 Tablet By Mouth Every Morning 6)  Guaiatussin Ac 100-10 Mg/33ml Syrp (Guaifenesin-Codeine) .... Take One Teaspoonful Every 4 Hours As Needed For  Cough 7)  Tramadol Hcl 50 Mg Tabs (Tramadol Hcl) .... Take 1 Tablet By Mouth Three Times A Day For Pain. 8)  Celexa 20 Mg Tabs (Citalopram Hydrobromide) .... Take One Tablet By Mouth Once A Day. 9)  Hydroxyzine Hcl 50 Mg Tabs (Hydroxyzine Hcl) .... Take 1 Tablet By Mouth Three Times A Day As Needed For Itching  Current Medications (verified): 1)  Ventolin Hfa 108 (90 Base) Mcg/act Aers (Albuterol Sulfate) .... Use 1-2 Puffs Every 4 Hours As Needed For Shortness of Breath or Wheezing 2)  Spiriva Handihaler 18 Mcg Caps (Tiotropium Bromide Monohydrate) .Marland Kitchen.. 1 Puff Daily 3)  Advair Diskus 250-50 Mcg/dose Misc (Fluticasone-Salmeterol) .Marland Kitchen.. 1 Puff Two Times A Day 4)  Alprazolam 0.5 Mg Tabs (Alprazolam) .... Take 1 Tablet By  Mouth Two Times A Day As Needed For Anxiety. 5)  Hydrochlorothiazide 25 Mg Tabs (Hydrochlorothiazide) .... Take 1 Tablet By Mouth Every Morning 6)  Guaiatussin Ac 100-10 Mg/71ml Syrp (Guaifenesin-Codeine) .... Take One Teaspoonful Every 4 Hours As Needed For Cough 7)  Tramadol Hcl 50 Mg Tabs (Tramadol Hcl) .... Take 1 Tablet By Mouth Three Times A Day For Pain. 8)  Hydroxyzine Hcl 50 Mg Tabs (Hydroxyzine Hcl) .... Take 1 Tablet By Mouth Three Times A Day As Needed For Itching  Allergies (verified): 1)  ! Celebrex  Past History:  Past Medical History: Last updated: 10/19/2007 Right knee trouble  Social History: Last updated: 06/29/2009 Retired Alcohol use-yes, 4 beers every day Current Smoker: 1/2 PPD Married, but separated  Risk Factors: Smoking Status: current (12/02/2009) Packs/Day: 1 ppd (12/02/2009)  Social History: Reviewed history from 06/29/2009 and no changes required. Retired Alcohol use-yes, 4 beers every day Current Smoker: 1/2 PPD Married, but separated  Review of Systems       The patient complains of weight gain, dyspnea on exertion, and prolonged cough.  The patient denies anorexia, fever, chest pain, syncope, peripheral edema, hemoptysis, abdominal pain, melena, and hematochezia.    Physical Exam  General:  alert, well-developed, well-nourished, well-hydrated, and overweight-appearing.   Nose:  no nasal discharge.   Mouth:  pharynx pink and moist.   Neck:  supple.   Lungs:  normal respiratory effort, normal breath sounds, and no crackles.  Very mild Wheezing. Heart:  normal rate, regular rhythm, no murmur, and no JVD.   Abdomen:  soft, non-tender, normal bowel sounds, and no distention.   Msk:  normal ROM, no joint swelling, no joint warmth, and no redness over joints.  Right knee tenderness. Pulses:  2+ Extremities:  No edema.  Neurologic:  alert & oriented X3, cranial nerves II-XII intact, strength normal in all extremities, sensation intact to light  touch, and DTRs symmetrical and normal.     Impression & Recommendations:  Problem # 1:  PRURITUS (ICD-698.9) Assessment Unchanged The cause is unclear. May be due to medication side effects, allergy or anxiety. Uremia or hepatobiliary disorder is lesslikely. Will give benadryl cream and zyrtec. Also advised him to use some vaseline to avoid dry skin. Will recheck in 2-3 weeks.   Problem # 2:  DEPRESSION/ANXIETY (ICD-300.4) Assessment: Unchanged His depression is stable, but anxiety still not well controlled. Will increase xanax to three times a day for two times a day.   Problem # 3:  TRANSAMINASES, SERUM, ELEVATED (ICD-790.4) Assessment: New I  have discussed in details with him and his wife. The mild increase AKP and AST may be due to his fatty liver, medication side effects, but hepatobiliary disorder needs to  r/u. He has no abdominal pain, weight loss or increased bilirubin, GI malignancy is less likely. Will closely monitor these and may recheck CMET at next visit.   Problem # 4:  ESSENTIAL HYPERTENSION, BENIGN (ICD-401.1) Assessment: Unchanged BP well controlled and will continue current med.  His updated medication list for this problem includes:    Hydrochlorothiazide 25 Mg Tabs (Hydrochlorothiazide) .Marland Kitchen... Take 1 tablet by mouth every morning  BP today: 138/80 Prior BP: 140/81 (11/16/2009)  Labs Reviewed: K+: 3.6 (11/16/2009) Creat: : 1.28 (11/16/2009)   Chol: 147 (10/10/2008)   HDL: 34 (10/10/2008)   LDL: 75 (10/10/2008)   TG: 188 (10/10/2008)  Problem # 5:  COPD (ICD-496) Assessment: Unchanged Stable, no exacerbation episode. Will continue current regimen. Advised to quit smoking.  His updated medication list for this problem includes:    Ventolin Hfa 108 (90 Base) Mcg/act Aers (Albuterol sulfate) ..... Use 1-2 puffs every 4 hours as needed for shortness of breath or wheezing    Spiriva Handihaler 18 Mcg Caps (Tiotropium bromide monohydrate) .Marland Kitchen... 1 puff daily    Advair  Diskus 250-50 Mcg/dose Misc (Fluticasone-salmeterol) .Marland Kitchen... 1 puff two times a day  Pulmonary Functions Reviewed: O2 sat: 95 (12/02/2009)     Vaccines Reviewed: Pneumovax: Pneumovax (02/17/2009)   Flu Vax: Fluvax Non-MCR (10/05/2009)  Complete Medication List: 1)  Ventolin Hfa 108 (90 Base) Mcg/act Aers (Albuterol sulfate) .... Use 1-2 puffs every 4 hours as needed for shortness of breath or wheezing 2)  Spiriva Handihaler 18 Mcg Caps (Tiotropium bromide monohydrate) .Marland Kitchen.. 1 puff daily 3)  Advair Diskus 250-50 Mcg/dose Misc (Fluticasone-salmeterol) .Marland Kitchen.. 1 puff two times a day 4)  Alprazolam 0.5 Mg Tabs (Alprazolam) .... Take 1 tablet by mouth three times a day as needed for anxiety. 5)  Hydrochlorothiazide 25 Mg Tabs (Hydrochlorothiazide) .... Take 1 tablet by mouth every morning 6)  Guaiatussin Ac 100-10 Mg/84ml Syrp (Guaifenesin-codeine) .... Take one teaspoonful every 4 hours as needed for cough 7)  Tramadol Hcl 50 Mg Tabs (Tramadol hcl) .... Take 1 tablet by mouth three times a day for pain. 8)  Hydroxyzine Hcl 50 Mg Tabs (Hydroxyzine hcl) .... Take 1 tablet by mouth three times a day as needed for itching 9)  Benadryl Maximum Strength 2 % Crea (Diphenhydramine hcl) .... Use as instructed 10)  Zyrtec Hives Relief 10 Mg Tabs (Cetirizine hcl) .... Take 1 tablet by mouth once a day 11)  Aspir-low 81 Mg Tbec (Aspirin) .... Take 1 tablet by mouth once a day  Patient Instructions: 1)  Please schedule a follow-up appointment in 2-3 weeks. 2)  Tobacco is very bad for your health and your loved ones! You Should stop smoking!. 3)  Stop Smoking Tips: Choose a Quit date. Cut down before the Quit date. decide what you will do as a substitute when you feel the urge to smoke(gum,toothpick,exercise). 4)  It is important that you exercise regularly at least 20 minutes 5 times a week. If you develop chest pain, have severe difficulty breathing, or feel very tired , stop exercising immediately and seek  medical attention. 5)  You need to lose weight. Consider a lower calorie diet and regular exercise.  Prescriptions: GUAIATUSSIN AC 100-10 MG/5ML SYRP (GUAIFENESIN-CODEINE) Take one teaspoonful every 4 hours as needed for cough  #1 x 2   Entered and Authorized by:   Jackson Latino MD   Signed by:   Jackson Latino MD on 12/02/2009   Method used:   Print then Give to Patient  RxID:   [id number] ALPRAZOLAM 0.5 MG TABS (ALPRAZOLAM) Take 1 tablet by mouth three times a day as needed for anxiety.  #90 x 1   Entered and Authorized by:   Jackson Latino MD   Signed by:   Jackson Latino MD on 12/02/2009   Method used:   Print then Give to Patient   RxID:   [id number] ASPIR-LOW 81 MG TBEC (ASPIRIN) Take 1 tablet by mouth once a day  #30 x 5   Entered and Authorized by:   Jackson Latino MD   Signed by:   Jackson Latino MD on 12/02/2009   Method used:   Electronically to        [facility] [address] [phone]* (retail)       [address]       [address]       [place], [place]  [address]       Ph: [phone]       Fax: [phone]   RxID:   [id number] ZYRTEC HIVES RELIEF 10 MG TABS (CETIRIZINE HCL) Take 1 tablet by mouth once a day  #30 x 3   Entered and Authorized by:   Jackson Latino MD   Signed by:   Jackson Latino MD on 12/02/2009   Method used:   Electronically to        [facility] [address] [phone]* (retail)       [address]       [address]       [place], [place]  [address]       Ph: [phone]       Fax: [phone]   RxID:   [id number] BENADRYL MAXIMUM STRENGTH 2 % CREA (DIPHENHYDRAMINE HCL) use as instructed  #1 x 3   Entered and Authorized by:   Jackson Latino MD   Signed by:   Jackson Latino MD on 12/02/2009   Method used:   Electronically to        [facility] [address] [phone]* (retail)       [address]       [address]       [place], [place]  [address]       Ph: [phone]       Fax: [phone]   RxID:    [id number] HYDROCHLOROTHIAZIDE 25 MG TABS (HYDROCHLOROTHIAZIDE) Take 1 tablet by mouth every morning  #30 Each x 10   Entered and Authorized by:   Jackson Latino MD   Signed by:   Jackson Latino MD on 12/02/2009   Method used:   Electronically to        [facility] [address] [phone]* (retail)       [address]       [address]       [place], [place]  [address]       Ph: [phone]       Fax: [phone]   RxID:   [id number] ADVAIR DISKUS 250-50 MCG/DOSE MISC (FLUTICASONE-SALMETEROL) 1 puff two times a day  #1 x 6   Entered and Authorized by:   Jackson Latino MD   Signed by:   Jackson Latino MD on 12/02/2009   Method used:   Electronically to        [facility] [address] [phone]* (retail)       [address]       [address]       [place], [place]  [address]  Ph: [phone]       Fax: [phone]   RxID:   [id number] SPIRIVA HANDIHALER 18 MCG CAPS (TIOTROPIUM BROMIDE MONOHYDRATE) 1 puff daily  #1 x 6   Entered and Authorized by:   Jackson Latino MD   Signed by:   Jackson Latino MD on 12/02/2009   Method used:   Electronically to        [facility] [address] [phone]* (retail)       [address]       [address]       [place], [place]  [address]       Ph: [phone]       Fax: [phone]   RxID:   [id number] VENTOLIN HFA 108 (90 BASE) MCG/ACT AERS (ALBUTEROL SULFATE) use 1-2 puffs every 4 hours as needed for shortness of breath or wheezing  #1 x 6   Entered and Authorized by:   Jackson Latino MD   Signed by:   Jackson Latino MD on 12/02/2009   Method used:   Electronically to        [facility] [address] [phone]* (retail)       [address]       [address]       [place], [place]  [address]       Ph: [phone]       Fax: [phone]   RxID:   [id number] HYDROCHLOROTHIAZIDE 25 MG TABS (HYDROCHLOROTHIAZIDE) Take 1 tablet by mouth every morning  #30 Each x 10   Entered and Authorized by:   Jackson Latino MD   Signed  by:   Jackson Latino MD on 12/02/2009   Method used:   Electronically to        [facility] [address] [phone]* (retail)       [address]       [place], [place]  [address]       Ph: [phone] or [phone]       Fax: [phone]   RxID:   [id number] ADVAIR DISKUS 250-50 MCG/DOSE MISC (FLUTICASONE-SALMETEROL) 1 puff two times a day  #1 x 6   Entered and Authorized by:   Jackson Latino MD   Signed by:   Jackson Latino MD on 12/02/2009   Method used:   Electronically to        [facility] [address] [phone]* (retail)       [address]       [place], [place]  [address]       Ph: [phone] or [phone]       Fax: [phone]   RxID:   [id number] SPIRIVA HANDIHALER 18 MCG CAPS (TIOTROPIUM BROMIDE MONOHYDRATE) 1 puff daily  #1 x 6   Entered and Authorized by:   Jackson Latino MD   Signed by:   Jackson Latino MD on 12/02/2009   Method used:   Electronically to        [facility] [address] [phone]* (retail)       [address]       [place], [place]  [address]       Ph: [phone] or [phone]       Fax: [phone]   RxID:   [id number] VENTOLIN HFA 108 (90 BASE) MCG/ACT AERS (ALBUTEROL SULFATE) use 1-2 puffs every 4 hours as needed for shortness of breath or  wheezing  #1 x 6   Entered and Authorized by:   Jackson Latino MD   Signed by:   Jackson Latino MD on 12/02/2009   Method used:   Electronically to        [facility] [address] [phone]* (retail)       [address]       [place], [place]  [address]       Ph: [phone] or [phone]       Fax: [phone]   RxID:   [id number] ASPIR-LOW 81 MG TBEC (ASPIRIN) Take 1 tablet by mouth once a day  #30 x 5   Entered and Authorized by:   Jackson Latino MD   Signed by:   Jackson Latino MD on 12/02/2009   Method used:   Electronically to        [facility] [address] [phone]* (retail)       [address]       [place], [place]  [address]       Ph: [phone] or [phone]       Fax: [phone]   RxID:   [id number] ZYRTEC HIVES RELIEF 10 MG TABS (CETIRIZINE HCL) Take 1 tablet by mouth once a day  #30 x 3   Entered and Authorized by:   Jackson Latino MD   Signed by:   Jackson Latino MD on 12/02/2009   Method used:   Electronically to        [facility] [address] [phone]* (retail)       [address]       [place], [place]  [address]       Ph: [phone] or [phone]       Fax: [phone]   RxID:   [id number] BENADRYL MAXIMUM STRENGTH 2 % CREA (DIPHENHYDRAMINE HCL) use as instructed  #1 x 3   Entered and Authorized by:   Jackson Latino MD   Signed by:   Jackson Latino MD on 12/02/2009   Method used:   Electronically to        [facility] [address] [phone]* (retail)       [address]       [place], [place]  [address]       Ph: [phone] or [phone]       Fax: [phone]   RxID:   [id number]    Orders Added: 1)  Est. Patient Level IV [[address]]    Prevention & Chronic Care Immunizations   Influenza vaccine: Fluvax Non-MCR  (10/05/2009)   Influenza vaccine deferral: Deferred  (10/10/2008)    Tetanus booster: Not documented    Pneumococcal vaccine: Pneumovax  (02/17/2009)    H. zoster vaccine: Not documented  Colorectal Screening   Hemoccult: Not documented   Hemoccult action/deferral: Not indicated  (12/02/2009)    Colonoscopy: Multiple polys up to 15mm.  Tubular adenomas and hyperplastic polyps.  No dysplasia nor malignancy.  Repeat colonoscopy 5/14  (06/11/2009)   Colonoscopy action/deferral: GI referral  (05/14/2009)   Colonoscopy due: 06/11/2012  Other Screening   PSA: Not documented   PSA action/deferral: Discussed-decision deferred  (03/25/2009)   Smoking status: current  (12/02/2009)   Smoking cessation counseling: yes  (12/02/2009)  Lipids   Total  Cholesterol: 147  (10/10/2008)   Lipid panel action/deferral: Lipid Panel  ordered   LDL: 75  (10/10/2008)   LDL Direct: Not documented   HDL: 34  (10/10/2008)   Triglycerides: 188  (10/10/2008)  Hypertension   Last Blood Pressure: 138 / 80  (12/02/2009)   Serum creatinine: 1.28  (11/16/2009)   Serum potassium 3.6  (11/16/2009)    Hypertension flowsheet reviewed?: Yes   Progress toward BP goal: Improved  Self-Management Support :   Personal Goals (by the next clinic visit) :      Personal blood pressure goal: 140/90  (03/12/2009)   Patient will work on the following items until the next clinic visit to reach self-care goals:     Medications and monitoring: take my medicines every day, weigh myself weekly  (12/02/2009)     Eating: eat more vegetables  (12/02/2009)     Activity: take a 30 minute walk every day  (12/02/2009)    Hypertension self-management support: Written self-care plan, Education handout  (12/02/2009)   Hypertension self-care plan printed.   Hypertension education handout printed

## 2010-02-23 NOTE — Progress Notes (Signed)
Summary: phone/gg  Phone Note Call from Patient   Caller: Patient Summary of Call: I have talked to pt again and he does not want to take ANY morphine because it makes him very sleepy.  He wants to try vicodin again as it has worked well in the past.  Will you restart  vicodin ? Initial call taken by: Merrie Roof RN,  August 31, 2009 3:08 PM  Follow-up for Phone Call        if that what his want; is fine with me. Just make sure he has an appoinment in the next copule weeks; he needs to bring his morphine tot hat appoinment.    New/Updated Medications: VICODIN 5-500 MG TABS (HYDROCODONE-ACETAMINOPHEN) Take 1 tab by mouth every 6hrs as needed for pain. Prescriptions: VICODIN 5-500 MG TABS (HYDROCODONE-ACETAMINOPHEN) Take 1 tab by mouth every 6hrs as needed for pain.  #120 x 0   Entered and Authorized by:   Vassie Loll MD   Signed by:   Vassie Loll MD on 08/31/2009   Method used:   Telephoned to ...       [facility] [address] [phone]* (retail)       [address]       [place], [place]  [address]       Ph: [phone] or [phone]       Fax: [phone]   RxID:   [id number]   Appended Document: phone/gg Rx called in and pt aware

## 2010-02-23 NOTE — Progress Notes (Signed)
Summary: B/P medication  Phone Note From Pharmacy   Caller: [facility] Call For: Dr. Threasa Beards  Summary of Call: Call from [place] at pharmacy.  Pt is there at the pharmacy says that he is supposed to be getting something for his blood pressure.  Pharmacy also does not carry the Flomax. Angelina Ok RN  February 19, 2009 2:24 PM Call from pt's daughter said that pt's B/p at 12:50 PM was 180/100 and that she took it again at the pharmacy and it is 169/102.  Said that pt needs something for his B/P. Said that pt isnot having and blurred vision.  Pt says he is not having any nausea just feels bad. Initial call taken by: Angelina Ok RN,  February 19, 2009 2:24 PM    New/Updated Medications: DOXAZOSIN MESYLATE 8 MG TABS (DOXAZOSIN MESYLATE) Take 1/2 tablet by mouth at bedtime for blood pressure Prescriptions: DOXAZOSIN MESYLATE 8 MG TABS (DOXAZOSIN MESYLATE) Take 1/2 tablet by mouth at bedtime for blood pressure  #15 x 5   Entered and Authorized by:   Doneen Poisson MD   Signed by:   Doneen Poisson MD on 02/19/2009   Method used:   Faxed to ...       [facility] (retail)       [address] [place], [place]  [address]       Ph: [phone]       Fax: [phone]   RxID:   [id number]   Appended Document: B/P medication Apparently the [facility] Pharmacy does not carry terazosin.  They only carry doxazosin 8 mg by mouth at bedtime.  He prescription was therefore changed to 4 mg by mouth at bedtime.

## 2010-02-23 NOTE — Assessment & Plan Note (Signed)
Summary: Soc. Work  60 minutes. Social Work.  65 yo AA male with multiple health issues and wife Okey Dupre who can be reached at [phone]. Counseling and referral to Mental Health for Depression and anxiety.  Referred by Dr. Meredith Pel and Dr. Loistine Chance to set up psychiatry.   Patient was on Prozac and now has been off of it since Thursday.  He has some suicidal ideation but no plan or hx of MH issues.   Patient not working and on disability.  He was a Designer, fashion/clothing and now finds himself feeling "useless" and "worthless." His wife is supportive but apparently he lives with his brother not his wife and draws about $600 in SSD.  He has had a series of losses including and stepsister and some friends who have recently died. He is not handling those losses well and tells me he hates funerals.    Most of the visit was spent calling practitioners who would accept his [employer] Cross [facility] plan.   We were declined by [facility] and [facility] who wanted $140 upfront which was not feasible.  I knew the others on the list would not work with the patient financially.   A/P Appmts secured for October 6th 10 AM for the patient to visit therapist at [facility] and then earliest Psychiatric appmt with Dr. Zara Council on Nov 30th at 10 AM.    Note that wife and patient are not on board with medication because of his experience with the Prozac that they believe has caused him to sleep for most of the day and so I suggested he visit with a therapist and then get a psychiatrist eval to see if there are any other appropriate meds.  This patient is very depressed and needs a combination of both counseling and therapy to achieve best outcome which I explained to both the patient and wife.    He is also a member of [employer] and suggested that he begin involving himself in some activities at his church that may help relieve his feelings of isolation.   SW F/U.

## 2010-02-23 NOTE — Progress Notes (Signed)
Summary: refill/gg  Phone Note Refill Request  on May 04, 2009 1:51 PM  Refills Requested: Medication #1:  ALPRAZOLAM 0.5 MG TABS Take 1 tablet by mouth two times a day as needed for anxiety.   Dosage confirmed as above?Dosage Confirmed   Last Refilled: 04/16/2009  Method Requested: Fax to Local Pharmacy Initial call taken by: Merrie Roof RN,  May 04, 2009 1:51 PM  Follow-up for Phone Call        Rx completed in Dr. Tiajuana Amass Follow-up by: Jackson Latino MD,  May 04, 2009 2:45 PM    Prescriptions: ALPRAZOLAM 0.5 MG TABS (ALPRAZOLAM) Take 1 tablet by mouth two times a day as needed for anxiety.  #60 x 0   Entered and Authorized by:   Jackson Latino MD   Signed by:   Jackson Latino MD on 05/04/2009   Method used:   Telephoned to ...       [facility] Department (retail)       [address] [place], [place]  [address]       Ph: [phone]       Fax: [phone]   RxID:   [id number]

## 2010-02-23 NOTE — Progress Notes (Signed)
Summary: phone/gg  Phone Note Call from Patient   Caller: Spouse Summary of Call: Call from pt's wife stating pt was given morphine and he is allergic to morphine, causing itching.  She would like a Rx for vicodine like he used to take. Pt # [id number] Initial call taken by: Merrie Roof RN,  August 28, 2009 3:17 PM

## 2010-02-23 NOTE — Progress Notes (Signed)
Summary: refill/gg  Phone Note Refill Request  on April 15, 2009 11:21 AM  Refills Requested: Medication #1:  ALPRAZOLAM 0.5 MG TABS Take 1 tablet by mouth two times a day as needed for anxiety.   Dosage confirmed as above?Dosage Confirmed   Last Refilled: 03/13/2009  Method Requested: Fax to Local Pharmacy Initial call taken by: Merrie Roof RN,  April 15, 2009 11:21 AM    Prescriptions: ALPRAZOLAM 0.5 MG TABS (ALPRAZOLAM) Take 1 tablet by mouth two times a day as needed for anxiety.  #60 x 0   Entered and Authorized by:   Jackson Latino MD   Signed by:   Jackson Latino MD on 04/16/2009   Method used:   Telephoned to ...       [facility] Department (retail)       [address] [place], [place]  [address]       Ph: [phone]       Fax: [phone]   RxID:   [id number]   Appended Document: refill/gg Rx faxed in

## 2010-02-23 NOTE — Progress Notes (Signed)
Summary: clarification/gp  Phone Note From Pharmacy   Caller: [facility] pharmacy. Summary of Call: The pharmacist requested clarification on qty.for Guaiatussin.  I talked to Dr. Loistine Chance; qty of  4oz. order given.  [facility] pharmacy called and made awared. Initial call taken by: Chinita Pester RN,  October 07, 2009 11:39 AM

## 2010-02-23 NOTE — Progress Notes (Signed)
Summary: refill/ hla  Phone Note Refill Request Message from:  [name] on July 10, 2009 10:48 AM  Refills Requested: Medication #1:  PERCOCET 5-325 MG TABS Take 1 tablet by mouth every 6 hours as needed for knee pain.   Dosage confirmed as above?Dosage Confirmed   Last Refilled: 4/21 Initial call taken by: Marin Roberts RN,  July 10, 2009 10:49 AM  Follow-up for Phone Call        Rx written Follow-up by: Jackson Latino MD,  July 10, 2009 11:41 AM    Prescriptions: PERCOCET 5-325 MG TABS (OXYCODONE-ACETAMINOPHEN) Take 1 tablet by mouth every 6 hours as needed for knee pain  #30 x 0   Entered and Authorized by:   Jackson Latino MD   Signed by:   Jackson Latino MD on 07/10/2009   Method used:   Handwritten   RxID:   [id number]

## 2010-02-23 NOTE — Assessment & Plan Note (Signed)
Summary: checkup/pcp-Jaimie Pippins/hla   Vital Signs:  Patient profile:   65 year old male Height:      69 inches Weight:      236.4 pounds BMI:     35.04 O2 Sat:      94 % on Room air Temp:     97.0 degrees F oral Pulse rate:   80 / minute BP sitting:   120 / 77  (right arm)  Vitals Entered By: Filomena Jungling NT II (June 29, 2009 10:20 AM)  O2 Flow:  Room air CC: BREATHING PROBLEM, Depression Is Patient Diabetic? No Pain Assessment Patient in pain? no      Nutritional Status BMI of > 30 = obese  Have you ever been in a relationship where you felt threatened, hurt or afraid?No   Does patient need assistance? Functional Status Self care Ambulation Normal   Primary Care Provider:  Jackson Latino MD  CC:  BREATHING PROBLEM and Depression.  History of Present Illness: Pt is a 65 y/o with PMH of HTN, depression, COPD, tabacco abuse comes in for regular f/u. He has SOB and about 80% of his best time, no cough or fever. He has not taken his bronchodilators regularly for a while, not known the exact time. He is a current smoker  1/2 PPD, no drug use.  He has good appetite, no melana or dysuria. He has been taking all his other meds as instructed.  Depression History:      The patient denies a depressed mood most of the day and a diminished interest in his usual daily activities.         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 Use 1-2 Puffs Every 4 Hours As Needed For Shortness of Breath or Wheezing 2)  Spiriva Handihaler 18 Mcg Caps (Tiotropium Bromide Monohydrate) .Marland Kitchen.. 1 Puff Daily 3)  Advair Diskus 250-50 Mcg/dose Misc (Fluticasone-Salmeterol) .Marland Kitchen.. 1 Puff Two Times A Day 4)  Cardura 4 Mg Tabs (Doxazosin Mesylate) .... Take 1 Tablet By Mouth Once A Day 5)  Ciprodex 0.3-0.1 % Susp (Ciprofloxacin-Dexamethasone) .... 4 Drops To Affected Ear 4 Times Daily. 6)  Alprazolam 0.5 Mg Tabs (Alprazolam) .... Take 1 Tablet By Mouth Two Times A Day As Needed For Anxiety. 7)  Hydrochlorothiazide 25 Mg Tabs (Hydrochlorothiazide) .... Take 1 Tablet By Mouth Every Morning 8)  Fluoxetine Hcl 20 Mg Caps (Fluoxetine Hcl) .... Take 1 Tablet By Mouth Once A Day 9)  Guaiatussin Ac 100-10  Mg/69ml Syrp (Guaifenesin-Codeine) .... Take One Teaspoonful Every 4 Hours As Needed For Cough 10)  Viagra 50 Mg Tabs (Sildenafil Citrate) .... Take As Directed 11)  Percocet 5-325 Mg Tabs (Oxycodone-Acetaminophen) .... Take 1 Tablet By Mouth Every 6 Hours As Needed For Knee Pain  Current Medications (verified): 1)  Ventolin Hfa 108 (90 Base) Mcg/act Aers (Albuterol Sulfate) .... Use 1-2 Puffs Every 4 Hours As Needed For Shortness of Breath or Wheezing 2)  Spiriva Handihaler 18 Mcg Caps (Tiotropium Bromide Monohydrate) .Marland Kitchen.. 1 Puff Daily 3)  Advair Diskus 250-50 Mcg/dose Misc (Fluticasone-Salmeterol) .Marland Kitchen.. 1 Puff Two Times A Day 4)  Cardura 4 Mg Tabs (Doxazosin Mesylate) .... Take 1 Tablet By Mouth Once A Day 5)  Alprazolam 0.5 Mg Tabs (Alprazolam)  .... Take 1 Tablet By Mouth Two Times A Day As Needed For Anxiety. 6)  Hydrochlorothiazide 25 Mg Tabs (Hydrochlorothiazide) .... Take 1 Tablet By Mouth Every Morning 7)  Fluoxetine Hcl 20 Mg Caps (Fluoxetine Hcl) .... Take 1 Tablet By Mouth Once A Day 8)  Guaiatussin Ac 100-10 Mg/27ml Syrp (Guaifenesin-Codeine) .... Take One Teaspoonful Every 4 Hours As Needed For Cough 9)  Viagra 50 Mg Tabs (Sildenafil Citrate) .... Take As Directed 10)  Percocet 5-325 Mg Tabs (Oxycodone-Acetaminophen) .... Take 1 Tablet By Mouth Every 6 Hours As Needed For Knee Pain  Allergies (verified): No Known Drug Allergies  Past History:  Past Medical History: Last updated: 10/19/2007 Right knee trouble  Social History: Last updated: 06/29/2009 Retired Alcohol use-yes, 4 beers every day Current Smoker: 1/2 PPD Married, but separated  Risk Factors: Exercise: no (06/29/2009)  Risk Factors: Smoking Status: current (06/29/2009) Packs/Day: 1/2 ppd (06/29/2009)  Social History: Retired Alcohol use-yes, 4 beers every day Current Smoker: 1/2 PPD Married, but separated Packs/Day:  1/2 ppd  Review of Systems       The patient complains of dyspnea on exertion and depression.  The patient denies fever, decreased hearing, hoarseness, chest pain, syncope, prolonged cough, headaches, hemoptysis, abdominal pain, melena, and abnormal bleeding.    Physical Exam  General:  alert, well-developed, well-nourished, and well-hydrated.   Head:  normocephalic.   Nose:  no nasal discharge.   Mouth:  pharynx pink and moist.   Neck:  supple.   Lungs:  normal respiratory effort, no accessory muscle use, and no crackles.  very mild wheezing. Heart:  normal rate, regular rhythm, no murmur, no gallop, and no JVD.   Abdomen:  soft, non-tender, normal bowel sounds, no distention, and no masses.   Msk:  normal ROM, no joint tenderness, no joint swelling, no joint warmth, and no redness over joints.   Pulses:  2+ Extremities:   No edema. Neurologic:  alert & oriented X3, cranial nerves II-XII intact, strength normal in all extremities, sensation intact to light touch, gait normal, and DTRs symmetrical and normal.     Impression & Recommendations:  Problem # 1:  ESSENTIAL HYPERTENSION, BENIGN (ICD-401.1) Assessment Improved His BP at goal and he has been taking his meds and will continue these meds. No dizziness or CP.  Recheck BO at next visit.  His updated medication list for this problem includes:    Cardura 4 Mg Tabs (Doxazosin mesylate) .Marland Kitchen... Take 1 tablet by mouth once a day    Hydrochlorothiazide 25 Mg Tabs (Hydrochlorothiazide) .Marland Kitchen... Take 1 tablet by mouth every morning  BP today: 120/77 Prior BP: 136/83 (05/14/2009)  Labs Reviewed: K+: 4.3 (03/25/2009) Creat: : 0.95 (03/25/2009)   Chol: 147 (10/10/2008)  HDL: 34 (10/10/2008)   LDL: 75 (10/10/2008)   TG: 188 (10/10/2008)  Problem # 2:  COPD (ICD-496) Assessment: Unchanged He is current smoker and has not taken his bronchodilators as instructed. He has very mild wheezing, O2Sat is good on RA, instructed how to use these meds and he understood that these should be used regularly except albuterol prn. Advised him to quit smoking.  His updated medication list for this problem includes:    Ventolin Hfa 108 (90 Base) Mcg/act Aers (Albuterol sulfate) ..... Use 1-2 puffs every 4 hours as needed for shortness of breath or wheezing    Spiriva Handihaler 18 Mcg Caps (Tiotropium bromide monohydrate) .Marland Kitchen... 1 puff daily    Advair Diskus 250-50 Mcg/dose Misc (Fluticasone-salmeterol) .Marland Kitchen... 1 puff two times a day  Pulmonary Functions Reviewed: O2 sat: 94 (06/29/2009)     Vaccines Reviewed: Pneumovax: Pneumovax (02/17/2009)   Flu Vax: Fluvax 3+ (12/25/2008)  Problem # 3:  TOBACCO ABUSE (ICD-305.1) Assessment: Comment Only Encouraged smoking cessation and discussed different methods for smoking cessation. He will consider to cut his smoking first.   Problem # 4:   DEPRESSION/ANXIETY (ICD-300.4) Assessment: Unchanged His depression is stable, no SI/HI. Will continue prozac and xanax.   Problem # 5:  GERD (ICD-530.81) Assessment: Unchanged He has been taking omeprazole for it and stable.  Labs Reviewed: Hgb: 15.5 (03/27/2008)   Hct: 44.2 (03/27/2008)  Complete Medication List: 1)  Ventolin Hfa 108 (90 Base) Mcg/act Aers (Albuterol sulfate) .... Use 1-2 puffs every 4 hours as needed for shortness of breath or wheezing 2)  Spiriva Handihaler 18 Mcg Caps (Tiotropium bromide monohydrate) .Marland Kitchen.. 1 puff daily 3)  Advair Diskus 250-50 Mcg/dose Misc (Fluticasone-salmeterol) .Marland Kitchen.. 1 puff two times a day 4)  Cardura 4 Mg Tabs (Doxazosin mesylate) .... Take 1 tablet by mouth once a day 5)  Alprazolam 0.5 Mg Tabs (Alprazolam) .... Take 1 tablet by mouth two times a day as needed for anxiety. 6)  Hydrochlorothiazide 25 Mg Tabs (Hydrochlorothiazide) .... Take 1 tablet by mouth every morning 7)  Fluoxetine Hcl 20 Mg Caps (Fluoxetine hcl) .... Take 1 tablet by mouth once a day 8)  Guaiatussin Ac 100-10 Mg/54ml Syrp (Guaifenesin-codeine) .... Take one teaspoonful every 4 hours as needed for cough 9)  Viagra 50 Mg Tabs (Sildenafil citrate) .... Take as directed 10)  Percocet 5-325 Mg Tabs (Oxycodone-acetaminophen) .... Take 1 tablet by mouth every 6 hours as needed for knee pain  Patient Instructions: 1)  Please schedule a follow-up appointment in 6 months. 2)  Please take your medications as instructed. 3)  Tobacco is very bad for your health and your loved ones! You Should stop smoking!. 4)  Stop Smoking Tips: Choose a Quit date. Cut down before the Quit date. decide what you will do as a substitute when you feel the urge to smoke(gum,toothpick,exercise). 5)  You need to lose weight. Consider a lower calorie diet and regular exercise.     Prevention & Chronic Care Immunizations   Influenza vaccine: Fluvax 3+  (12/25/2008)   Influenza vaccine deferral: Deferred   (10/10/2008)    Tetanus booster: Not documented    Pneumococcal vaccine: Pneumovax  (02/17/2009)    H. zoster vaccine: Not documented  Colorectal Screening   Hemoccult: Not documented    Colonoscopy: Multiple polys up to 15mm.  Tubular adenomas and hyperplastic polyps.  No dysplasia nor malignancy.  Repeat colonoscopy 5/14  (06/11/2009)   Colonoscopy action/deferral: GI referral  (05/14/2009)   Colonoscopy due: 06/11/2012  Other Screening   PSA: Not documented   PSA action/deferral: Discussed-decision deferred  (03/25/2009)   Smoking status: current  (06/29/2009)   Smoking cessation counseling: yes  (06/29/2009)  Lipids   Total Cholesterol: 147  (10/10/2008)   Lipid panel action/deferral: Lipid Panel ordered   LDL: 75  (10/10/2008)   LDL Direct: Not documented   HDL: 34  (10/10/2008)   Triglycerides: 188  (10/10/2008)  Hypertension   Last Blood Pressure: 120 / 77  (06/29/2009)   Serum creatinine: 0.95  (03/25/2009)   Serum potassium 4.3  (03/25/2009)    Hypertension flowsheet reviewed?: Yes   Progress toward BP goal: At goal  Self-Management Support :   Personal Goals (by the next clinic visit) :      Personal blood pressure goal: 140/90  (03/12/2009)   Patient will work on the following items until the next clinic visit to reach self-care goals:     Medications and monitoring: take my medicines every day, bring all of my medications to every visit  (06/29/2009)     Eating: drink diet soda or water instead of juice or soda, eat more vegetables, use fresh or frozen vegetables  (06/29/2009)     Activity: take a 30 minute walk every day  (05/14/2009)    Hypertension self-management support: Written self-care plan, Education handout, Resources for patients handout  (03/25/2009)

## 2010-02-23 NOTE — Progress Notes (Signed)
Summary: itching/ hla  Phone Note Call from Patient   Caller: Spouse Summary of Call: pt's wife calls c/o pt stating he wants to harm self due to itching and just not feeling well. she is instructed to take pt to [facility] or call 911, she states she will do so. Initial call taken by: Marin Roberts RN,  October 02, 2009 4:31 PM

## 2010-02-24 ENCOUNTER — Telehealth: Payer: Self-pay | Admitting: *Deleted

## 2010-02-24 DIAGNOSIS — K7200 Acute and subacute hepatic failure without coma: Secondary | ICD-10-CM

## 2010-02-24 LAB — BASIC METABOLIC PANEL
BUN: 17 mg/dL (ref 6–23)
Calcium: 8.9 mg/dL (ref 8.4–10.5)
Creatinine, Ser: 1.06 mg/dL (ref 0.4–1.5)
GFR calc non Af Amer: >60 mL/min (ref >60)
Glucose, Bld: 108 mg/dL — ABNORMAL HIGH (ref 70–99)

## 2010-02-24 LAB — URINE MICROSCOPIC-ADD ON

## 2010-02-24 LAB — URINALYSIS, ROUTINE W REFLEX MICROSCOPIC
Protein, ur: 100 mg/dL — AB
Urobilinogen, UA: 4 mg/dL — ABNORMAL HIGH (ref 0.0–1.0)

## 2010-02-24 LAB — CBC
HCT: 39.8 % (ref 39.0–52.0)
MCH: 30.4 pg (ref 26.0–34.0)
MCHC: 33.9 g/dL (ref 30.0–36.0)
MCV: 89.6 fL (ref 78.0–100.0)
RDW: 14.2 % (ref 11.5–15.5)

## 2010-02-24 LAB — RAPID URINE DRUG SCREEN, HOSP PERFORMED
Cocaine: NOT DETECTED
Opiates: NOT DETECTED
Tetrahydrocannabinol: NOT DETECTED

## 2010-02-24 LAB — HEPATITIS C ANTIBODY: HCV Ab: REACTIVE — AB

## 2010-02-24 NOTE — Telephone Encounter (Signed)
Pt asked to call [phone] for information.

## 2010-02-25 LAB — COMPREHENSIVE METABOLIC PANEL
AST: 64 U/L — ABNORMAL HIGH (ref 0–37)
Alkaline Phosphatase: 202 U/L — ABNORMAL HIGH (ref 39–117)
BUN: 20 mg/dL (ref 6–23)
CO2: 20 mEq/L (ref 19–32)
Chloride: 104 mEq/L (ref 96–112)
Creatinine, Ser: 1.03 mg/dL (ref 0.4–1.5)
GFR calc Af Amer: >60 mL/min (ref >60)
GFR calc non Af Amer: >60 mL/min (ref >60)
Potassium: 4.4 mEq/L (ref 3.5–5.1)
Total Bilirubin: 2.7 mg/dL — ABNORMAL HIGH (ref 0.3–1.2)

## 2010-02-25 LAB — URINE CULTURE
Colony Count: >=100000
Culture  Setup Time: 201202010054

## 2010-02-25 NOTE — Assessment & Plan Note (Signed)
Summary: early one day/gg   Vital Signs:  Patient profile:   65 year old male Height:      69 inches Weight:      249.7 pounds BMI:     37.01 Temp:     97.9 degrees F oral Pulse rate:   75 / minute BP sitting:   128 / 79  (right arm)  Vitals Entered By: Filomena Jungling NT II (February 01, 2010 9:37 AM) CC: follow-up visit Is Patient Diabetic? No Pain Assessment Patient in pain? no      Nutritional Status BMI of > 30 = obese  Have you ever been in a relationship where you felt threatened, hurt or afraid?No   Does patient need assistance? Functional Status Self care Ambulation Normal   Primary Care Provider:  Jackson Latino MD  CC:  follow-up visit.  History of Present Illness: 65 y/o man with PMH significant for comes for COPD, elevated transaminases , depression comes for a follow up on itching for past 5 months.  He reports itching all over his body. He and his wife reports no improvement in his symptoms for past 5 months to an extennt that now he is really frustated and agitated from his symptoms. Denies any abdominal pain/N/V/fever/chills.  He was seen by dematologist back in December for his itching and was diagnosed with eczema and also with onychymosis of his foot and was given some  ketoconazole craeam.    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 Use  1-2 Puffs Every 4 Hours As Needed For Shortness of Breath or Wheezing 2)  Spiriva Handihaler 18 Mcg Caps (Tiotropium Bromide Monohydrate) .Marland Kitchen.. 1 Puff Daily 3)  Advair Diskus 250-50 Mcg/dose Misc (Fluticasone-Salmeterol) .Marland Kitchen.. 1 Puff Two Times A Day 4)  Alprazolam 0.5 Mg Tabs (Alprazolam) .... Take 1 Tablet By Mouth Three Times A Day As Needed For Anxiety. 5)  Guaiatussin Ac 100-10 Mg/16ml Syrp (Guaifenesin-Codeine) .... Take One Teaspoonful Every 4 Hours As Needed For Cough 6)  Tramadol Hcl 50 Mg Tabs (Tramadol Hcl) .... Take 1 Tablet By Mouth Three Times A Day For Pain. 7)  Hydroxyzine Hcl 50 Mg Tabs (Hydroxyzine Hcl) .... Take 1 Tablet By Mouth Three Times A Day As Needed For Itching 8)  Benadryl Maximum Strength 2 % Crea (Diphenhydramine Hcl) .... Use As Instructed 9)  Zyrtec Hives Relief 10 Mg Tabs (Cetirizine Hcl) .... Take 1 Tablet By Mouth Once A Day 10)  Aspir-Low 81 Mg Tbec (Aspirin) .... Take 1 Tablet By Mouth Once A Day 11)  Diphenhydramine Hcl 50 Mg Tabs (Diphenhydramine Hcl) .... Take 1 Tablet By Mouth Three Times A Day As Needed For Itching  Current Medications (verified): 1)  Ventolin Hfa 108 (90 Base) Mcg/act Aers (Albuterol Sulfate) .... Use 1-2 Puffs Every 4 Hours As Needed For Shortness of Breath or Wheezing 2)  Spiriva Handihaler 18 Mcg Caps (Tiotropium Bromide Monohydrate) .Marland KitchenMarland KitchenMarland Kitchen 1  Puff Daily 3)  Advair Diskus 250-50 Mcg/dose Misc (Fluticasone-Salmeterol) .Marland Kitchen.. 1 Puff Two Times A Day 4)  Alprazolam 0.5 Mg Tabs (Alprazolam) .... Take 1 Tablet By Mouth Three Times A Day As Needed For Anxiety. 5)  Guaiatussin Ac 100-10 Mg/74ml Syrp (Guaifenesin-Codeine) .... Take One Teaspoonful Every 4 Hours As Needed For Cough 6)  Tramadol Hcl 50 Mg Tabs (Tramadol Hcl) .... Take 1 Tablet By Mouth Three Times A Day For Pain. 7)  Hydroxyzine Hcl 50 Mg Tabs (Hydroxyzine Hcl) .... Take 1 Tablet By Mouth Three Times A Day As Needed For Itching 8)  Benadryl Maximum Strength 2 % Crea (Diphenhydramine Hcl)  .... Use As Instructed 9)  Zyrtec Hives Relief 10 Mg Tabs (Cetirizine Hcl) .... Take 1 Tablet By Mouth Once A Day 10)  Aspir-Low 81 Mg Tbec (Aspirin) .... Take 1 Tablet By Mouth Once A Day 11)  Diphenhydramine Hcl 50 Mg Tabs (Diphenhydramine Hcl) .... Take 1 Tablet By Mouth Three Times A Day As Needed For Itching 12)  Ketoconazole 2 % Crea (Ketoconazole) .... Apply Cream To The Affected Area As Directed. 13)  Desonide 0.05 % Crea (Desonide) 14)  Diltiazem Hcl 60 Mg Tabs (Diltiazem Hcl) .... Take 1 Tab Buy Mouth Twice Daily  Allergies (verified): 1)  ! Celebrex 2)  ! Sulfa  Past History:  Past Medical History: Last updated: 10/19/2007 Right knee trouble  Social History: Last updated: 06/29/2009 Retired Alcohol use-yes, 4 beers every day Current Smoker: 1/2 PPD Married, but separated  Risk Factors: Exercise: no (11/16/2009)  Risk Factors: Smoking Status: current (12/23/2009) Packs/Day: 1 ppd (12/23/2009)  Review of Systems  The patient denies anorexia, fever, weight loss, decreased hearing, hoarseness, chest pain, syncope, dyspnea on exertion, peripheral edema, prolonged cough, headaches, hemoptysis, abdominal pain, melena, and hematochezia.    Physical Exam  General:  alert, well-developed, well-nourished, and well-hydrated.   Head:  normocephalic, atraumatic, no abnormalities observed, and no abnormalities palpated.   Eyes:  vision grossly intact, pupils equal, pupils round, and pupils reactive to light.   Mouth:  pharynx pink and moist.   Neck:  supple, full ROM, and no masses.   Lungs:  normal respiratory effort, no intercostal retractions, no accessory muscle use, normal breath sounds, no dullness, no fremitus, no crackles, and no wheezes.   Heart:  normal rate, regular rhythm, no murmur, no gallop, no rub, and no JVD.   Abdomen:  soft, non-tender, normal bowel sounds, no distention, no masses, no guarding, no rigidity, and no rebound tenderness.   Msk:  normal ROM,  no joint tenderness, no joint swelling, no joint warmth, no redness over joints, and no joint deformities.   Pulses:  2+pulses b/l. Extremities:  no cyanosis, clubbing or edema. Neurologic:  alert & oriented X3, cranial nerves II-XII intact, strength normal in all extremities, sensation intact to light touch, sensation intact to pinprick, gait normal, and DTRs symmetrical and normal.   Skin:  no rashes, no erythema, scratch marks visible all over his body,. greenish appearing first two toe nails of his left foot.   Impression & Recommendations:  Problem # 1:  PRURITUS (ICD-698.9) Assessment Comment Only He reports no relief from itching in the past 5 months. Her wife thinks that he might be having symptoms after he was started on HCTZ for his HTN.  In our records it appears that he was stopped HCTZ by Dr. Threasa Beards back in [place] but the patient did n't understand and he continued that until today. Of note he  has hard of  hearing and his wife was not accompanying him in the last visit. Etiology unclear so far but DD include hepatobiliary disease in the setting of elevated liver enzymes vs eczema vs scabies. -Was seen by dermatolgist back in December, will try retieving those records. -His hepatic function anel was pending from last time, will get that done today. -He was re- emphasized that his HCTZ was d/c today and he was started on diltiazem for his HTN. Being AA , did not start him on ACE-I as they are not so much effective in AA without diuretics.Given his COPD, will not start him on b- blockers. _ he was taking hydroxyzine once daily.He was instructed that he can take that 3-4 times/day as needed for his itching.  Problem # 2:  TRANSAMINASES, SERUM, ELEVATED (ICD-790.4) Assessment: Comment Only This is most likely from his alcohol abuse in the past. He reports that he quit a year ago. Will get hepatic function panel today.  Problem # 3:  ESSENTIAL HYPERTENSION, BENIGN  (ICD-401.1) Assessment: Comment Only  Well controlled. His HCTZ was d/c on last visit but he was still taking that, as he is hard of hearing and did not understand the instructions. He took his last dose today morning. Will start him on diltiazem (did not start on b-blockers as he has COPD and not on ACE being AA) His updated medication list for this problem includes:    Diltiazem Hcl 60 Mg Tabs (Diltiazem hcl) .Marland Kitchen... Take 1 tab buy mouth twice daily  BP today: 128/79 Prior BP: 133/80 (12/23/2009)  Labs Reviewed: K+: 3.6 (11/16/2009) Creat: : 1.28 (11/16/2009)   Chol: 147 (10/10/2008)   HDL: 34 (10/10/2008)   LDL: 75 (10/10/2008)   TG: 188 (10/10/2008)  Problem # 4:  COPD (ICD-496) Assessment: Comment Only Stable. He was councelled to quit smoking. Will continue the same regimen for now. His updated medication list for this problem includes:    Ventolin Hfa 108 (90 Base) Mcg/act Aers (Albuterol sulfate) ..... Use 1-2 puffs every 4 hours as needed for shortness of breath or wheezing    Spiriva Handihaler 18 Mcg Caps (Tiotropium bromide monohydrate) .Marland Kitchen... 1 puff daily    Advair Diskus 250-50 Mcg/dose Misc (Fluticasone-salmeterol) .Marland Kitchen... 1 puff two times a day  Problem # 5:  ONYCHOMYCOSIS, TOENAILS (ICD-110.1) Assessment: Comment Only He was seen by dermatologist back in Decemeber and was diagnosed with onychoycosis of toe nails.On exam his toe nails are greenish, deformed in appearance. He got with him, the creams prescribed by dermatologist. Will continue him on ketoconazole cream for now. His prescription was refilled. His updated medication list for this problem includes:    Ketoconazole 2 % Crea (Ketoconazole) .Marland Kitchen... Apply cream to the affected area as directed.  Complete Medication List: 1)  Ventolin Hfa 108 (90 Base) Mcg/act Aers (Albuterol sulfate) .... Use 1-2 puffs every 4 hours as needed for shortness of breath or wheezing 2)  Spiriva Handihaler 18 Mcg Caps (Tiotropium bromide  monohydrate) .Marland Kitchen.. 1 puff daily 3)  Advair Diskus 250-50 Mcg/dose Misc (Fluticasone-salmeterol) .Marland Kitchen.. 1 puff two times a day 4)  Alprazolam 0.5 Mg Tabs (Alprazolam) .... Take 1 tablet by mouth three times a day as needed for anxiety. 5)  Guaiatussin Ac 100-10 Mg/43ml Syrp (Guaifenesin-codeine) .... Take one teaspoonful every 4 hours as needed for cough 6)  Tramadol Hcl 50 Mg Tabs (Tramadol hcl) .... Take 1 tablet by mouth three times a day for pain. 7)  Hydroxyzine Hcl 50 Mg Tabs (  Hydroxyzine hcl) .... Take 1 tablet by mouth three times a day as needed for itching 8)  Benadryl Maximum Strength 2 % Crea (Diphenhydramine hcl) .... Use as instructed 9)  Zyrtec Hives Relief 10 Mg Tabs (Cetirizine hcl) .... Take 1 tablet by mouth once a day 10)  Aspir-low 81 Mg Tbec (Aspirin) .... Take 1 tablet by mouth once a day 11)  Diphenhydramine Hcl 50 Mg Tabs (Diphenhydramine hcl) .... Take 1 tablet by mouth three times a day as needed for itching 12)  Ketoconazole 2 % Crea (Ketoconazole) .... Apply cream to the affected area as directed. 13)  Desonide 0.05 % Crea (Desonide) 14)  Diltiazem Hcl 60 Mg Tabs (Diltiazem hcl) .... Take 1 tab buy mouth twice daily  Patient Instructions: 1)   Please schedule a follow-up appointment in 2 months. 2)  Please take your medicines as prescribed. Prescriptions: DILTIAZEM HCL 60 MG TABS (DILTIAZEM HCL) take 1 tab buy mouth twice daily  #60 x 1   Entered and Authorized by:   Elyse Jarvis   Signed by:   Elyse Jarvis on 02/01/2010   Method used:   Print then Give to Patient   RxID:   [id number] HYDROXYZINE HCL 50 MG TABS (HYDROXYZINE HCL) Take 1 tablet by mouth three times a day as needed for itching  #90 x 0   Entered and Authorized by:   Elyse Jarvis   Signed by:   Elyse Jarvis on 02/01/2010   Method used:   Print then Give to Patient   RxID:   [id number] METOPROLOL TARTRATE 25 MG TABS (METOPROLOL TARTRATE) take 1 tab by mouth two times a day  #60 x 1    Entered and Authorized by:   Elyse Jarvis   Signed by:   Elyse Jarvis on 02/01/2010   Method used:   Print then Give to Patient   RxID:   [id number] DESONIDE 0.05 % CREA (DESONIDE)   #1 cream x 2   Entered and Authorized by:   Elyse Jarvis   Signed by:   Elyse Jarvis on 02/01/2010   Method used:   Print then Give to Patient   RxID:   [id number] KETOCONAZOLE 2 % CREA (KETOCONAZOLE) Apply cream to the affected area as directed.  #1 cream x 4   Entered and Authorized by:   Elyse Jarvis   Signed by:   Elyse Jarvis on 02/01/2010   Method used:   Print then Give to Patient   RxID:   [id number]    Orders Added: 1)  Est. Patient Level IV [67619]

## 2010-02-25 NOTE — Progress Notes (Signed)
Summary: CALL/ [facility]  Phone Note Call from Patient   Summary of Call: pt's wife called to ask about lab work, spoke to dr Meredith Pel, he would like for pt to be seen within 2 wks, i explained to spouse that dr Threasa Beards had spoken to pt and spouse in nov and did she remember talking to him about elevated liver labs, she said " something but we didn't understand some of it" i explained that when the pt is seen that the md can explain again as he/she reviews the levels and answer their questions. i told her someone would call her today with an appt, i spoke w/ chilon and she said more than likely mon 1/23.  Initial call taken by: Marin Roberts RN,  February 19, 2010 12:26 PM

## 2010-02-25 NOTE — Consult Note (Signed)
Summary: [facility]   Imported By: Margie Billet 02/09/2010 11:22:56  _____________________________________________________________________  External Attachment:    Type:   Image     Comment:   External Document

## 2010-02-26 LAB — HCV RNA QUANT RFLX ULTRA OR GENOTYP: HCV Quantitative Log: 5.65 {Log} — ABNORMAL HIGH (ref <1.63)

## 2010-03-03 NOTE — Progress Notes (Signed)
Summary: Refill/gh  Phone Note Refill Request Message from:  Fax from Pharmacy on February 19, 2010 12:27 PM  Refills Requested: Medication #1:  Omeprazole 20 mg capsules .  Take 1 capsule by mouth every day # 90 Last office visit was 02/18/2010   Method Requested: Electronic Initial call taken by: Angelina Ok RN,  February 19, 2010 1:44 PM  Follow-up for Phone Call        Rx completed in Dr. Tiajuana Amass Follow-up by: Jackson Latino MD,  February 24, 2010 7:12 AM

## 2010-03-03 NOTE — Assessment & Plan Note (Signed)
Summary: ACUTE-TO GO OVER LAB WORK PER DR JOINES(YANG)/CFB   Vital Signs:  Patient profile:   65 year old male Height:      69 inches (175.26 cm) Weight:      247.1 pounds (112.32 kg) BMI:     36.62 O2 Sat:      96 % on Room air Temp:     97.2 degrees F (36.22 degrees C) oral Pulse rate:   74 / minute BP sitting:   139 / 77  (left arm)  Vitals Entered By: Stanton Kidney Ditzler RN (February 18, 2010 4:20 PM)  O2 Flow:  Room air Is Patient Diabetic? No Pain Assessment Patient in pain? no      Nutritional Status BMI of > 30 = obese Nutritional Status Detail appetite good  Have you ever been in a relationship where you felt threatened, hurt or afraid?denies   Does patient need assistance? Functional Status Self care Ambulation Normal Comments Wife with pt. Has white productive cough and hoarseness since last visit. Itching all over. Discuss labs.   Primary Care Provider:  Jackson Latino MD   History of Present Illness: [name] comes in today for follow on abnormal LFTs done earlier this month. He continues to complain of: puritus and dark urine.  He also reports a 7d hx of increased cough productive of whitish phlem with increased nasal drainage.  Denies fever, chills, sore throat, sinus headache, c/p, sob, syncope.  Reports icnreased fatigue.  No other concerns or complaints.  Depression History:      The patient denies a depressed mood most of the day and a diminished interest in his usual daily activities.         Preventive Screening-Counseling & Management  Alcohol-Tobacco     Alcohol type: BEER / EVERYDAY SOMETIMES     Smoking Status: current     Smoking Cessation Counseling: yes     Smoke Cessation Stage: ready     Packs/Day: 1 ppd     Year Started: ABOUT THE AGE OF 16  Caffeine-Diet-Exercise     Does Patient Exercise: no  Current Medications (verified): 1)  Ventolin Hfa 108 (90 Base) Mcg/act Aers (Albuterol Sulfate) .... Use 1-2 Puffs Every 4 Hours As Needed  For Shortness of Breath or Wheezing 2)  Spiriva Handihaler 18 Mcg Caps (Tiotropium Bromide Monohydrate) .Marland Kitchen.. 1 Puff Daily 3)  Advair Diskus 250-50 Mcg/dose Misc (Fluticasone-Salmeterol) .Marland Kitchen.. 1 Puff Two Times A Day 4)  Alprazolam 0.5 Mg Tabs (Alprazolam) .... Take 1 Tablet By Mouth Three Times A Day As Needed For Anxiety. 5)  Guaiatussin Ac 100-10 Mg/12ml Syrp (Guaifenesin-Codeine) .... Take One Teaspoonful Every 4 Hours As Needed For Cough 6)  Tramadol Hcl 50 Mg Tabs (Tramadol Hcl) .... Take 1 Tablet By Mouth Three Times A Day For Pain. 7)  Hydroxyzine Hcl 50 Mg Tabs (Hydroxyzine Hcl) .... Take 1 Tablet By Mouth Three Times A Day As Needed For Itching 8)  Benadryl Maximum Strength 2 % Crea (Diphenhydramine Hcl) .... Use As Instructed 9)  Zyrtec Hives Relief 10 Mg Tabs (Cetirizine Hcl) .... Take 1 Tablet By Mouth Once A Day 10)  Aspir-Low 81 Mg Tbec (Aspirin) .... Take 1 Tablet By Mouth Once A Day 11)  Diphenhydramine Hcl 50 Mg Tabs (Diphenhydramine Hcl) .... Take 1 Tablet By Mouth Three Times A Day As Needed For Itching 12)  Ketoconazole 2 % Crea (Ketoconazole) .... Apply Cream To The Affected Area As Directed. 13)  Desonide 0.05 % Crea (Desonide) 14)  Diltiazem Hcl 60 Mg Tabs (Diltiazem Hcl) .... Take 1 Tab Buy Mouth Twice Daily  Allergies: 1)  ! Celebrex 2)  ! Sulfa  Past History:  Past medical, surgical, family and social histories (including risk factors) reviewed for relevance to current acute and chronic problems.  Past Medical History: Reviewed history from 10/19/2007 and no changes required. Right knee trouble  Family History: Reviewed history and no changes required.  Social History: Reviewed history from 06/29/2009 and no changes required. Retired Alcohol use-yes, 4 beers every day Current Smoker: 1/2 PPD Married, but separated  Physical Exam  General:  alert, well-developed, well-nourished, and well-hydrated.   Head:  normocephalic, atraumatic, no abnormalities  observed, and no abnormalities palpated.   Eyes:  vision grossly intact, pupils equal, pupils round, and pupils reactive to light.  mild scleral icterus noted.   Mouth:  pharynx pink and moist.   Neck:  supple and no masses.   Lungs:  normal respiratory effort, no intercostal retractions, no accessory muscle use, slightly coarse breath sounds, no crackles, and no wheezes.   Heart:  normal rate, regular rhythm, no murmur, no gallop, no rub, and no JVD.  normal rate, regular rhythm, no murmur, and no gallop.   Abdomen:  soft, non-tender, normal bowel sounds, no distention, no masses, and no guarding.   Msk:  normal ROM, no joint tenderness, no joint swelling, no joint warmth, and no redness over joints.   Extremities:  no cyanosis, clubbing or edema. Neurologic:  alert & oriented X3, cranial nerves II-XII intact, strength normal in all extremities, and sensation intact to light touch.   Skin:  turgor normal, color normal, and no rashes.   Skin is dry, multiple mild excoriations present on all four extremities and trunk Psych:  Cognition and judgment appear intact. Alert and cooperative with normal attention span and concentration. No apparent delusions, illusions, hallucinations   Impression & Recommendations:  Problem # 1:  TRANSAMINASES, SERUM, ELEVATED (ICD-790.4) Patient is here today for follow-up on abnormal LFTs. Will initiate a complete work-up for hepatitis at this point- the elevated AP and high bili do not suggest ETOH as sole reason for his labs and continued symptoms- elevation has cholestatic pattern. He actually denies drinking any ETOH in the last year. Will get [place] of RUQ, CBD and PAN. Additionally will get comprehensive work up for hepatitis. Will get a GGT to see if ETOH contributing factor.  Orders: T-CBC w/Diff [id number]) T-CMP with Estimated GFR ([id number]) T-HIV Antibody  (Reflex) [id number]) T-Hepatitis A Antibody ([id number]) T-Hepatitis B Core Antibody  ([id number]) T-Hepatitis B Surface Antibody ([id number]) T-Hepatitis B Surface Antigen (40347-42595) T-Hepatitis C Antibody ([id number]) T-Gamma GT (GGT) ([id number])  Problem # 2:  PRURITUS (ICD-698.9) Likely 2/2 #1 (increased bili).  WIll add mirtazapine for addtional relief (pt already on atarax and taking otc benadryl).  Pt has already been evaluated by derm and rx'd topical tx (anti-fungal, steroids) without relief.   Will check TSH and HIV to eval for other possibilities.   Orders: T-TSH ([id number]) T-CMP with Estimated GFR ([id number]) T-HIV Antibody  (Reflex) [id number]) T-Hepatitis A Antibody ([id number]) T-Hepatitis B Core Antibody ([id number]) T-Hepatitis B Surface Antibody ([id number]) T-Hepatitis B Surface Antigen ([id number]) T-Hepatitis C Antibody (93716-96789) T-Gamma GT (GGT) ([id number])  Problem # 3:  GERD (ICD-530.81)  Will  rx omeprazole for relief of heartburn.  Labs Reviewed: Hgb: 16.0 (08/21/2009)   Hct: 46.5 (08/21/2009)  His updated medication list for this problem includes:    Omeprazole 40 Mg Cpdr (Omeprazole) .Marland Kitchen... Take  1 tablet by mouth once a day  Complete Medication List: 1)  Ventolin Hfa 108 (90 Base) Mcg/act Aers (Albuterol sulfate) .... Use 1-2 puffs every 4 hours as needed for shortness of breath or wheezing 2)  Spiriva Handihaler 18 Mcg Caps (Tiotropium bromide monohydrate) .Marland Kitchen.. 1 puff daily 3)  Advair Diskus 250-50 Mcg/dose Misc (Fluticasone-salmeterol) .Marland Kitchen.. 1 puff two times a day 4)  Alprazolam 0.5 Mg Tabs (Alprazolam) .... Take 1 tablet by mouth three times a day as needed for anxiety. 5)  Guaiatussin Ac 100-10 Mg/55ml Syrp (Guaifenesin-codeine) .... Take one teaspoonful every 4 hours as needed for cough 6)  Tramadol Hcl 50 Mg Tabs (Tramadol hcl) .... Take 1 tablet by mouth three times a day for pain. 7)  Hydroxyzine Hcl 50 Mg Tabs (Hydroxyzine hcl) .... Take 1 tablet by mouth three times a day as needed for itching 8)   Benadryl Maximum Strength 2 % Crea (Diphenhydramine hcl) .... Use as instructed 9)  Zyrtec Hives Relief 10 Mg Tabs (Cetirizine hcl) .... Take 1 tablet by mouth once a day 10)  Aspir-low 81 Mg Tbec (Aspirin) .... Take 1 tablet by mouth once a day 11)  Diphenhydramine Hcl 50 Mg Tabs (Diphenhydramine hcl) .... Take 1 tablet by mouth three times a day as needed for itching 12)  Ketoconazole 2 % Crea (Ketoconazole) .... Apply cream to the affected area as directed. 13)  Desonide 0.05 % Crea (Desonide) 14)  Diltiazem Hcl 60 Mg Tabs (Diltiazem hcl) .... Take 1 tab buy mouth twice daily 15)  Omeprazole 40 Mg Cpdr (Omeprazole) .... Take 1 tablet by mouth once a day 16)  Mirtazapine 15 Mg Tabs (Mirtazapine) .... Take 1 tab by mouth at bedtime  Other Orders: Ultrasound (Ultrasound) Prescriptions: MIRTAZAPINE 15 MG TABS (MIRTAZAPINE) Take 1 tab by mouth at bedtime  #30 x 1   Entered and Authorized by:   Neil Bucks DO   Signed by:   Neil Bucks DO on 02/25/2010   Method used:   Handwritten   RxID:   [id number] OMEPRAZOLE 40 MG CPDR (OMEPRAZOLE) Take 1 tablet by mouth once a day  #30 x 3   Entered and Authorized by:   Neil Bucks DO   Signed by:   Neil Bucks DO on 02/25/2010   Method used:   Handwritten   RxID:   [id number]    Orders Added: 1)  Ultrasound [Ultrasound] 2)  T-CBC w/Diff [[id number]] 3)  T-TSH [[id number]] 4)  T-CMP with Estimated GFR [[address]] 5)  T-HIV Antibody  (Reflex) [[id number]] 6)  T-Hepatitis A Antibody [[id number]] 7)  T-Hepatitis B Core Antibody [[id number]] 8)  T-Hepatitis B Surface Antibody [[id number]] 9)  T-Hepatitis B Surface Antigen [55732-20254] 10)  T-Hepatitis C Antibody [86803-23620] 11)  T-Gamma GT (GGT) [[id number]]   Process Orders Check Orders Results:     [facility]: ABN not required for this insurance Tests Sent for requisitioning (February 25, 2010 10:24 PM):     02/18/2010: [facility] -- T-CBC w/Diff [[id number]] (signed)     02/18/2010: [facility] -- T-TSH [id number] (signed)     02/18/2010: [facility] -- T-CMP with Estimated GFR [[id number]] (signed)     02/18/2010: [facility] -- T-HIV Antibody  (Reflex) [[id number]] (signed)     02/18/2010: [facility] -- T-Hepatitis A Antibody [[id number]] (signed)     02/18/2010: [facility] -- T-Hepatitis B Core Antibody [[id number]] (signed)     02/18/2010: [facility] -- T-Hepatitis B  Surface Antibody [[id number]] (signed)     02/18/2010: [facility] -- T-Hepatitis B Surface Antigen [[id number]] (signed)     02/18/2010: [facility] -- T-Hepatitis C Antibody [[id number]] (signed)     02/18/2010: [facility] -- T-Gamma GT (GGT) [[id number]] (signed)     Prevention & Chronic Care Immunizations   Influenza vaccine: Fluvax Non-MCR  (10/05/2009)   Influenza vaccine deferral: Deferred  (10/10/2008)    Tetanus booster: Not documented    Pneumococcal vaccine: Pneumovax  (02/17/2009)    H. zoster vaccine: Not documented  Colorectal Screening   Hemoccult: Not documented   Hemoccult action/deferral: Not indicated  (12/02/2009)    Colonoscopy: Multiple polys up to 15mm.  Tubular adenomas and hyperplastic polyps.  No dysplasia nor malignancy.  Repeat colonoscopy 5/14  (06/11/2009)   Colonoscopy action/deferral: GI referral  (05/14/2009)   Colonoscopy due: 06/11/2012  Other Screening   PSA: Not documented   PSA action/deferral: Discussed-decision deferred  (03/25/2009)   Smoking status: current  (02/18/2010)   Smoking cessation counseling: yes  (02/18/2010)  Lipids   Total Cholesterol: 147  (10/10/2008)   Lipid panel action/deferral: Lipid Panel ordered   LDL: 75  (10/10/2008)   LDL Direct: Not documented   HDL: 34  (10/10/2008)   Triglycerides: 188   (10/10/2008)  Hypertension   Last Blood Pressure: 139 / 77  (02/18/2010)   Serum creatinine: 1.28  (11/16/2009)   Serum potassium 3.6  (11/16/2009)  Self-Management Support :   Personal Goals (by the next clinic visit) :      Personal blood pressure goal: 140/90  (03/12/2009)   Patient will work on the following items until the next clinic visit to reach self-care goals:     Medications and monitoring: take my medicines every day, bring all of my medications to every visit  (02/18/2010)     Eating: eat more vegetables, use fresh or frozen vegetables, eat fruit for snacks and desserts, limit or avoid alcohol  (02/18/2010)     Activity: take a 30 minute walk every day, park at the far end of the parking lot  (02/18/2010)    Hypertension self-management support: Written self-care plan, Education handout, Resources for patients handout  (02/18/2010)   Hypertension self-care plan printed.   Hypertension education handout printed      Resource handout printed.  Appended Document: ACUTE-TO GO OVER LAB WORK PER DR JOINES(YANG)/CFB >59min spent reviewing previous offive visits and lab data with at least 50% face to face time spent couseling pt about his chronic medical conditions.   Clinical Lists Changes  Orders: Added new Service order of Est. Patient Level IV (73710) - Signed

## 2010-03-03 NOTE — Initial Assessments (Signed)
INTERNAL MEDICINE ADMISSION HISTORY AND PHYSICAL  PCP: Dr. Threasa Beards  1st contact [name] [phone] 2nd contact Dr Gilford Rile [phone] Holidays or 5pm on weekdays:  1st contact [phone] 2nd contact [phone]  CC: itching, constipation, nausea/vomiting     HPI: [name] is a 65yo male with a past medical history of hypertension and COPD who presents with 5 months of itching, constipation, and tea-colored urine for 4 days. He had an abdominal ultrasound today in clinic as a work up for elevated transaminases and previously had called the clinic this morning complaining of constipation and vomiting as described below.  He was then admitted to the hospital.   [name] has experienced diffuse itching for 5 months that has been non-responsive to multiple medications including hydroxyzine, benadryl, diphenhydramine, zyrtec, desonide, and mirtazapine. This itching is constantly present and is diffuse and was not associated with a rash. He states that it has not changed over the past 5 months and nothing has improved it.   He did not have a bowel movement for 4 days and last night he was unable to defecate with severe straining. He disimpacted himself and saw streaks of blood on his stool. He then took 3 cups of epson salts which did not provoke a bowel movement but he then became nauseous and threw up. He states that the vomit did not contain any blood and was non-bilious. He had a bowel movement this morning without any problems and did not see any blood. He states that his stool is never black in color but that he is often conatipated and usually has a bowel movement every 2 days with straining.   Also 3 days ago. he had severe pain with urinating and stated that his urine was "coffee-colored." Since then his urine has been "tea-colored" and he has continued to have dysuria although not as severe as the first episode.   He is currently not experiencing pain. His wife reports that he may have been on an  antibiotic the past few days but she is not sure.   ALLERGIES: ! CELEBREX: itching ! SULFA: itching with hives  PAST MEDICAL HISTORY: Elevated transaminases suspected secondary to alcohol abuse Hypertension Emphysema Left ear deafness Repeated right shoulder dislocation Right knee pain  Surgical History:  Right shoulder surgery at unknown time with last 10 years  MEDICATIONS: VENTOLIN HFA 108 (90 BASE) MCG/ACT AERS (ALBUTEROL SULFATE) use 1-2 puffs every 4 hours as needed for shortness of breath or wheezing SPIRIVA HANDIHALER 18 MCG CAPS (TIOTROPIUM BROMIDE MONOHYDRATE) 1 puff daily ADVAIR DISKUS 250-50 MCG/DOSE MISC (FLUTICASONE-SALMETEROL) 1 puff two times a day ALPRAZOLAM 0.5 MG TABS (ALPRAZOLAM) Take 1 tablet by mouth three times a day as needed for anxiety. GUAIATUSSIN AC 100-10 MG/5ML SYRP (GUAIFENESIN-CODEINE) Take one teaspoonful every 4 hours as needed for cough TRAMADOL HCL 50 MG TABS (TRAMADOL HCL) Take 1 tablet by mouth three times a day for pain. HYDROXYZINE HCL 50 MG TABS (HYDROXYZINE HCL) Take 1 tablet by mouth three times a day as needed for itching BENADRYL MAXIMUM STRENGTH 2 % CREA (DIPHENHYDRAMINE HCL) use as instructed DIPHENHYDRAMINE HCL 50 MG TABS (DIPHENHYDRAMINE HCL) Take 1 tablet by mouth three times a day as needed for itching KETOCONAZOLE 2 % CREA (KETOCONAZOLE) Apply cream to the affected area as directed. DESONIDE 0.05 % CREA (DESONIDE)  DILTIAZEM HCL 60 MG TABS (DILTIAZEM HCL) take 1 tab buy mouth twice daily Omeprazole 20mg  daily Mirtazapine 15mg  1/2 tab daily  SOCIAL HISTORY: Retired Alcohol Abuse: No EtOH for  past year, previously drank 1 pint of liquor per day Current Smoker: 1 PPD for 50 years Distant history of substance abuse including IVDU in his 35's.  Married, wife diagnosed with hepatitis C in 2002  FAMILY HISTORY: Emphysema, brother Breast Cancer, sister Diabetes, Mother Hypertension, Mother  ROS:  Positive cough productive of  white sputum which he has had for several years.  Fatigue for the past couple of months which patient attributes to his blood pressure medications.  "Athlete's foot" for past several months As per HPI, all other systems reviewed and negative.   VITALS: T: 98.3  P: 95  BP: 133/79  R: 20  O2SAT: 93%  ON: RA   PHYSICAL EXAM: General:  alert, well-developed, and cooperative to examination.   Head:  normocephalic and atraumatic.   Eyes:  vision grossly intact, pupils equal, pupils round, pupils reactive to light, icteric sclera.   Ears: extreme difficulty hearing, L > R  Mouth:  pharynx pink and moist, no erythema, and no exudates.   Neck:  supple, full ROM, no thyromegaly, no JVD. Lungs:  normal respiratory effort, no accessory muscle use, expiratory wheezes bilaterally half way up Heart:  normal rate, regular rhythm, no murmur, no gallop, and no rub.   Abdomen:  soft, mildly distended, non-tender, normal bowel sounds, no guarding, no rebound tenderness, no splenomegaly, liver edge approximately 1cm below the constal margin. Msk:  no joint swelling, no joint warmth, and no redness over joints.   Pulses:  2+ DP/PT pulses bilaterally Extremities:  No cyanosis, clubbing, edema Neurologic:  alert & oriented X3, cranial nerves II-XII intact, strength normal in all extremities, sensation intact to light touch. Skin:  turgor normal and no rashes.   Psych:  Oriented X3, memory intact for recent and remote, normally interactive, good eye contact, not anxious appearing, and not depressed appearing.  LABS:    COMPLETE ABDOMINAL ULTRASOUND   Comparison:  CT of 03/08/2007   Findings:   Gallbladder:  Normal, without wall thickening, stone, or   pericholecystic fluid.  Sonographic Murphy's sign was not elicited.   Common bile duct: Normal, at 4 mm.   Liver: Hepatomegaly, 19.0 cm.  Increased echogenicity, consistent   with steatosis.   IVC: Negative   Pancreas:  Negative   Spleen:  Normal in size  and echogenicity.   Right Kidney:  11.8 cm. No hydronephrosis.   Left Kidney:  11.0 cm. No hydronephrosis.   Abdominal aorta:  Nonaneurysmal without ascites.   IMPRESSION:   1.  Hepatomegaly and hepatic steatosis.   2.  No other explanation for elevated liver function tests.  LABS FROM 02/19/10: Tests: (1) CMP with Estimated GFR (2402)   Sodium                    139 mEq/L                   135-145   Potassium                 4.2 mEq/L                   3.5-5.3   Chloride                  106 mEq/L                   96-112   CO2  22 mEq/L                    19-32   Glucose              [H]  104 mg/dL                   16-10   BUN                       15 mg/dL                    9-60   Creatinine                0.90 mg/dL                  0.40-1.50   Bilirubin, Total     [H]  3.3 mg/dL                   4.5-4.0   Alkaline Phosphatase [H]  232 U/L                     39-117   AST/SGOT             [H]  59 U/L                      0-37   ALT/SGPT                  36 U/L                      0-53   Total Protein             6.9 g/dL                    9.8-1.1   Albumin                   3.8 g/dL                    9.1-4.7   Calcium                   9.6 mg/dL                   8.2-95.6 ! Est GFR, African American                             >60 mL/min                  >60 ! Est GFR, NonAfrican American                             >60 mL/min                  >60  Tests: (2) CBC with Diff (10010)   WBC                       8.5 K/uL                    4.0-10.5   RBC                       4.61  MIL/uL                 4.22-5.81   Hemoglobin                14.5 g/dL                   40.9-81.1   Hematocrit                42.8 %                      39.0-52.0   MCV                       92.8 fL                     78.0-100.0 ! MCH                       31.5 pg                     26.0-34.0   MCHC                      33.9 g/dL                   91.4-78.2   RDW                        14.4 %                      11.5-15.5   Platelet Count            261 K/uL                    150-400   Granulocyte %             65 %                        43-77   Absolute Gran             5.5 K/uL                    1.7-7.7   Lymph %                   22 %                        12-46   Absolute Lymph            1.8 K/uL                    0.7-4.0   Mono %                    10 %                        3-12   Absolute Mono             0.9 K/uL                    0.1-1.0   Eos %  3 %                         0-5   Absolute Eos              0.2 K/uL                    0.0-0.7   Baso %                    1 %                         0-1   Absolute Baso             0.1 K/uL                    0.0-0.1   Smear Review       RESULT: Criteria for review not met  Tests: (3) Gamma GT (GGT) (23130)   Gamma GT (GGT)       [H]  545 U/L                     7-51  Tests: (4) TSH (23280)   TSH                       1.145 uIU/mL                0.350-4.50  Tests: (5) Hepatitis B Surface Antigen (16109)  Hepatitis B Surface Antigen                             NEGATIVE                    NEGATIVE  Tests: (6) Hepatitis B Core Ab, Total (60454)  Hepatitis B Core Ab, Total                        [A]  POS                         NEGATIVE  Tests: (7) Hepatitis B Surface Antibody (23590)  Hepatitis B Surface Antibody                             NEG                         NEGATIVE  Tests: (8) Hepatitis A Ab, Total (09811)  Hepatitis A Ab, Total                        [A]  POS                         NEGATIVE  Tests: (9) Hepatitis C Antibody (91478)   Hepatitis C Antibody [A]  REACTIVE                    NEGATIVE          This test is for screening purposes only.  Reactive results should be     confirmed by an alternative method.  Suggest HCV Qualitative, PCR,     test code 29562.  Specimens will  be stable for reflex testing up to 3     days after collection.  Tests:  (10) HIV 1,2 Antibody, with Reflex (08657)   HIV Antibody              NON REACTIVE                NON REACTI  LABS 02/23/10 PENDING   ASSESSMENT AND PLAN:  65yo male with persistent itching, elevated transaminases, and a new diagnosis of hepatitis.  Constipation/N/V: Patient has historically had constipation. He also had a colonoscopy 05/2009 performed by Dr. Bosie Clos during which he had multiple polyps removed including 2 that were concerning for cancer. He was told to return in 2014 for a repeat colonoscopy. His nausea and vomiting which has now resolved is likely due to his ingestion of 3 caps of epson salts.  -Fecal occult blood negative x1 -Miralax 17g by mouth daily -Senna 1 tab by mouth daily -Consider CT abdomen with contrast to evaluate for elevated LFTs  -FOBT negative  Persistant itching: Patient has complained of 5 months of persistant itching, refractory to treatment. He has elevated bilirubin and transaminases, likely due to years of alcohol abuse and current infection with hepatitis B and C.   -viral load hepatitis B and C ordered -UDS ordered -repeat transaminases -send AFP, lipase -IV benadryl, hydroxyzine and mirtazipine for itiching.   Tea-colored urine:  Differential includes prostatitis, glomerulonephritis, and elevated bilirubin causing the dark coloration.  -Urianalysis + culture ordered   Anxiety: Stable. -Continue home Alprazolam 0.5mg  by mouth three times a day, as needed for anxiety  COPD: Per EMR was treated several time for COPD exac. No PFT found on records. Currently stable.  Oxygen requirement is unchanged and sputum production and consistency is unchanged.  -Continue home meds of Spiriva and Ventolin -Repeat CXR -May need PFT as outpatient.  VTE Proph: SCD's   ATTENDING: I performed and/or observed a history and physical examination of the patient.  I discussed the case with the residents as noted and reviewed the residents' notes.  I agree with  the findings and plan--please refer to the attending physician note for more details.  Signature________________________________  Printed Name_____________________________

## 2010-03-03 NOTE — Progress Notes (Signed)
Summary: Constipation  Phone Note Call from Patient   Caller: Spouse Summary of Call: Pt having vomiting after taking Epsom salt.  Pt threw up.  Pt tried to have a bowel movement that was constipated.  Has been constipated for 4 days.   Pt used his finger to get some of the stool out. Pt has been having problems since his exam on his stomach.  Pt has been weak.  Pt had a Colonoscopy done that showed polopys.  Pt is currently in Radiology to have liver and Gall bladder ultrasound done.  Nothing sine 12 last night.  No pain in his stomach.  Pains when he tries to void.  No blood in the urine just dark.  When he tries to pass a stool he has blood.  Noticed the blood after using his finger to get the stool out.  No appetite and stomah is swollen.   No fevers or chills.  Pt feels really bad. Angelina Ok RN  February 23, 2010 9:23 AM  Initial call taken by: Angelina Ok RN,  February 23, 2010 9:24 AM  Follow-up for Phone Call        Reviewed 1/27 labwork. Will have patient admitted. Follow-up by: Julaine Fusi  DO,  February 23, 2010 11:20 AM

## 2010-03-13 NOTE — Discharge Summary (Signed)
[name], [name]               ACCOUNT NO.:  [id number]  MEDICAL RECORD NO.:  [id number]           PATIENT TYPE:  I  LOCATION:  4739                         FACILITY:  [facility]  PHYSICIAN:  Doneen Poisson, MD     DATE OF BIRTH:  [birth year]  DATE OF ADMISSION:  02/23/2010 DATE OF DISCHARGE:  02/25/2010                              DISCHARGE SUMMARY   DISCHARGE DIAGNOSES: 1. Hepatitis. 2. Pruritus. 3. Urinary tract infection. 4. Chronic obstructive pulmonary disease. 5. Hypertension. 6. Depression. 7. Cigarette smoking.  DISCHARGE MEDICATIONS:  The patient will be discharged with an AeroChamber and Inspirease. 1. Albuterol 90 mcg aerosolized inhaler 1-2 puff every 4 hours as     needed. 2. Bupropion 150 mg XL tablet daily. 3. Carvedilol 12.5 mg tablet taken twice a day. 4. Cholestyramine 4 g packet taken twice daily. 5. Ciprofloxacin 250 mg tablet taken twice daily. 6. Polyethylene glycol 3350 powder 17 g taken daily. 7. Senna 1 tablet taken daily. 8. Alprazolam 0.25 mg taken twice daily with the reduction to 1 tablet     daily starting on March 05, 2010. 9. Advair Diskus 250/50 one puff inhaled twice daily. 10.Aspirin 81 mg 1 tablet daily. 11.Benadryl maximum strength 2% cream as needed topically for itching. 12.Diphenhydramine 50 mg 1 capsule taken up to 3 times a day as needed     for itching. 13.Spiriva 18 mcg 1 capsule inhaled daily. 14.Tramadol 50 mg 1 tablet taken up 3 times a day as needed for right     knee pain. 15.Ventolin inhaler 90 mcg 1-2 puffs every 4 hours as needed.  He is to stop taking the following medications guaifenesin, hydroxyzine, Zyrtec, ketoconazole, desonide, which he was taking for itching, which is improved now that he is on cholestyramine, and also he is to stop taking diltiazem 60 mg b.i.d., as he was switched to carvedilol.  DISPOSITION AND FOLLOWUP:  He as an appointment scheduled in the outpatient clinic at [facility] for March 23, 2010, at 9:15 a.m. with Dr. Dorthula Rue.   -Check LFT's including GGT. -Follow up Hepatitis B and C viral load -Check BP given new start of carvedilol and discontinuance of diltiazem.  -Evaluate depression after weaning off Xanax and starting of Wellbutrin -Smoking cessation counseling -May need formal pulmonary follow up with PFT's -Follow up urine cultures from this hospitilzation. >100,000 gram negative rods  treated with Cipro 250mg  BID x 3 days. Susceptibilities were not back at time of  discharge and if organism was not susceptible to Cipro he may need another UA/Culture.  No procedures were performed during this hospitalization.  No consultations were ordered during this hospitalization.  BRIEF ADMITTING HISTORY AND PHYSICAL:  [name] is a 65 year old man with the past medical history of hypertension, COPD, and depression who presented with 5 months of itching, recent constipation, and tea-colored urine for the past 4 days.  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  He was then admitted to the hospital.  [name] had experienced diffuse  itching for 5 months that was nonresponsive to multiple medications including hydroxyzine, Benadryl, diphenhydramine, Zyrtec, desonide, and mirtazapine.  His itching has been constantly present and was diffuse and not associated with any rash.  He states that it has not really changed much over the past 5 months and nothing has improved it.  [name] also states that he did not have a bowel movement for 4 days prior to admission, and the night before admission, he was unable to defecate with severe straining. At that time, he disimpacted himself and saw streaks of blood in his stool.  He then took 3 cups of Epsom salt, which did not provoke a bowel movement, but he then became nauseated and threw up.  He states that the vomit  did not contain any blood and was nonbilious.  He then had a bowel movement the morning of admission without any problems and did not see any blood in his stool.  He stated that his stool was never black in color, and that he is often been constipated and usually has a bowel movement approximately every 2 days with straining.   Also 3 days prior to admission, he had severe pain while urinating and stated that his urine was "coffee colored."  Since his initial episode, his urine remained "tea colored", and he continued to have dysuria, although not as severe as his first episode.  He also complained of increased frequency with urination, but did not have incomplete voiding.  He also did not complain of suprapubic pain or costovertebral angle tenderness.  On admission, he was not experiencing any pain.  ALLERGIES:  SULFA, which caused itching and hives and Celebrex, which caused itching.  PAST MEDICAL HISTORY:  Includes elevated transaminases suspected secondary to alcohol abuse, hypertension, COPD, left ear deafness, repeated right shoulder dislocation, right knee pain secondary to osteoarthritis, and depression.  LABORATORY DATA:  From clinic visit on February 19, 2010, GGT 545, hepatitis B surface antigen negative,  hepatitis B core antibody positive, hepatitis B surface antibody negative, hepatitis A antibody positive, hepatitis C antibody reactive.  HIV antibody nonreactive.  Labs on admission on February 23, 2010, WBC 11.3, hemoglobin 11.4, hematocrit 41.6, platelet count 316.  Chemistries within normal limits. Total bilirubin 3.8, alkaline phosphatase 236, AST 62, ALT 46, GGT 392, total protein 7.3, albumin 3.5, calcium 9.2, prothrombin time 12.0, PTT 30, cholesterol total 157, triglycerides 290, HDL 28, LDL 71, VLDL 58.  TSH 0.729, alpha-fetoprotein 7.7.  UA positive for moderate bilirubin, 15 ketones, small blood, 100 protein, 4.0 urobilinogen, negative nitrites, moderate  leukocytes, few bacteria.  White blood cells too numerous to count, 3-6 red blood cells.  Urine culture positive for greater than 100,000 gram-negative rods.  HOSPITAL COURSE: 1. Hepatitis.  [name] was admitted with elevated GGT, alk phos,     total bilirubin and AST.  He also had obvious scleral icterus,      diffuse pruritus, and a liver edge 1 cm below the costal margin.  He      had recent testing, which suggested infection with hepatitis C and      hepatitis B. The hepatitis B serology showed an atypical pattern which      can be indicative of resolution or low level chronic infection. Viral loads     are pending to determine if these infections are currently active or if     they have resolved.  He was given cholestyramine 4 g b.i.d., which     resulted in  the decrease of his total bili from 3.8 to 2.7 and also     resulted in dramatic decrease in his diffuse itching.  Other     itching medications have been discontinued with the exception of     Benadryl, and on discharge, his mirtazapine was also discontinued     as this did not appear to be helping with itching, and could possibly increase     his appetite and result in weight gain.  He will be followed up in outpatient      clinic regarding his hepatitis. Also of note, abdominal ultrasound showed      hepatomegaly with concern for steatotic hepatitis.  2. Persistent itching.  This is likely related to his increased     bilirubin due to his active hepatitis and improved with the     administration of cholestyramine.  We will continue this medication     at home.  3. Urinary tract infection.  [name] came in with 4 days of     dysuria and dark-colored urine.  The dysuria was likely due to      cystitis with a positive UA and urine culture with greater than      100,000 gram-negative rods.  He was treated with 3 days of      ciprofloxacin 250 mg b.i.d., which he will complete on February 26, 2010.  On the second day of  this hospitalization, the dysuria      resolved.  The dark-colored urine remained, but this is likely      due to his elevated bilirubin.  4. Hypertension.  On admission, [name] was on 60 mg of diltiazem,      and his hypertension was not perfectly controlled with blood      pressures in the 130s to 160s systolic.  Diltiazem was     discontinued and Coreg 12.5 mg b.i.d. was begun for primary     prophylaxis given his liver disease and also his heart rate was     mostly in the 80s to 90s, and therefore he had room for heart rate     decrease on the beta blocker.  This may need to be titrated up     as an outpatient to gain control of his hypertension and reduce his     systolic blood pressure to less than 130.  His blood pressure was     stable during the admission.  5. COPD.  [name] was previously told that he has a diagnosis of      "emphysema." He has been seen in the outpatient clinic for      exacerbation of his COPD, but it appears that he has never had a      formal work up.  Upon admission, he had loud expiratory wheezes      bilaterally at the bases.  He was treated with nebulizers and these      improved during the course of the hospitalization.  We will continue      him on his home medications and recommend that he get a formal work up      including PFTs as an outpatient.  We also provided him with a spacer      for his inhaler and MDI teaching as he has not been compliant with all      of his inhalers at home and may not have received proper teaching on  how to use them effectively.  He did not require oxygen during his      stay.  His oxygen saturations remain stable between 91% and 94% on      room air.  He did not complain of any difficulty breathing or any cough      while he was hospitalized.  6. Constipation, nausea, and vomiting.  Upon admission, he was no     longer nauseated nor did he vomit the rest of admission. For his     constipation, he was  started on a bowel regimen of MiraLax and     senna, which produced a bowel movement each day of his     hospitalization with less straining and difficulty.  He never saw     blood again in his stools and his fecal occult blood test here was     negative.  He is encouraged to continue this bowel regimen at home     to avoid constipation.  7. Anxiety.  [name] states that he was originally started on     Prozac 2-3 years ago with Xanax as a bridge until the effectiveness     of Prozac begin.  He had side effects to Prozac that were mainly     agitation and stopped the Prozac and has continued on Xanax since     then.  On this admission, we started him on Wellbutrin and also     planned to taper his Xanax, which he agreed with. Additionally,     the Wellbutrin may help with his new goal of smoking cessation in     the near future.  He is currently on 150 extended release of     Wellbutrin, and this could be increased to 300 if desired effect is     not seen at the time of his next visit.  8. Cigarette smoking.  [name] states that he wishes to quit     smoking and he has never really had formal counseling and would     like to try smoking cessation aids.  DISCHARGE LABS:  CBC unremarkable.  Chemistries within normal limits. Urine culture positive for greater than 100,000 gram-negative rods, susceptibility is pending.  Bilirubin 2.7, alkaline phosphatase 202, AST 64, ALT 45, total protein 7, albumin 3.3, calcium 9.0.  VITALS ON DAY OF DISCHARGE:  Temperature  98.7, pulse 87, respirations 18, blood pressure 131/62, Os Sat 92% on room air.   ______________________________ Darnelle Maffucci, MD   ______________________________ Doneen Poisson, MD   PT/MEDQ  D:  02/25/2010  T:  02/26/2010  Job:  [id number]  Electronically Signed by Darnelle Maffucci  on 03/11/2010 07:16:56 PM Electronically Signed by Doneen Poisson  on 03/13/2010 06:40:05 PM

## 2010-03-19 ENCOUNTER — Other Ambulatory Visit: Payer: Self-pay | Admitting: *Deleted

## 2010-03-19 DIAGNOSIS — F329 Major depressive disorder, single episode, unspecified: Secondary | ICD-10-CM

## 2010-03-19 DIAGNOSIS — I10 Essential (primary) hypertension: Secondary | ICD-10-CM

## 2010-03-19 DIAGNOSIS — F419 Anxiety disorder, unspecified: Secondary | ICD-10-CM

## 2010-03-19 DIAGNOSIS — K759 Inflammatory liver disease, unspecified: Secondary | ICD-10-CM

## 2010-03-22 ENCOUNTER — Other Ambulatory Visit: Payer: Self-pay | Admitting: Internal Medicine

## 2010-03-22 ENCOUNTER — Other Ambulatory Visit: Payer: Self-pay | Admitting: *Deleted

## 2010-03-22 DIAGNOSIS — M1990 Unspecified osteoarthritis, unspecified site: Secondary | ICD-10-CM

## 2010-03-22 MED ORDER — CHOLESTYRAMINE 4 G PO PACK: PACK | ORAL | Status: DC

## 2010-03-22 MED ORDER — CARVEDILOL 12.5 MG PO TABS: 12.5000 mg | ORAL_TABLET | Freq: Two times a day (BID) | ORAL | Status: DC

## 2010-03-22 MED ORDER — TRAMADOL HCL 50 MG PO TABS: 50.0000 mg | ORAL_TABLET | Freq: Three times a day (TID) | ORAL | Status: DC | PRN

## 2010-03-22 MED ORDER — ALPRAZOLAM 0.5 MG PO TABS: 0.5000 mg | ORAL_TABLET | Freq: Three times a day (TID) | ORAL | Status: DC | PRN

## 2010-03-22 MED ORDER — BUPROPION HCL ER (XL) 150 MG PO TB24: 150.0000 mg | ORAL_TABLET | ORAL | Status: DC

## 2010-03-23 ENCOUNTER — Other Ambulatory Visit: Payer: Self-pay | Admitting: Internal Medicine

## 2010-03-23 ENCOUNTER — Ambulatory Visit (INDEPENDENT_AMBULATORY_CARE_PROVIDER_SITE_OTHER): Payer: Medicare Other | Admitting: Internal Medicine

## 2010-03-23 ENCOUNTER — Encounter: Payer: Self-pay | Admitting: Internal Medicine

## 2010-03-23 VITALS — BP 119/81 | HR 64 | Temp 97.3°F | Ht 68.0 in | Wt 241.2 lb

## 2010-03-23 DIAGNOSIS — L299 Pruritus, unspecified: Secondary | ICD-10-CM

## 2010-03-23 DIAGNOSIS — I10 Essential (primary) hypertension: Secondary | ICD-10-CM

## 2010-03-23 DIAGNOSIS — B1920 Unspecified viral hepatitis C without hepatic coma: Secondary | ICD-10-CM

## 2010-03-23 LAB — HEPATIC FUNCTION PANEL
AST: 36 U/L (ref 0–37)
Alkaline Phosphatase: 217 U/L — ABNORMAL HIGH (ref 39–117)
Bilirubin, Direct: 1.4 mg/dL — ABNORMAL HIGH (ref 0.0–0.3)
Total Bilirubin: 2.4 mg/dL — ABNORMAL HIGH (ref 0.3–1.2)

## 2010-03-23 MED ORDER — PRAMOXINE HCL 1 % EX GEL: 1.0000 | Freq: Three times a day (TID) | CUTANEOUS | Status: DC

## 2010-03-23 MED ORDER — ALPRAZOLAM 0.5 MG PO TABS: ORAL_TABLET | ORAL | Status: DC

## 2010-03-23 MED ORDER — NALTREXONE HCL 50 MG PO TABS: 50.0000 mg | ORAL_TABLET | Freq: Every day | ORAL | Status: DC

## 2010-03-23 NOTE — Assessment & Plan Note (Signed)
Elevated transaminases during the hospitalization, and this is most likely secondary to acute hepatitis. His viral load hepatitis C is 440,000. We will obtain liver function tests today to ensure they're trending down and will obtain GGT levels as well.

## 2010-03-23 NOTE — Assessment & Plan Note (Signed)
This is a new diagnosis for patient confirmed with HCV RNA and viral load of 440,000. I have discussed this case with Dr. Reche Dixon and he agrees that patient needs treatment for hepatitis C. We will fill out the referral for hepatitis clinic which is located at [address].

## 2010-03-23 NOTE — Progress Notes (Signed)
Pt and wife aware info faxed to Hep C clinic - Hep C will call pt with appt.  Stanton Kidney Jaydon Soroka RN 03/23/10 4:30PM

## 2010-03-23 NOTE — Progress Notes (Signed)
  Subjective:    Patient ID: [name], male    DOB: [birth year], 65 y.o.   MRN: [id number]  HPI  patient is 65 year old male with past medical history outlined below who presents to clinic for regular follow up on his recent hospitalization. She was hospitalized January 31 and was discharged February 25, 2010. Reason for his hospitalization was persistent itching that started 6 months ago and has not been getting better after trial of several different and topical medications. In addition his concern was persistent dysuria and and urinary frequency and urgency. During the hospitalization he was started he has hepatitis C. And perhaps hepatitis B with urinary tract infection. He was advised to take antibiotic once discharged. He was also advised to follow up in clinic to have his liver tests rechecked.  He denies any episodes of chest pain, shortness of breath, palpitations, fevers or chills, night sweats, weight loss or gain. He is persistently bothered by continuous itching and would like to know if there's anything else for this to be controlled.   Review of Systems  Constitutional: Negative.   HENT: Negative.   Eyes: Positive for itching. Negative for photophobia, pain, redness and visual disturbance.  Respiratory: Negative.   Cardiovascular: Negative.   Gastrointestinal: Negative.   Genitourinary: Negative.   Musculoskeletal: Negative.   Neurological: Negative.   Hematological: Negative.   Psychiatric/Behavioral: Negative.        Objective:   Physical Exam  Constitutional: He is oriented to person, place, and time. He appears well-developed and well-nourished. No distress.  HENT:  Head: Normocephalic and atraumatic.  Right Ear: External ear normal.  Left Ear: External ear normal.  Nose: Nose normal.  Mouth/Throat: Oropharynx is clear and moist. No oropharyngeal exudate.  Eyes: EOM are normal. Pupils are equal, round, and reactive to light. Right eye exhibits no discharge. Left  eye exhibits no discharge. Scleral icterus is present.  Neck: Normal range of motion. Neck supple. No thyromegaly present.  Cardiovascular: Normal rate, regular rhythm, normal heart sounds and intact distal pulses.  Exam reveals no gallop and no friction rub.   No murmur heard. Pulmonary/Chest: Effort normal. No respiratory distress. He has wheezes. He has no rales. He exhibits no tenderness.  Abdominal: Soft. Bowel sounds are normal. He exhibits no distension and no mass. There is no tenderness. There is no rebound, no guarding, no tenderness at McBurney's point and negative Murphy's sign.  Musculoskeletal: He exhibits no edema and no tenderness.  Neurological: He is alert and oriented to person, place, and time. He has normal reflexes. No cranial nerve deficit. Coordination normal.  Skin: No rash noted. Rash is not macular, not papular, not nodular, not pustular and not vesicular. He is not diaphoretic. No cyanosis or erythema. Nails show no clubbing.          Area of hypopigmentation consistent with  Athlete's foot          Assessment & Plan:

## 2010-03-23 NOTE — Assessment & Plan Note (Signed)
This is a chronic problem for > 6 months for the patient and he has tried several different oral and topical medications to control itching without success. I have discussed this case with Dr. Reche Dixon and we have agreed to start patient on Naltrexone, per up-to-date recommended dose is 25-50 mg once daily. We will try this medication first and will follow up with patient his feet he has any relief from itching. Patient was also advised to continue taking cholestyramine one packet before breakfast and other packet after the breakfast. Another potential medication we can try if no tracts and is not working is ursodeoxycholic acid and I have discussed this plan with patient. He verbalizes understanding and agrees with the plan. Dr.Talbot agrees that managing hepatitis may be helpful with management of itching. Patient was also advised to continue taking Benadryl PO at nighttime he can continue taking Xanax to help with sleep. In general is also recommended per up-to-date as a topcal Gel for potential relief of itching.

## 2010-03-23 NOTE — Patient Instructions (Signed)
Please schedule follow up appointment in 3 months. Let us known if medications that we started today are helping with relief of itching. If you experienced no relief of itching in next 2-3 weeks please call so that we can readjust your medication regimen.

## 2010-03-23 NOTE — Assessment & Plan Note (Signed)
Currently at goal, diltiazem was discontinued and Coreg initiated. We'll continue the same regimen and we'll continue to reassess blood pressure.

## 2010-03-23 NOTE — Telephone Encounter (Signed)
Needs to get # 90 of the Alprazolam for 1 month.  Taking 3 times a day.

## 2010-03-23 NOTE — Telephone Encounter (Signed)
The script from 02/25/10 was never picked up, please refuse this new request, that script was called in today

## 2010-04-08 LAB — POCT I-STAT, CHEM 8
Calcium, Ion: 1.13 mmol/L (ref 1.12–1.32)
Chloride: 104 mEq/L (ref 96–112)
HCT: 49 % (ref 39.0–52.0)
Hemoglobin: 16.7 g/dL (ref 13.0–17.0)
Potassium: 3.8 mEq/L (ref 3.5–5.1)

## 2010-04-08 LAB — SEDIMENTATION RATE: Sed Rate: 5 mm/hr (ref 0–16)

## 2010-04-08 LAB — CBC
Hemoglobin: 15.9 g/dL (ref 13.0–17.0)
Platelets: 214 10*3/uL (ref 150–400)
RBC: 4.87 MIL/uL (ref 4.22–5.81)
WBC: 8 10*3/uL (ref 4.0–10.5)

## 2010-04-08 LAB — TSH: TSH: 2.018 u[IU]/mL (ref 0.350–4.500)

## 2010-04-14 ENCOUNTER — Encounter: Payer: Medicare Other | Admitting: Internal Medicine

## 2010-04-15 ENCOUNTER — Encounter: Payer: Self-pay | Admitting: Internal Medicine

## 2010-04-15 ENCOUNTER — Ambulatory Visit (INDEPENDENT_AMBULATORY_CARE_PROVIDER_SITE_OTHER): Payer: Medicare Other | Admitting: Internal Medicine

## 2010-04-15 VITALS — BP 122/76 | HR 67 | Temp 98.7°F | Ht 68.0 in | Wt 241.9 lb

## 2010-04-15 DIAGNOSIS — Z720 Tobacco use: Secondary | ICD-10-CM | POA: Insufficient documentation

## 2010-04-15 DIAGNOSIS — I10 Essential (primary) hypertension: Secondary | ICD-10-CM

## 2010-04-15 DIAGNOSIS — J449 Chronic obstructive pulmonary disease, unspecified: Secondary | ICD-10-CM

## 2010-04-15 DIAGNOSIS — L299 Pruritus, unspecified: Secondary | ICD-10-CM

## 2010-04-15 DIAGNOSIS — F17200 Nicotine dependence, unspecified, uncomplicated: Secondary | ICD-10-CM

## 2010-04-15 DIAGNOSIS — B1920 Unspecified viral hepatitis C without hepatic coma: Secondary | ICD-10-CM

## 2010-04-15 MED ORDER — PRAMOXINE HCL 1 % EX GEL: 1.0000 | Freq: Three times a day (TID) | CUTANEOUS | Status: DC

## 2010-04-15 MED ORDER — OMEPRAZOLE 40 MG PO CPDR: 40.0000 mg | DELAYED_RELEASE_CAPSULE | Freq: Every day | ORAL | Status: DC

## 2010-04-15 NOTE — Assessment & Plan Note (Signed)
No SOB, mild to moderate wheezing, will continue current meds. Advised quit smoking.

## 2010-04-15 NOTE — Assessment & Plan Note (Addendum)
Advised him to quit smoking and he said he would like to cut first. Give him education material and have social worker counseling for this.

## 2010-04-15 NOTE — Assessment & Plan Note (Signed)
Lab Results  Component Value Date   NA 136 02/25/2010   K 4.4 02/25/2010   CL 104 02/25/2010   CO2 20 02/25/2010   BUN 20 02/25/2010   CREATININE 1.03 02/25/2010    BP Readings from Last 3 Encounters:  04/15/10 122/76  03/23/10 119/81  02/18/10 139/77    BP well controlled and continue current meds.

## 2010-04-15 NOTE — Assessment & Plan Note (Addendum)
His paperwork has been sent to Hepatitis Clinic and he is on the waiting list.

## 2010-04-15 NOTE — Progress Notes (Signed)
Subjective:    Patient ID: [name], male    DOB: 03/08/1945, 65 y.o.   MRN: [id number]  HPI Patient is a 65 years old male with past medical history  as outlined here who comes to the Clinic for regular f/u for his itching. He saud his itching has much better without use of naltrexone and pramoxine, at least improved 50%. No other c/o, including fever, chill, chest pain, shortness of breath, hemoptysis, abdominal pain, nausea, vomiting, diarrhea, melena, dysuria, significant weight change.  Still smoker about 1 PPD, no alcohol or drug abuse. Has sent the paper to hepatitis clinic for his HCV treatment.    Review of Systems Per HPI.  Current Outpatient Medications Current Outpatient Prescriptions  Medication Sig Dispense Refill  . albuterol (VENTOLIN HFA) 108 (90 BASE) MCG/ACT inhaler Inhale 1-2 puffs into the lungs every 4 (four) hours as needed. For shortness of breath or wheezing.       Marland Kitchen ALPRAZolam (XANAX) 0.5 MG tablet Take 3 tablets at night time.  90 tablet  3  . aspirin 81 MG tablet Take 81 mg by mouth daily.        . carvedilol (COREG) 12.5 MG tablet Take 1 tablet (12.5 mg total) by mouth 2 (two) times daily.  60 tablet  5  . cholestyramine (QUESTRAN) 4 G packet Use 1 Packet as directed twice daily  60 each  11  . diphenhydrAMINE (BENADRYL MAXIMUM STRENGTH) 2 % cream Apply topically. Use as instructed.       . Fluticasone-Salmeterol (ADVAIR DISKUS) 250-50 MCG/DOSE AEPB Inhale 1 puff into the lungs 2 (two) times daily.        Marland Kitchen omeprazole (PRILOSEC) 40 MG capsule Take 40 mg by mouth daily.        Marland Kitchen tiotropium (SPIRIVA) 18 MCG inhalation capsule Place 18 mcg into inhaler and inhale daily.        . traMADol (ULTRAM) 50 MG tablet Take 1 tablet (50 mg total) by mouth 3 (three) times daily as needed. For pain.  90 tablet  2  . buPROPion (WELLBUTRIN XL) 150 MG 24 hr tablet Take 1 tablet (150 mg total) by mouth every morning.  30 tablet  5  . naltrexone (DEPADE) 50 MG tablet Take 1  tablet (50 mg total) by mouth daily.  30 tablet  1  . Pramoxine HCl 1 % GEL Apply 1 Tube topically 3 (three) times daily.  1 Bottle  1    Allergies Celecoxib and Sulfonamide derivatives  Past Medical History  Diagnosis Date  . Hepatitis C 2012     a hepatitis panel done February 19, 1998 showed anti-HBc +, anti-HBV +, hep C ab reactive, February 23, 2010 hepatitis B DNA not detected, HCV RNA  VL = 444000  . Abnormal liver function tests 01/2010     on hospital admission February 23, 2010 AP = 36, AST = 62, ALT = 40, GGT = 392  . COPD (chronic obstructive pulmonary disease)   . Hypertension   . Hyperlipidemia   . GERD (gastroesophageal reflux disease)   . Anxiety   . Depression   . Pruritus 06/2009     this is most likely secondary to cholestasis, last bilirubin 2.7 (02-25-2010)    Past Surgical History  Procedure Date  . Open anterior shoulder reconstruction 2003     surgery performed June 26, 2001 by Dr. Rennis Chris, open right shoulder anterior reconstruction secondary to recurrent right shoulder anterior inferior dislocation  Objective:   Physical Exam General: Vital signs reviewed and noted. Well-developed,well-nourished,obese, in no acute distress; alert,appropriate and cooperative throughout examination. Head: normocephalic, atraumatic. Neck: No deformities, masses, or tenderness noted. Lungs: Normal respiratory effort. Upper lung mild to moderate wheezing,  without crackles or wheezes.  Heart: RRR. S1 and S2 normal without gallop, murmur, or rubs.  Abdomen: BS normoactive. Soft, Nondistended, non-tender.  No masses or organomegaly. Extremities: No pretibial edema.         Assessment & Plan:

## 2010-04-15 NOTE — Patient Instructions (Signed)
Please take all your medications as instructed in your instructions.   Please call the Clinic for appointment or go to Emergency Department if your symptoms do not improve or get worse.   

## 2010-04-15 NOTE — Assessment & Plan Note (Signed)
His itching has been much better even w/o use of naltrexone and pramoxine gel. Will continue use current meds and recommend it to use pramoxine and naltrexone if no improvement. He understands these.

## 2010-04-20 ENCOUNTER — Telehealth: Payer: Self-pay | Admitting: Licensed Clinical Social Worker

## 2010-04-27 ENCOUNTER — Encounter: Payer: Self-pay | Admitting: Licensed Clinical Social Worker

## 2010-04-27 NOTE — Telephone Encounter (Signed)
Sent letter to the home encouraging patient to either join the Quitline or to call [facility] to schedule appmt.

## 2010-05-07 ENCOUNTER — Telehealth: Payer: Self-pay | Admitting: *Deleted

## 2010-05-07 NOTE — Telephone Encounter (Signed)
Pt's wife called requesting a Rx for the Shingles Vaccine.  She will take it to her pharmacy and they  Will give the injection.  Wife is anxious because pt was in contact with relative that has shingles. Home # [phone]

## 2010-05-07 NOTE — Telephone Encounter (Signed)
Talked with Dr Josem Kaufmann  And Jasmine December.  We don't give the vaccine in clinic but referred pt the the [facility]. She was given the # to make an appointment with them. [phone]

## 2010-06-11 NOTE — Discharge Summary (Signed)
NAMEJAYVIAN, [name]               ACCOUNT NO.:  [id number]   MEDICAL RECORD NO.:  [id number]          PATIENT TYPE:  INP   LOCATION:  3730                         FACILITY:  [facility]   PHYSICIAN:  Marinda Elk, M.D.DATE OF BIRTH:  April 28, 1945   DATE OF ADMISSION:  09/27/2005  DATE OF DISCHARGE:  09/28/2005                                 DISCHARGE SUMMARY   CONSULTING DOCTOR:  Marinda Elk, M.D.   CONSULTANTS:  None.   DISCHARGE DIAGNOSES:  1. Atypical Chest Pain most consisten with gastritis vs PUD.  2. History of melena.  3. Polysubstance abuse.  4. Tobacco abuse.  5. Hypertriglyceridemia.  6. Osteoarthritis.   DISCHARGE MEDICATIONS:  1. Prilosec.  2. Tylenol 650 mg q.6 h. for pain.   PROCEDURES PERFORMED:  1. 2D echo showed normal ventricular systolic function with an ejection      fraction of 60-75% and no abnormality.  2. Lower extremity Doppler ultrasound showed no evidence of DVT.   DISPOSITION:  The patient is to follow up with Dr. Radonna Ricker in the outpatient  clinic of [facility] on October 21, 2005 where his blood pressure will be  rechecked and his creatinine will be evaluated with a BMET, also will see if  symptoms have resolved with current therapy, also will follow up with a  fasting lipid panel to be evaluated and check for high triglycerides and if  needed start medication.   The patient has an appointment at [facility] on October 03, 2005  for a stress test, given patien has HTN, Hypertrigliciredemi and cocaine   The patient has an appointment with Dr. Loreta Ave to evaluate his history of  melena, which he had before coming into the hospital.   ADMITTING HISTORY AND PHYSICAL:  A 65 year old African American male with  past medical history of tobacco use, cocaine and alcohol abuse.  He comes to  the ED because of left-side chest pain that began 2 days prior to admission.  He described his pain as sharp, localized to the inferior left side  of his  rib cage, 8/10 in intensity, around the anterior axillary line and  epigastrium.  The patient had no radiation to the arm or jaw.  Has no  shortness of breath or no exertional chest pain.  Pain is worse by movement,  improves with leaning forward, and is worse by lying down.  The patient is  to be evaluated by Dr. Loreta Ave GI for melena as an outpatient.   PHYSICAL EXAMINATION:  CV exam:Left lower lung quadrant crackles, positive  bowel sounds.  Abdomen soft, mild tenderness to palpation over the  epigastrium.  Patient refused rectal exam.   LABORATORY STUDIES:  White blood cells of 9.7, hemoglobin of 15.3, platelets  of 260,000, and an MCV of 97.  PT was 11.9, INR was 0.9.  Sodium of 139,  potassium of 3.8, chloride of 108, bicarb of 24, BUN of 19, creatinine of  1.1, and glucose of 117.  Bilirubin was 0.7, alkaline phosphatase was 87,  AST was 21, ALT was 23.  Albumin of 3.6.  D-dimer of  0.31.  Cardiac enzymes  were negative x3.  Chest x-ray showed no acute findings.  U/S Doppler of  lower extremities negative for DVT.  Urine drug screen positive for opiates,  which were given in the ER.  EKG showed non-specific ST segment changes.   HOSPITAL COURSE:  1. AtypicalChest Pain 2/2 to Gastritis versus peptic ulcer disease:  In      light of his excessive use of NSAIDs and alcohol, gastritis and peptic      ulcer disease are considered.  The patient was rule out for an MI with      cardiac enzymes and an EKG, which showed no acute changes and cardiac      enzymes were negative.  The patient was started on Protonix p.o. and by      the next day the patient referred the pain was much better and he was      asymptomatic with no pain.  2. History of melena:  The patient was in the process as an outpatient for      evaluation for melena, being worked up by Dr. Loreta Ave as an outpatient.      The patient has an appointment with Dr. Loreta Ave to work up his GI      bleeding.  During his stay at the  hospital, his hemoglobin remained      stable and no actions were taken.  3. Polysubstance abuse:  The patient's LFTs and coagulation panel, both of      them within normal limits.  The patient showed no signs of DT or      withdrawal symptoms.  The patient was started thiamin and folate and      was given advice about his drinking.  4. Tobacco abuse:  No actions were taken.  The patient refers that he will      get the nicotine patch and try to quit.  Will be followed up as an      outpatient.  5. Hypertriglyceridemia:  Will repeat triglycerides as an outpatient with      overnight fasting to evaluate his triglycerides.  Values are thought to      be altered because glucose was high at the same time.  Could have been      the patient was not fasting at the time of drawing the labs.  6. Osteoarthritis:  The patient was taking NSAIDs and was changed to      Tylenol 650 mg for the pain.   LABORATORY STUDIES:  White blood cells are 9.0, hemoglobin of 15.3,  hematocrit of 43.6, platelets of 260,000, MCV of 96.6.  Sodium is 139,  potassium is 3.8, chloride is 108, bicarb is 24, BUN is 19, creatinine is  1.1, and glucose is 117.  Magnesium is 2.0.  Fasting lipid panel showed  cholesterol of 170, triglycerides of 444, HDL of 37, LDL not reported.   VITAL SIGNS:  Temperature is 98.5, pulse is 73, respirations 20, blood  pressure is 145/57, and oxygen saturation is 95% on room air.      Marinda Elk, M.D.  Electronically Signed     AF/MEDQ  D:  09/29/2005  T:  09/29/2005  Job:  [id number]   cc:   Alvester Morin, M.D.  Anselmo Rod, M.D.

## 2010-06-11 NOTE — Op Note (Signed)
[place]  Patient:    [name], MICHAUX Visit Number: [id number] MRN: [id number]          Service Type: DSU Location: 5000 [id number] Attending Physician:  Cain Sieve Dictated by:   Vania Rea. Supple, M.D. Proc. Date: 06/26/01 Admit Date:  06/26/2001 Discharge Date: 06/27/2001                             Operative Report  PREOPERATIVE DIAGNOSIS:  Recurrent right shoulder anterior inferior dislocations.  POSTOPERATIVE DIAGNOSIS:  Recurrent right shoulder anterior inferior dislocations.  PROCEDURE:  Open right shoulder anterior reconstruction.  SURGEON:  Vania Rea. Supple, M.D.  ASSISTANTFrench Ana Shuford, P.A.-C.  ANESTHESIA:  General endotracheal as well as an interscalene block.  ESTIMATED BLOOD LOSS:  Minimal.  DRAINS:  None.  HISTORY:  Mr. Crite is a 65 year old gentleman who apparently sustained a right shoulder anterior inferior dislocation this past fall, and since that time he has had by his count over 20 dislocations, many of which required closed reduction under IV sedation in the emergency room.  His most recent episode occurred this past weekend, and he is now to the point where he is quite functionally limited and is requesting definitive surgical management.  Preoperatively I had counseled Mr. Arganbright on treatment options as well as risks versus benefits thereof.  Possible complications of bleeding, infection, neurovascular injury, persistent pain, loss of motion, recurrence of instability, and the possible need for further surgery are reviewed.  I also discussed that his preoperative x-rays showed evidence for early glenohumeral arthrosis and that he may have a stiff, painful shoulder secondary to the underlying osteoarthrosis.  He understands and accepts and agrees with our planned procedure.  DESCRIPTION OF PROCEDURE:  After undergoing routine preoperative evaluation, in the holding area the patient had interscalene  block placed by the anesthesia department.  He received prophylactic antibiotics.  Placed supine on the operating table and underwent smooth induction of general endotracheal anesthesia.  Placed in a beach chair position and appropriately padded and protected.  The right shoulder girdle region was sterilely prepped and draped in standard fashion.  The examination under anesthesia did confirm gross anterior inferior instability.  We outlined an anterior axillary incision from the coracoid process distally for a total length of 6 cm along the anterior axillary line.  Skin flaps were developed medially and laterally after we had infiltrated the skin with 0.5% Marcaine with epinephrine.  Electrocautery was used for hemostasis.  We developed the deltopectoral interval, retracting the cephalic vein laterally.  We placed a self-retaining retractor and then retracted the conjoined tendon medially.  We then divided the subscapularis 1 cm from its lesser tuberosity insertion and reflected the subscapularis medially and dissected it away from the underlying capsule.  The most inferior aspect of the subscapularis was quite attenuated.  Once the subscap was reflected, we then performed a capsulotomy, making a vertical split laterally and a horizontal split toward the glenoid.  Abundant synovial fluid was evacuated as well as multiple small cartilaginous fragments.  The glenohumeral joint itself was copiously irrigated and debrided.  There was evidence for early degenerative changes with a large Hill-Sachs lesion.  The anterior glenoid neck was completely denuded.  We introduced a curette and used this to abrade the anterior inferior aspect of the glenoid to enhance the healing process.  We then placed two 3.0 mm _____ bioabsorbable anchors with #2 Ethibond sutures.  These were placed at the 5 oclock and 3 oclock position. The suture anchors were placed on the face of the glenoid and not  anteriorly. These three limbs were then passed through the adjacent portion of the capsule, allowing a reconstruction of the anterior glenoid and capsule-labral attachment.  These were tied allowing excellent reapposition of the medial capsule to the anterior aspect of the glenoid in the glenoid rim.  The leaflets of the capsule were then shifted laterally, and we performed a capsular shift on the lateral attachment of the capsule, bringing the inferior leaflet superiorly and the superior leaflet inferiorly, and these were repaired with #1 Ethibond.  The rotator interval was also closed with #1 Ethibond.  We did repair the capsule with the arm in approximately 30 degrees of abduction and external rotation.  Good reapproximation of the tissue was achieved with excellent reduction on the capsular volume and good stability on examination under direct visualization.  We then repaired the subscapularis utilizing #1 Ethibond sutures.  The deltopectoral interval was then allowed to close.  Vicryl 2-0 was used for the subcu and 3-0 Monocryl was used for the skin.  Steri-Strips were applied.  Additional Marcaine with epinephrine was used along the incision.  The patient was then extubated and taken to the recovery room with the arm in a shoulder immobilizer in good condition. Dictated by:   Vania Rea. Supple, M.D. Attending Physician:  Cain Sieve DD:  06/26/01 TD:  06/28/01 Job: [phone] [id number]

## 2010-06-22 ENCOUNTER — Other Ambulatory Visit: Payer: Self-pay | Admitting: Internal Medicine

## 2010-06-22 NOTE — Telephone Encounter (Signed)
Pt wife called asking for refill of cheratussin. He has been coughing last 4 days and needs refill.  Pt # [id number]

## 2010-06-23 NOTE — Telephone Encounter (Signed)
Rx called in and pt notified.

## 2010-07-01 ENCOUNTER — Ambulatory Visit (INDEPENDENT_AMBULATORY_CARE_PROVIDER_SITE_OTHER): Payer: Medicare Other | Admitting: Gastroenterology

## 2010-07-01 ENCOUNTER — Other Ambulatory Visit: Payer: Self-pay | Admitting: Gastroenterology

## 2010-07-01 VITALS — BP 144/84 | HR 69 | Temp 96.0°F | Ht 68.0 in | Wt 234.0 lb

## 2010-07-01 DIAGNOSIS — B182 Chronic viral hepatitis C: Secondary | ICD-10-CM

## 2010-07-01 LAB — CBC WITH DIFFERENTIAL/PLATELET
Basophils Absolute: 0 10*3/uL (ref 0.0–0.1)
HCT: 45 % (ref 39.0–52.0)
Hemoglobin: 15.2 g/dL (ref 13.0–17.0)
Lymphocytes Relative: 23 % (ref 12–46)
Monocytes Absolute: 0.7 10*3/uL (ref 0.1–1.0)
Neutro Abs: 5.2 10*3/uL (ref 1.7–7.7)
Neutrophils Relative %: 66 % (ref 43–77)
RDW: 13.7 % (ref 11.5–15.5)
WBC: 7.9 10*3/uL (ref 4.0–10.5)

## 2010-07-01 LAB — PROTIME-INR
INR: 0.88 (ref <1.50)
Prothrombin Time: 12.1 seconds (ref 11.6–15.2)

## 2010-07-01 NOTE — Patient Instructions (Signed)
1) Increase the cholestyramine to 1 packet (4 grams) four times a day, before each meal and at night 2) Decrease the alprazolam to 0.5 mg in the morning (full pill) then 0.25 mg in the afternoon (half pill) for one month then after a month go to a half a pill (0.25 mg) twice a day. 3) I have given you reading material for hepatitis C 4) Return appt in 1 - 2 months.

## 2010-07-02 LAB — COMPREHENSIVE METABOLIC PANEL
ALT: 40 U/L (ref 0–53)
AST: 48 U/L — ABNORMAL HIGH (ref 0–37)
Alkaline Phosphatase: 234 U/L — ABNORMAL HIGH (ref 39–117)
CO2: 26 mEq/L (ref 19–32)
Creat: 0.95 mg/dL (ref 0.50–1.35)
Sodium: 141 mEq/L (ref 135–145)
Total Bilirubin: 1.9 mg/dL — ABNORMAL HIGH (ref 0.3–1.2)
Total Protein: 7.2 g/dL (ref 6.0–8.3)

## 2010-07-02 LAB — IGG 1, 2, 3, AND 4
IgG Subclass 1: 1100 mg/dL — ABNORMAL HIGH (ref 405.0–1011.0)
IgG Subclass 2: 309 mg/dL (ref 169.0–786.0)
IgG Subclass 3: 66.4 mg/dL (ref 11.0–85.0)

## 2010-07-02 LAB — IGM: IgM, Serum: 98 mg/dL (ref 41–251)

## 2010-07-02 LAB — BILIRUBIN, DIRECT: Bilirubin, Direct: 1.1 mg/dL — ABNORMAL HIGH (ref 0.0–0.3)

## 2010-07-02 LAB — IGG: IgG (Immunoglobin G), Serum: 1380 mg/dL (ref 650–1600)

## 2010-07-02 LAB — IGA: IgA: 219 mg/dL (ref 68–379)

## 2010-07-08 ENCOUNTER — Other Ambulatory Visit: Payer: Self-pay | Admitting: Gastroenterology

## 2010-07-08 DIAGNOSIS — B182 Chronic viral hepatitis C: Secondary | ICD-10-CM

## 2010-07-09 ENCOUNTER — Ambulatory Visit (HOSPITAL_COMMUNITY)
Admission: RE | Admit: 2010-07-09 | Discharge: 2010-07-09 | Disposition: A | Discharge status: Home / Self Care | Payer: Medicare Other | Source: Ambulatory Visit | Attending: Gastroenterology | Admitting: Gastroenterology

## 2010-07-09 DIAGNOSIS — R17 Unspecified jaundice: Secondary | ICD-10-CM | POA: Insufficient documentation

## 2010-07-09 DIAGNOSIS — B182 Chronic viral hepatitis C: Secondary | ICD-10-CM

## 2010-07-15 NOTE — Progress Notes (Signed)
NAME:  [name], Williamsen    MR#:  [id number]      DATE:  07/01/2010  DOB:  [birth year]    cc: Referring Physician:  Leslye Peer, MD, [facility], [address] [place], [place] [address], Fax [phone]    REASON FOR REFERRAL:  Genotype 1a HCV and pruritus.   History:  The patient is a 65 year old gentleman who I have been asked to see in consultation by Dr. Aldine Contes regarding a genotype 1a hepatitis C.   According to the records I received, his diagnosis of hepatitis C was made 02/19/2010, at which time his hepatitis B surface antigen was negative, but his total B core antibody was positive and his hepatitis B surface antibody was negative. Total hepatitis A antibody was positive, and his hepatitis C antibody was positive. HIV was negative. In terms of symptoms related to his hepatitis C from 02/23/2010 to  02/25/2010, the patient was hospitalized at [facility] for pruritus in addition to a urinary tract infection, and chronic obstructive pulmonary disease flare, and depression, and hypertension.  Notes indicate that during this hospitalization he was given hydroxyzine, Benadryl, diphenhydramine, Zyrtec, desonide, and mirtazapine for what was a 5 month history of itching at the time he  presented to the hospital. Although he is taking cholestyramine with slight improvement, he is under dosed on this. There are no other symptoms to suggest decompensated or cryoglobulin mediated disease.  With respect to risk factors for liver disease, he drank alcohol on a daily basis until he stopped drinking a year ago. There is a history of polysubstance abuse until 30 years ago. He has tattoos but no  history of unsterile body piercing or blood transfusion prior to 1992. There is no family history of liver disease. He reports he has not been vaccinated against hepatitis A or B.   PAST MEDICAL HISTORY:  Otherwise significant for hypertension and occupational hearing loss.  There is a history of chronic obstructive lung disease, hypertension, dyslipidemia.    PAST SURGICAL HISTORY:  Right arm and right knee surgery.    Past psychiatric history:  Anxiety and depression for which he has been treated by his prior physician with a benzodiazepine and an SSRI in the past. He patient's  wife indicated the could not tolerate the SSRI and is now Wellbutrin in addition to the benzodiazepine.   MEDICATIONS:  Albuterol inhaler, alprazolam 0.5 mg p.o. b.i.d., aspirin 81 mg p.o. daily, bupropion 150 mg p.o. daily, carvedilol 12.5 mg p.o. daily, Cheratussin AC 110 mg per 5 mL p.r.n., cholestyramine 4 g p.o. b.i.d.,  diphenhydramine 2% cream, fluticasone/salmeterol/naltrexone 70 mg p.o. daily but he reports he is not using this, omeprazole 40 mg p.o.  daily, pramoxine HCL 1% gel currently not using, tramadol 50 mg p.o. p.r.n. for pain, Spiriva HandiHaler.    Allergies:  Celecoxib causes an unknown reaction, sulfa medication causes pruritic rash.   habits:  Smoking 1 pack of cigarettes per day. Alcohol, denies, as above.   FAMILY HISTORY:  As above.   SOCIAL HISTORY:  He is a retired Designer, fashion/clothing, has 1 daughter and is accompanied by his wife today.   REVIEW OF SYSTEMS:  All 10 systems reviewed today with the patient and his wife. He reports significant shortness of breath with walking even short distances. For example is unable walk the length of the hallway in the  office today to get to the appointment without becoming short of breath. He also has difficulty with shortness of  breath walking, for example from his house to the end of the curb to collect his mail.   PHYSICAL EXAMINATION:   Constitutional:  Elderly appearing with some bitemporal wasting. Has a hearing aid in left ear. Vital signs: Height 68 inches, weight 234 pounds, blood pressure 144/84, pulse of 69, temperature is 96  Fahrenheit. Ears, nose, mouth and throat:  Unremarkable oropharynx.  No thyromegaly or neck  masses.  Chest:  Resonant to percussion.  Clear to auscultation.  Cardiovascular:  Heart sounds normal S1, S2 without  murmurs or rubs.  There is no peripheral edema.  Abdominal:  Normal bowel sounds.  No masses or tenderness.  I could not appreciate a liver edge or spleen tip.  I could not appreciate any hernias.   Lymphatics:  No cervical or inguinal lymphadenopathy.  Central Nervous System:  No asterixis or focal neurologic findings.  Dermatologic:  Anicteric without palmar erythema or spider angiomata.  Eyes:  Anicteric sclerae.  Pupils are equal and reactive to light.   Laboratories:  I reviewed the repeat lab work that was sent with him for the referral. This is several months old.  On 02/25/2010; creatinine was 1.03, albumin 3.3, globulins 3.7, AST 64, ALT 46, ALP 202, total bilirubin 2.7. This is not fractionated that I can see. His hepatitis C antibody was positive. On 02/24/2010,  his genotype was found to be genotype 1a.  Colonoscopy on 06/11/2009, showed chronic polyps, which were a mixture of hyperplastic polyps and tubular adenoma with tubular adenomas being in the cecum and proximal descending colon.   ASSESSMENT:  The patient is a 65 year old gentleman with history of genotype 1a, hepatitis C who is troubled by significant pruritus.  In terms of genotype 1a, hepatitis C, he presents today a poor candidate for treatment because of his significant chronic obstructive lung disease. There is a possibility that on therapy with interferon  his chronic obstructive pulmonary disease could worsen. Furthermore, shortness of breath can be exacerbated by both protease inhibitor and ribavirin induced anemia. Noninterferon based therapies are likely 2-3  years away at best.  These regimens may have their own side effects, which may also preclude consideration for treatment.  In addition to trying to treat his hepatitis C with a protease inhibitor, it is likely to provoke a pruritic drug rash,  which will only exacerbate his pruritus.  In terms of his pruritus, his lab testing from the hospital indicates synthetic dysfunction including hyperbilirubinemia, that could correlate with the pruritus. He has failed a number of different  agents although he and his wife cannot give a clear medical history today of some of the medications that are listed in Epic, but he thinks he is not taking naltrexone. I also note that he is  under dosing his cholestyramine. It would be worth increasing the cholestyramine to full dose before trying to initiate the naltrexone. I have discussed the possibility using sertraline for pruritus as it  has been reported in literature, but according to his wife he had a bad reaction to this, which is why he is taking the bupropion as well as the alprazolam. I also note the alprazolam can induce cholestasis  and it would be wise to try and taper that to considering how symptomatic he is from this arises.  As a last resort phenobarbital can be used but is sedating.  In my discussion today with the patient and his wife, we discussed these issues as mentioned above. I discussed the nature and natural history  of genotype 1 hepatitis C. I explained the usually we obtain a  biopsy, to see how significant liver disease is first before proceeding with treatment. However, I explained to him about my concerns about treating his hepatitis C.   PLAN:  1. Standard labs including direct bilirubin. 2. Will hold off on biopsy for now until we deal with his pruritus. 3. For the pruritus, I have told him to increase the cholestyramine to before meals and at bedtime, and I have given him a prescription for 128 packets or 4 g for 1 month supply with 11 refills. 4. He is to taper his alprazolam 0.5 mg a.m., 0.2 mg p.o. p.m. for 1 month, and then 0.25 mg b.i.d. for the subsequent months until he is seen again. 5. Literature on hepatitis C given. 6. I will obtain an ultrasound of the liver, as  well. 7. Hepatitis A immune.  Total Hep B core Ab positive. 8. He is to return approximately 2 month's time in follow up.            Brooke Dare, MD   ADDENDUM:   T bili 1.9, of which the direct was 1.1.  Ultrasound of 07/08/10, showed Common duct at the upper limits of normal with a suggestion of mild intrahepatic biliary ductal dilatation.  Will get an MRCP.   [id number] .[id number]  D:  Thu Jun 07 18:53:37 2012 ; T:  Sun Jun 10 09:07:39 2012  Job #:  [id number]

## 2010-08-14 ENCOUNTER — Encounter: Payer: Self-pay | Admitting: Internal Medicine

## 2010-08-23 ENCOUNTER — Encounter: Payer: Self-pay | Admitting: Internal Medicine

## 2010-08-23 ENCOUNTER — Ambulatory Visit (INDEPENDENT_AMBULATORY_CARE_PROVIDER_SITE_OTHER): Payer: Medicare Other | Admitting: Internal Medicine

## 2010-08-23 VITALS — BP 132/88 | HR 70 | Temp 97.0°F | Ht 68.0 in | Wt 230.5 lb

## 2010-08-23 DIAGNOSIS — F341 Dysthymic disorder: Secondary | ICD-10-CM

## 2010-08-23 DIAGNOSIS — I10 Essential (primary) hypertension: Secondary | ICD-10-CM

## 2010-08-23 DIAGNOSIS — J449 Chronic obstructive pulmonary disease, unspecified: Secondary | ICD-10-CM

## 2010-08-23 DIAGNOSIS — B1920 Unspecified viral hepatitis C without hepatic coma: Secondary | ICD-10-CM

## 2010-08-23 DIAGNOSIS — L299 Pruritus, unspecified: Secondary | ICD-10-CM

## 2010-08-23 LAB — HEPATITIS B SURFACE ANTIBODY,QUALITATIVE: Hep B S Ab: NEGATIVE

## 2010-08-23 LAB — HEPATITIS B SURFACE ANTIGEN: Hepatitis B Surface Ag: NEGATIVE

## 2010-08-23 NOTE — Patient Instructions (Signed)
Please follow up with Dr. Jacqualine Mau. We will make an appointment for you.

## 2010-08-23 NOTE — Assessment & Plan Note (Addendum)
Patient's refractory itchy symptoms have been going on for long time. He has tried several medications without significant improvement.  His elevated bilirubin is the most likely reason for his symptoms. The primary cause of his elevated bilirubin is likely due to hepatitis C. Differential could include biliary obstruction, such as primary biliary cirrhosis and primary sclerosing cholangitis. I would like to get Dr. Cynda Familia advice for further evaluation and treatment. Today, anti-mitochondria antibody test was ordered to rule out primary biliary cirrhosis.

## 2010-08-23 NOTE — Progress Notes (Signed)
  Subjective:    Patient ID: [name], male    DOB: 1945-05-13, 65 y.o.   MRN: [id number]  HPI Patient is 65 yo man with PMH of HCV, COPD, depression, HTN and GERD presents with chief complaint of itching all over the body. Patient was diagnosed with genotype 1a hepatitis C on Jan. 27, 2012. At the same time,  Patient was found have negative hepatitis B surface antigen, positive B core antibody and positive hepatitis A antibody. Dr. Jacqualine Mau saw patient on July 01, 2010 and considered patient as a poor candidate for the treatment because of his significant COPD which may worsen by interferon treatment. Dr. Jacqualine Mau suggests to treat patient symptomatically first. Patient's pruritus has been treated with several medications including cholestyramine,  hydroxyzine, Benadryl, diphenhydramine, Zyrtec and desonide. But his symptom has not been improved significantly.  Dr. Jacqualine Mau has increased the dose of cholestyramine which slightly improved his symptoms, but patient still feels very uncomfortable. He denies fever, chills, nausea, abdominal pain, diarrhea, color change in his urine or stool and rashes.     Review of Systems ROS: General: no fevers, chills, changes in weight, changes in appetite Skin: no rash, severe itchy. HEENT: no blurry vision, hearing changes, sore throat Pulm: no dyspnea, coughing, wheezing CV: no chest pain, palpitations, shortness of breath Abd: no abdominal pain, nausea/vomiting, diarrhea/constipation GU: no dysuria, hematuria, polyuria Ext: no arthralgias, myalgias Neuro: no weakness, numbness, or tingling         Objective:   Physical Exam General: alert, well-developed, and cooperative to examination.  Head: normocephalic and atraumatic.  Eyes: vision grossly intact, pupils equal, pupils round, pupils reactive to light, mildly icteric.  Mouth: pharynx pink and moist, no erythema, and no exudates.  Neck: supple, full ROM, no thyromegaly, no JVD, and no carotid  bruits.  Lungs: normal respiratory effort, no accessory muscle use, normal breath sounds, no crackles, and no wheezes. Heart: normal rate, regular rhythm, no murmur, no gallop, and no rub.  Abdomen: soft, non-tender, normal bowel sounds, no distention, no guarding, no rebound tenderness, no hepatomegaly, and no splenomegaly.  Msk: no joint swelling, no joint warmth, and no redness over joints.  Pulses: 2+ DP/PT pulses bilaterally Extremities: No cyanosis, clubbing, edema Neurologic: strength normal in all extremities, sensation intact to light touch, and gait normal.  Skin: dry, many scratching marks on legs and arms,  turgor normal and no rashes.  Psych: Oriented X3, memory intact for recent and remote, normally interactive, good eye contact, not anxious appearing, and not depressed appearing.          Assessment & Plan:

## 2010-08-23 NOTE — Assessment & Plan Note (Signed)
Patient's COPD is well controlled . No symptoms, will follow up.

## 2010-08-23 NOTE — Assessment & Plan Note (Addendum)
Patient was seen by Dr. Jacqualine Mau on July 01, 2010 and was told to go back in 1 or 2 months for a follow up. We made an appointment for him today. Since he was found to have negative HBV surface antigen and positive Core antibody before. We re-ordered HBV surface antigen and antibody tests to make sure whether he was in window period of HBV infection.

## 2010-08-23 NOTE — Assessment & Plan Note (Signed)
Patient's depression is well controlled, no new issue; will follow up.

## 2010-08-24 ENCOUNTER — Encounter: Payer: Self-pay | Admitting: Internal Medicine

## 2010-08-24 ENCOUNTER — Other Ambulatory Visit: Payer: Self-pay | Admitting: Gastroenterology

## 2010-08-24 DIAGNOSIS — B182 Chronic viral hepatitis C: Secondary | ICD-10-CM

## 2010-08-24 NOTE — Progress Notes (Signed)
Examined patient with Dr. Clyde Lundborg.  Very complex.  We agreed on a plan which Dr. Clyde Lundborg is implementing.

## 2010-08-24 NOTE — Assessment & Plan Note (Signed)
Patient's HTN is well controlled. His blood pressure is 132/88 mmHg today. Will continue current regimen and follow up.

## 2010-08-30 ENCOUNTER — Inpatient Hospital Stay (HOSPITAL_COMMUNITY)
Admission: RE | Admit: 2010-08-30 | Discharge: 2010-08-30 | Payer: Medicare Other | Source: Ambulatory Visit | Attending: Gastroenterology | Admitting: Gastroenterology

## 2010-09-01 ENCOUNTER — Other Ambulatory Visit: Payer: Self-pay | Admitting: Gastroenterology

## 2010-09-01 ENCOUNTER — Ambulatory Visit (HOSPITAL_COMMUNITY)
Admission: RE | Admit: 2010-09-01 | Discharge: 2010-09-01 | Disposition: A | Discharge status: Home / Self Care | Payer: Medicare Other | Source: Ambulatory Visit | Attending: Gastroenterology | Admitting: Gastroenterology

## 2010-09-01 DIAGNOSIS — B182 Chronic viral hepatitis C: Secondary | ICD-10-CM

## 2010-09-01 DIAGNOSIS — R599 Enlarged lymph nodes, unspecified: Secondary | ICD-10-CM | POA: Insufficient documentation

## 2010-09-01 DIAGNOSIS — L299 Pruritus, unspecified: Secondary | ICD-10-CM | POA: Insufficient documentation

## 2010-09-01 DIAGNOSIS — K828 Other specified diseases of gallbladder: Secondary | ICD-10-CM | POA: Insufficient documentation

## 2010-09-01 DIAGNOSIS — B1920 Unspecified viral hepatitis C without hepatic coma: Secondary | ICD-10-CM | POA: Insufficient documentation

## 2010-09-02 ENCOUNTER — Ambulatory Visit (INDEPENDENT_AMBULATORY_CARE_PROVIDER_SITE_OTHER): Payer: Medicare Other | Admitting: Gastroenterology

## 2010-09-02 VITALS — BP 132/86 | HR 64 | Temp 97.7°F | Ht 68.0 in | Wt 229.0 lb

## 2010-09-02 DIAGNOSIS — B182 Chronic viral hepatitis C: Secondary | ICD-10-CM

## 2010-09-02 NOTE — Patient Instructions (Signed)
1) For two weeks reduce the alprazolam (nerve pill) from 1 pill (0.5 mg) twice a day to 1 pill (0.5 mg) in the morning and half a pill (0.25 mg) in the evening.  Then for two more weeks go to half a pill twice a day. 2) Will start citalopram as a new antidepressant as we taper the alprazolam 3) Please ask at the [facility] for a pulmonary function testing and a stress test if we are to proceed with treating the hepatitis C 4) You can take the cholestyramine for the itching an hour after the other meds 5) Return in 4 - 6 weeks

## 2010-09-06 ENCOUNTER — Ambulatory Visit: Payer: Medicare Other | Admitting: Internal Medicine

## 2010-09-08 ENCOUNTER — Ambulatory Visit (INDEPENDENT_AMBULATORY_CARE_PROVIDER_SITE_OTHER): Payer: Medicare Other | Admitting: Internal Medicine

## 2010-09-08 ENCOUNTER — Encounter: Payer: Self-pay | Admitting: Internal Medicine

## 2010-09-08 VITALS — BP 113/75 | HR 67 | Temp 97.2°F | Ht 66.0 in | Wt 229.7 lb

## 2010-09-08 DIAGNOSIS — B1920 Unspecified viral hepatitis C without hepatic coma: Secondary | ICD-10-CM

## 2010-09-08 DIAGNOSIS — L299 Pruritus, unspecified: Secondary | ICD-10-CM

## 2010-09-08 DIAGNOSIS — J449 Chronic obstructive pulmonary disease, unspecified: Secondary | ICD-10-CM

## 2010-09-08 NOTE — Patient Instructions (Signed)
Please take your medicines as prescribed. Please schedule a follow up appointment with your PCP in 1 month.

## 2010-09-09 NOTE — Progress Notes (Signed)
NAME:  [name], [name]    MR#:  [id number]      DATE:  09/02/2010  DOB:  08/07/1945    cc: Primary Care Physician:  Lorretta Harp, MD Referring Physician:  Cherly Hensen, MD, [facility], [address] [place], [place] [address], Fax [phone]    REASON FOR VISIT:  Follow up of genotype 1a hepatitis C and cirrhosis.    History:  The patient returns today accompanied by his wife. Since last being seen on 07/01/2010, he has not tapered his alprazolam at all in an attempt to see if that would improve his cholestasis and pruritus. He  has however increased his cholestyramine to 3 times a day and at that times 4 times a day. He reports he does not think this has helped his pruritus, but his wife noticed that he is not scratching as much as  before. He underwent an MRI/MRCP without contrast on 08/24/2010, which did not show any evidence of biliary obstruction or dilation. Periods There is contraction of the gallbladder, but I doubt this represents  cholecystitis with an accompanying her Mirizzi syndrome.  Otherwise, there are no symptoms to suggest cryoglobulin mediated or decompensated liver disease.   Past MEDICAL HISTORY:  Significant for occupational hearing loss, hypertension. There is also history of chronic obstructive lung disease. As previously noted, this raises significant concern on my part of treating his hepatitis C.   CURRENT MEDICATIONS:  Aspirin 81 mg p.o. daily, omeprazole 40 mg p.o. daily, carvedilol 12.5 mg p.o. b.i.d., alprazolam 0.5 mg p.o. b.i.d., latanoprost 0.005% one drop to each eye at bedtime, Spiriva Handihaler, Advair 250/50,  Proventil inhaler, Benadryl 25-50 mg p.o. p.r.n. for pruritus, cholestyramine 4 grams p.o. b.i.d. to t.i.d.   ALLERGIES:  Celecoxib causes unknown reaction. Sulfa medications cause pruritus, sporadic rash.   HABITS:  Still smoking 1 pack of cigarettes per day. Alcohol, denies interval consumption.   REVIEW  OF SYSTEMS:  All 10 systems reviewed today with the patient and his wife and they are negative other which is mentioned above.   PHYSICAL EXAMINATION:   Constitutional:  Stated age without significant bitemporal wasting. Vital signs: Height 68 inches, weight 229 pounds, blood pressure 132/86, pulse 64, temperature 97.7 Fahrenheit.   Laboratories:  Labs done by me on 07/01/2010; his INR was 0.88, albumin was 4.0, creatinine was 0.95. Total bilirubin was 1.9, which the direct was 1.1. AST was 48, ALT 40, ALP 234. His immunoglobulins were  unremarkable. His IgG 4 subfraction was also unremarkable.  MRI/MRCP as mentioned above.  From 08/23/2010; drawn by his primary physician showed that his antimitochondrial antibody was negative. His hepatitis B surface  antibody was negative, and his hepatitis B surface antigen was negative.   Assessment:  The patient is a 65 year old male with history of genotype 1a hepatitis C without radiographic evidence to suggest cirrhosis but troubled by significant pruritus.  In terms of the pruritus, testing for radiographically obvious biliary disease such as significant primary sclerosing cholangitis was negative. A negative IgM and AMA suggests he does not have primarily  cirrhosis though this was suggested by his primary physician. I would doubt that hepatitis C is responsible for most of the cholestasis considering the fact that his synthetic function is otherwise intact,  and there is no evidence radiographically to suggest cirrhosis. This raised the possibility of drug-induced cholestasis. Although the ALP should not be that high, it is particularly notable that most of the  bilirubin is direct suggesting  that this could be a drug-induced cholestasis. Certainly if this was hepatitis C induced liver dysfunction one would expect a mixed hyperbilirubinemia. The  medication that of his lists of medications that would be most consistent with this would be alprazolam. It  should be noted that I give instructions to taper his alprazolam, last time, which he has  not done. When I discussed tapering this today both he and his wife indicated that there is concern that he would do poorly without something to treat his depression and anxiety. I have explained to him  that in addition to the fact that I think that this could be the cause of his pruritus, I also do not think that long-term benzodiazepines are enough to manage depression and anxiety. A selective serotonin  reuptake inhibitor should be considered. They indicated they have previous experience with sertraline and fluoxetine which were both very sedating. We discussed the possibly of needing citalopram  instead. Cymbalta is considered contraindicated in the setting of hepatitis C although in my personal experience it can be used if liver tests are monitored. In addition to the fact that by putting him on an  selective serotonin reuptake inhibitor this may actually help get him off the benzodiazepines, selective serotonin reuptake inhibitors can be used to manage symptoms of pruritus but this is typically  sertraline.  In terms of hepatitis C, I remain concerned that his chronic obstructive lung disease would be a significant contraindication to treating him. If I was to consider treating him. I would need  pulmonary function tests to demonstrate if he had a reasonable FEV1 perhaps greater than 1.5. In addition because of his age, I would like to have a stress test and have it demonstrate that he does not have  significant coronary artery disease, because triple therapy with a protease inhibitor would likely induce significant anemia, worsening shortness breath from his chronic obstructive lung disease, and  potentially provoking symptomatic coronary artery disease.  In my discussion today with the patient and his wife, we discussed the need to continue with the cholestyramine. We discussed tapering the alprazolam to  see if this is responsible for this cholestasis. I told,  I was aware that I could add rifampin to treat the cholestasis, I think it would be better to that if it was due to the alprazolam you should taper this, rather than adding another medication. We discussed  adding citalopram for his symptoms of depression and anxiety. We also discussed the parameters by which I would consider treating him for hepatitis C, although I am not certain that he would need these.   PLAN:  1. I have given her written instructions to reduce his alprazolam from 0.5 mg b.i.d. to 0.5 mg p.o. a.m. and at 0.25 mg p.o. a.m. p.m. for 2 week's and then reduce to 0.25 mg b.i.d. 2. I have added citalopram 10 mg p.o. daily for 7 days, to increase to 20 mg p.o. daily if he is tolerating this, with a 30 day supply with 5 refills. 3. I have asked him to discuss having pulmonary function tests done and a stress test to the internal medicine clinic if he is to be considered for treatment of his hepatitis C. 4. I have encouraged her to continue with the cholestyramine at t.i.d. q.i.d. potassium stat venous by an hour from his other medications. 5. He is to return. She is hepatitis A immune and total B core antibody positive, so I will take that as evidence of  immunity. 6. He is to return in 4-6 weeks' time. 7. He is to see his primary physician on 09/06/2010, at which time I hope they can implement some of the I suggested testing.            Brooke Dare, MD   [phone]  D:  Thu Aug 09 17:28:43 2012 ; TMorey Hummingbird Aug 09 19:44:44 2012  Job #:  [id number]

## 2010-09-09 NOTE — Progress Notes (Signed)
  Subjective:    Patient ID: [name], male    DOB: 03-26-1945, 65 y.o.   MRN: [id number]  HPI: 65 year old man with past medical history significant for hepatitis C, depression, COPD comes to the clinic for followup visit.   He complains of chronic itching,  has tried multiple treatments and now thinks cholestyramine is helping him a little finally .With his last appointment, Dr. Jacqualine Mau increased the dose of his cholestyramine from twice to 4 times a day which is helping him .  It seems like Xanax was also thought to be a possibility for his itching which is now getting tapered and the patient was recently started on Celexa. Patient has clear understanding of his new regimen. Patient also states that he has been  itching in his ears  - used a Q-tip yesterday but is worried that he could have  accidentaly injured his ears and was requesting for an evaluation. Denies any other complaints including chest pain, abdominal pain, nausea vomiting or diarrhea.    Review of Systems  Constitutional: Negative for fever, chills, appetite change and fatigue.  HENT: Positive for hearing loss. Negative for nosebleeds, congestion, rhinorrhea, sneezing and postnasal drip.   Eyes: Negative for photophobia and visual disturbance.  Respiratory: Negative for apnea, cough, choking, chest tightness, shortness of breath and stridor.   Cardiovascular: Negative for chest pain, palpitations and leg swelling.  Gastrointestinal: Negative for nausea, abdominal pain, diarrhea and constipation.  Genitourinary: Negative for dysuria, urgency, hematuria and flank pain.  Musculoskeletal: Negative for arthralgias.  Neurological: Negative for headaches.  Hematological: Negative for adenopathy.       Objective:   Physical Exam  Constitutional: He is oriented to person, place, and time. He appears well-developed and well-nourished. No distress.  HENT:  Head: Normocephalic and atraumatic.  Mouth/Throat: No oropharyngeal  exudate.       Trace amount of dried  blood was noticed in external ear canal for both the ears, TM intact bilaterally.  Eyes: Conjunctivae and EOM are normal. Pupils are equal, round, and reactive to light.  Neck: Normal range of motion. Neck supple.  Cardiovascular: Normal rate, regular rhythm, normal heart sounds and intact distal pulses.  Exam reveals no gallop and no friction rub.   No murmur heard. Pulmonary/Chest: Effort normal and breath sounds normal. No respiratory distress. He has no wheezes. He has no rales. He exhibits no tenderness.  Abdominal: Soft. Bowel sounds are normal. He exhibits no distension and no mass. There is no tenderness. There is no rebound and no guarding.  Musculoskeletal: Normal range of motion. He exhibits no edema and no tenderness.  Neurological: He is alert and oriented to person, place, and time. He has normal reflexes. He displays normal reflexes. No cranial nerve deficit. Coordination normal.  Skin: Skin is warm. He is not diaphoretic.          Assessment & Plan:

## 2010-09-09 NOTE — Assessment & Plan Note (Signed)
Appreciate Dr. Jacqualine Mau inputs! Patient states that the changed regimen of cholestyramine (2-4 times a day )is helping his itching a little better.  Xanax was also thought to be a possibility for his itching , so the patient is on tapering doses of xanax  and was started on Celexa for his depression. Continue current plan.

## 2010-09-10 ENCOUNTER — Telehealth: Payer: Self-pay | Admitting: Internal Medicine

## 2010-09-10 NOTE — Telephone Encounter (Signed)
Patient's spouse called stating that Mr. Holsinger c/o "burnining when he pees" for 7 hours. Denies urinary bladder retention or problems with a urinary stream; no hematuria, fever, chills, abdominal or back pain. Reports regular oral intake without any nausea or vomiting. Instructed to increase oral water intake, purchase OTC pyridium for symptom control and go to urgent care (given that [facility] is closed on weekends) in am for a complete assessment. Patient's wife verbalized understanding.

## 2010-09-10 NOTE — Assessment & Plan Note (Signed)
For the treatment of his Hepatitis genotype IaC, Dr. Timothy Lasso requested [place] to get PFTs  ( given his h/ o COPD)and stress test to rule out CAD ( given his age) because if we start the patient on triple therapy with protease inhibitor then he is at risk of developing anemia that worsen his SOB and potentially provoke CAD if he has. - Will refer him for PFT's and stress test.

## 2010-09-14 ENCOUNTER — Ambulatory Visit (HOSPITAL_COMMUNITY)
Admission: RE | Admit: 2010-09-14 | Discharge: 2010-09-14 | Disposition: A | Discharge status: Home / Self Care | Payer: Medicare Other | Source: Ambulatory Visit | Attending: Internal Medicine | Admitting: Internal Medicine

## 2010-09-14 DIAGNOSIS — J449 Chronic obstructive pulmonary disease, unspecified: Secondary | ICD-10-CM | POA: Insufficient documentation

## 2010-09-14 DIAGNOSIS — J4489 Other specified chronic obstructive pulmonary disease: Secondary | ICD-10-CM | POA: Insufficient documentation

## 2010-09-15 ENCOUNTER — Telehealth: Payer: Self-pay | Admitting: *Deleted

## 2010-09-15 NOTE — Telephone Encounter (Signed)
Call from pt's wife stating pt is having a problem voiding.  He has frequency, burning. She tried some OTC meds (pyridium twice a day for 3 days) which have helped some.  Pt scheduled for tomorrow.

## 2010-09-16 ENCOUNTER — Ambulatory Visit (INDEPENDENT_AMBULATORY_CARE_PROVIDER_SITE_OTHER): Payer: Medicare Other | Admitting: Internal Medicine

## 2010-09-16 VITALS — BP 124/74 | HR 65 | Temp 97.1°F | Ht 66.0 in | Wt 227.2 lb

## 2010-09-16 DIAGNOSIS — N390 Urinary tract infection, site not specified: Secondary | ICD-10-CM

## 2010-09-16 DIAGNOSIS — R300 Dysuria: Secondary | ICD-10-CM

## 2010-09-16 LAB — POCT URINALYSIS DIPSTICK
Glucose, UA: NEGATIVE
Nitrite, UA: POSITIVE
Spec Grav, UA: 1.025
Urobilinogen, UA: 0.2
pH, UA: 5

## 2010-09-16 MED ORDER — CIPROFLOXACIN HCL 250 MG PO TABS: 250.0000 mg | ORAL_TABLET | Freq: Two times a day (BID) | ORAL | Status: DC

## 2010-09-16 NOTE — Progress Notes (Signed)
  Subjective:    Patient ID: [name], male    DOB: 01/08/1946, 65 y.o.   MRN: [id number]  HPI:  65 year old man with past medical history significant for hepatitis C, HTN, COPD comes to the clinic for dysuria x 1 week.  His dysuria has been associated with urgency and frequency. He states that he has been using the bathroom every night after almost every hour. He has tried over-the-counter pyridium but is not helping him much. He also reports a fever of 102 on Sunday associated with chills when her daughter bought over the counter pyridium. Denies any abdominal pain, nausea, vomiting, hematuria, hesitancy.    Review of Systems  HENT: Negative for nosebleeds, congestion, rhinorrhea, sneezing and postnasal drip.   Eyes: Negative for visual disturbance.  Respiratory: Negative for apnea, cough, choking, chest tightness, shortness of breath and stridor.   Cardiovascular: Negative for chest pain, palpitations and leg swelling.  Gastrointestinal: Negative for abdominal pain, diarrhea and constipation.  Genitourinary: Positive for dysuria and frequency. Negative for hematuria.  Musculoskeletal: Negative for arthralgias.  Neurological: Negative for dizziness, facial asymmetry, light-headedness and headaches.  Hematological: Negative for adenopathy.  Psychiatric/Behavioral: Negative for agitation.       Objective:   Physical Exam  Constitutional: He is oriented to person, place, and time. He appears well-developed and well-nourished. No distress.  HENT:  Head: Normocephalic and atraumatic.  Mouth/Throat: No oropharyngeal exudate.  Eyes: Conjunctivae and EOM are normal. Pupils are equal, round, and reactive to light. Right eye exhibits no discharge. Left eye exhibits no discharge.  Neck: Normal range of motion. Neck supple. No JVD present. No tracheal deviation present. No thyromegaly present.  Cardiovascular: Normal rate, regular rhythm and normal heart sounds.  Exam reveals no gallop and  no friction rub.   No murmur heard. Pulmonary/Chest: Effort normal and breath sounds normal. No stridor. No respiratory distress. He has no wheezes. He has no rales. He exhibits no tenderness.  Abdominal: Soft. Bowel sounds are normal. He exhibits no distension and no mass. There is no tenderness. There is no rebound and no guarding.  Musculoskeletal: Normal range of motion. He exhibits no edema and no tenderness.  Lymphadenopathy:    He has no cervical adenopathy.  Neurological: He is alert and oriented to person, place, and time. He has normal reflexes. He displays normal reflexes. No cranial nerve deficit. Coordination normal.  Skin: Skin is warm. He is not diaphoretic.          Assessment & Plan:

## 2010-09-16 NOTE — Patient Instructions (Signed)
Please schedule a follow up appointment with your PCP in 1-2 months. Please take your medicines as prescribed. Please drink plenty of fluids.

## 2010-09-16 NOTE — Assessment & Plan Note (Signed)
Complains of dysuria frequency and urgency for one week. Had a fever of 102F on Sunday.  -We'll treat him empirically for complicated UTI( his gender) with ciprofloxacin for 10 days.  -Will get a UA and urine culture.  -I will call the patient with the results and his antibiotics needs to be changed. He verbalized understanding

## 2010-09-17 ENCOUNTER — Encounter: Payer: Self-pay | Admitting: Internal Medicine

## 2010-09-17 LAB — URINALYSIS, MICROSCOPIC ONLY
Casts: NONE SEEN
Crystals: NONE SEEN
Squamous Epithelial / LPF: NONE SEEN
WBC, UA: >50 WBC/hpf — AB (ref <3)

## 2010-09-17 LAB — URINALYSIS, ROUTINE W REFLEX MICROSCOPIC
Ketones, ur: NEGATIVE mg/dL
Nitrite: NEGATIVE
Specific Gravity, Urine: 1.015 (ref 1.005–1.030)
Urobilinogen, UA: 0.2 mg/dL (ref 0.0–1.0)
pH: 5 (ref 5.0–8.0)

## 2010-09-17 MED ORDER — CIPROFLOXACIN HCL 250 MG PO TABS: 500.0000 mg | ORAL_TABLET | Freq: Two times a day (BID) | ORAL | Status: DC

## 2010-09-17 NOTE — Progress Notes (Signed)
Spoke to patient's wife - asked her to schedule an appointment with the clinic for next week.  Increased the dose of cipro from 250 to 500 mg BID( for possible complicated UTI/ epididymitis) and informed her about the same. Called in the prescription at their pharmacy( [facility] [address])

## 2010-09-17 NOTE — Assessment & Plan Note (Signed)
He complains of dysuria and frequency for last one week. Denies any scrotal pain or swelling. Differentials include complicated UTI( male sex) versus acute epididymitis versus acute prostatitis.  -Urine dipstick showed positive nitrites and leukocytes. -We'll get a UA and urine culture. -Will treat him empirically with ciprofloxacin 500 mg twice a day for 10 days for complicated UTI. -Will reassess him in a week and would consider prolonging the duration of antibiotic therapy to 14 days. Would assess him for possible epididymitis and prostatitis if his symptoms persist.

## 2010-09-19 LAB — URINE CULTURE: Colony Count: >=100000

## 2010-09-24 ENCOUNTER — Other Ambulatory Visit (HOSPITAL_COMMUNITY): Payer: Medicare Other | Admitting: Radiology

## 2010-09-24 ENCOUNTER — Ambulatory Visit (INDEPENDENT_AMBULATORY_CARE_PROVIDER_SITE_OTHER): Payer: Medicare Other | Admitting: Internal Medicine

## 2010-09-24 ENCOUNTER — Other Ambulatory Visit: Payer: Self-pay | Admitting: Internal Medicine

## 2010-09-24 ENCOUNTER — Other Ambulatory Visit (HOSPITAL_COMMUNITY): Payer: Medicare Other

## 2010-09-24 VITALS — BP 153/78 | HR 62 | Temp 98.2°F | Wt 223.0 lb

## 2010-09-24 DIAGNOSIS — B1920 Unspecified viral hepatitis C without hepatic coma: Secondary | ICD-10-CM

## 2010-09-24 DIAGNOSIS — R300 Dysuria: Secondary | ICD-10-CM

## 2010-09-24 MED ORDER — CIPROFLOXACIN HCL 500 MG PO TABS: 500.0000 mg | ORAL_TABLET | Freq: Two times a day (BID) | ORAL | Status: AC

## 2010-09-24 NOTE — Assessment & Plan Note (Addendum)
He's concerned for some weight loss- reviewing the vitals, he has lost about 15 lbs gradually over a period of a year. Given his history of  hepatitis C- hepatocellular carcinoma is of concern but his alpha fetoprotein from 06/12 was negative and his last ultrasound from 06/12 was also negative for any liver mass. His last CXR from 01/12 was negative for any abnormal mass.  Last colonoscopy from 05/11 was reviewed that showed multiple polyps in the rectosigmoid region and the repeat colonoscopy is recommended in 3 years.He denies any blood in urine,  blood in stools or any other systemic symptoms.

## 2010-09-24 NOTE — Progress Notes (Signed)
  Subjective:    Patient ID: [name], male    DOB: Mar 25, 1945, 65 y.o.   MRN: [id number]  HPI: 65 year old man with past medical history significant for hepatitis C, COPD , history of recent UTI comes to the clinic for followup visit.  Patient was recently seen in the clinic about a week ago for culture positive UTI( E.coli) and was treated with ciprofloxacin for 10 days for complicated UTI . Patient comes in today for a followup on his dysuria.  He denies any urinary symptoms, hematuria, fever, chills, abdominal pain today. Also denies any scrotal swelling or discharge. He has been regularly taking his antibiotics.  He is somewhat concerned about his recent weight loss and loosening of clothes . He reports that he has good appetite but eating little less for 1 week because of the problem with his denture.    Review of Systems  Constitutional: Negative for fever and fatigue.  HENT: Negative for nosebleeds, congestion, rhinorrhea, sneezing, postnasal drip and tinnitus.   Eyes: Negative for visual disturbance.  Respiratory: Negative for apnea, cough, choking, chest tightness, shortness of breath, wheezing and stridor.   Cardiovascular: Negative for chest pain, palpitations and leg swelling.  Gastrointestinal: Negative for abdominal pain.  Genitourinary: Negative for dysuria and flank pain.  Musculoskeletal: Negative for arthralgias.  Neurological: Negative for light-headedness and headaches.  Hematological: Negative for adenopathy.       Objective:   Physical Exam  Constitutional: He is oriented to person, place, and time. He appears well-developed and well-nourished. No distress.  HENT:  Head: Normocephalic and atraumatic.  Mouth/Throat: No oropharyngeal exudate.  Eyes: Conjunctivae and EOM are normal. Pupils are equal, round, and reactive to light.  Neck: Normal range of motion. Neck supple. No JVD present. No tracheal deviation present. No thyromegaly present.  Cardiovascular:  Normal rate, regular rhythm, normal heart sounds and intact distal pulses.  Exam reveals no gallop and no friction rub.   No murmur heard. Pulmonary/Chest: Effort normal and breath sounds normal. No stridor. No respiratory distress. He has no wheezes. He has no rales. He exhibits no tenderness.  Abdominal: Soft. Bowel sounds are normal. He exhibits no distension and no mass. There is no tenderness. There is no rebound and no guarding.  Genitourinary:       No evidence of epididymitis, no lymphadenopathy.  Musculoskeletal: Normal range of motion. He exhibits no edema and no tenderness.  Lymphadenopathy:    He has no cervical adenopathy.  Neurological: He is alert and oriented to person, place, and time. He has normal reflexes. He displays normal reflexes. No cranial nerve deficit. He exhibits normal muscle tone. Coordination normal.  Skin: Skin is warm. He is not diaphoretic.          Assessment & Plan:

## 2010-09-24 NOTE — Patient Instructions (Addendum)
Please schedule a follow up appointment with your PCP in 1-2 months. Please take your medicines as prescribed. Please drink plenty of fluids and complete 14 day total course for your antibiotics. We will call you with the ultrasound appointment date and time.

## 2010-09-24 NOTE — Assessment & Plan Note (Signed)
Denies any complaint of dysuria, urgency or frequency. Reports feeling much better. No signs and symptoms of epididymitis on my exam. Will treat him for 4 more days of Cipro to complete 14 day therapy in total for  complicated UTI.

## 2010-09-30 ENCOUNTER — Ambulatory Visit (INDEPENDENT_AMBULATORY_CARE_PROVIDER_SITE_OTHER): Payer: Medicare Other | Admitting: Gastroenterology

## 2010-09-30 VITALS — BP 144/84 | HR 63 | Temp 97.3°F | Ht 68.0 in | Wt 220.0 lb

## 2010-09-30 DIAGNOSIS — B182 Chronic viral hepatitis C: Secondary | ICD-10-CM

## 2010-09-30 DIAGNOSIS — K7460 Unspecified cirrhosis of liver: Secondary | ICD-10-CM

## 2010-10-04 ENCOUNTER — Other Ambulatory Visit (HOSPITAL_COMMUNITY): Payer: Medicare Other

## 2010-10-05 ENCOUNTER — Ambulatory Visit (INDEPENDENT_AMBULATORY_CARE_PROVIDER_SITE_OTHER): Payer: Medicare Other | Admitting: Physician Assistant

## 2010-10-05 DIAGNOSIS — Z01810 Encounter for preprocedural cardiovascular examination: Secondary | ICD-10-CM

## 2010-10-05 DIAGNOSIS — J449 Chronic obstructive pulmonary disease, unspecified: Secondary | ICD-10-CM

## 2010-10-05 NOTE — Patient Instructions (Signed)
Your physician has requested that you have a dobutamine myoview DX PRECARDIOVASCULAR EXAM. For furth information please visit [email or web address] Please follow instruction sheet, as given.

## 2010-10-05 NOTE — Progress Notes (Signed)
Exercise Treadmill Test  Pre-Exercise Testing Evaluation Rhythm: normal sinus  Rate: 64   PR:  .18 QRS:  .09  QT:  .41 QTc: .43     Test  Exercise Tolerance Test Ordering MD: Dr. Lorretta Harp  Interpreting MD:  Tereso Newcomer PA-C  Unique Test No: 1  Treadmill:  1  Indication for ETT: known ASHD  Contraindication to ETT: No   Stress Modality: exercise - treadmill  Cardiac Imaging Performed: non   Protocol: standard Bruce - maximal  Max BP:  152/91  Max MPHR (bpm):  155 85% MPR (bpm):  131  MPHR obtained (bpm):  80 % MPHR obtained:    Reached 85% MPHR (min:sec):  n/a Total Exercise Time (min-sec):  4:14  Workload in METS:  3.5 Borg Scale: 15  Reason ETT Terminated:  patient's desire to stop    ST Segment Analysis At Rest: normal ST segments - no evidence of significant ST depression With Exercise: no evidence of significant ST depression  Other Information Arrhythmia:  No Angina during ETT:  absent (0) Quality of ETT:  non-diagnostic  ETT Interpretation:    Comments: Poor exercise tolerance. Submaximal exercise. 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 No chest pain. Normal BP response to exercise. Unable to achieve target heart rate.  Recommendations: Schedule Dobutamine Myoview.

## 2010-10-13 ENCOUNTER — Encounter: Payer: Self-pay | Admitting: *Deleted

## 2010-10-14 NOTE — Progress Notes (Signed)
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    HISTORY:  The patient returns today accompanied by his wife. He reports that since last seen on 09/02/2010 his pruritus is significantly improved. He attributes this to the more regular use of cholestyramine. In  addition, he has now completely tapered off his alprazolam as I suggested, although I am not completely convinced about what he is taking, as he did not bring his medications or a list with him today, and he and his wife cannot precisely name all his medications and their doses.   There are currently no symptoms to suggest decompensated liver disease. There are no symptoms to suggest cryoglobulin mediated disease. We have discussed possibly treating him for his hepatitis C,  notwithstanding the fact that he does not carry a list of his medications with him, which will be important in terms of treatment of his hepatitis C. I needed pulmonary function testing, which  apparently has been done, but I cannot access the results in EPIC. A stress echocardiogram is booked for 10/05/2010.    Past medical history:  There has been no other interval change.    MEDICATIONS:  He reports his medications are the same as before, which would be:  1. Aspirin 81 mg p.o. daily. 2. Omeprazole 40 mg p.o. daily. 3. Carvedilol 12.5 mg p.o. b.i.d.  4. Latanoprost 0.005% one drop to each eye at bedtime.  5. Spiriva HandiHaler. 6. Advair 250/50. 7. Proventil inhaler. 8. Benadryl 25-50 mg p.o. p.r.n. for pruritus. 9. Cholestyramine 4 grams p.o. t.i.d.  to q.i.d. 10. Citalopram 20 mg p.o. daily.  ALLERGIES:  1. Celecoxib causes an unknown reaction.  2. Sulfa medication causes pruritus and a rash.  HABITS:   Smoking:  Still smoking 1 pack of cigarettes per day.   Alcohol:  Denies interval consumption.   REVIEW OF SYSTEMS:  All 10 systems reviewed today with the patient and his wife and were negative other than that which is mentioned above. His CES-D was incomplete.   PHYSICAL EXAMINATION:   CONSTITUTIONAL:  Looks his stated age without significant bitemporal wasting.   Vital signs:  Height 68 inches.  Weight 220 pounds, which is down 9 pounds from previously. Blood pressure 144/84, pulse 63, temperature 97.3 degrees Fahrenheit.    Laboratory WORK:  Was reviewed within EPIC.     IMAGING STUDIES:  Most recent imaging studies as reviewed within EPIC on 08/06.  Ultrasound of 07/08/2010 did not show any evidence of liver masses.   MRI, MRCP of 09/01/2010 was done without contrast and, therefore, is of no screening value.   ASSESSMENT:  The patient is a 65 year old gentleman with a history of genotype 1A hepatitis C virus, IL28B unknown as I have not considered him for treatment at this time. There is radiographic imaging to suggest  Cirrhosis, so an IL28B would not dictate duration of therapy.  In terms of his hepatitis C treatment, I still need the results of his pulmonary function testing, because I cannot  retrieve this from EPIC. Also, his stress test is pending on 10/05/2010. Without these 2 tests,  I cannot make a decision as to the safety of treating him. Furthermore, as a 65 year old African American with cirrhosis, his response rate, even without doing the IL28B testing, may not be that  great.   In terms of his cirrhosis care, there is no indication that he needs to be screened for varices, as he has never bled, and he is on carvedilol, which could act as a prophylactic against variceal  bleeding. There is no ascites or  encephalopathy to treat. He is hepatitis A immune and total B core antibody positive, so I have taken that as a sign of immunity. In addition, his last hepatocellular  cancer screening was by ultrasound on 07/09/2010, and would not need to be repeated until December 2012. He should have received Pneumovax through his primary physician and is due for flu shots.  In terms of his pruritus, I suspect that most of this is because of the alprazolam, but some of this may have been due to his underlying liver disease. This issue is improved with cholestyramine and  discontinuation of the alprazolam.  In my discussion today with the patient and his wife, I explained that we need to have the results of the PFTs and stress test before I can consider treating him for hepatitis C. I have warned them, however,  that it is possible that treatment will be fraught with difficulty, and his chances of achieving SVR are significantly lower than those of the general population without advanced liver disease.   PLAN:  1. I will not repeat his labs yet to see the effects of discontinuation of the alprazolam on his bilirubin, but will do so at subsequent visits. 2. Ultrasound to be done in December 2012. 3. Hepatitis A and B immune. 4. There is no indication for a screening endoscopy while on carvedilol. 5. I would ask for copies of the PFTs and echocardiogram to be faxed to my office at [phone], as the PFTs are not available to me in EPIC, and I am not sure if the echocardiogram results will be also unavailable to me in Epic.   6. He is to return again in approximately 3-4 months' time to discuss results and repeat liver tests if I have not heard from his local physicians regarding his PFTs and echocardiogram by then.            Brooke Dare, MD   ADDENDUM: He had a nondiagnostic stress treadmill test.  A myoview was suggested which I very much agree with.  I have not received the PFT results.  403 .[id number]  D:   Thu Sep 06 18:58:47 2012 ; T:  Sat Sep 08 12:51:18 2012  Job #:  [id number]

## 2010-10-16 ENCOUNTER — Other Ambulatory Visit: Payer: Self-pay | Admitting: Internal Medicine

## 2010-10-28 ENCOUNTER — Ambulatory Visit (HOSPITAL_COMMUNITY): Payer: Medicare Other | Attending: Physician Assistant | Admitting: Radiology

## 2010-10-28 VITALS — Ht 68.0 in | Wt 219.0 lb

## 2010-10-28 DIAGNOSIS — R9431 Abnormal electrocardiogram [ECG] [EKG]: Secondary | ICD-10-CM

## 2010-10-28 DIAGNOSIS — R0602 Shortness of breath: Secondary | ICD-10-CM

## 2010-10-28 DIAGNOSIS — Z01810 Encounter for preprocedural cardiovascular examination: Secondary | ICD-10-CM

## 2010-10-28 MED ORDER — TECHNETIUM TC 99M TETROFOSMIN IV KIT
33.0000 | PACK | Freq: Once | INTRAVENOUS | Status: AC | PRN
  Administered 2010-10-28: 33 via INTRAVENOUS

## 2010-10-28 MED ORDER — TECHNETIUM TC 99M TETROFOSMIN IV KIT
11.0000 | PACK | Freq: Once | INTRAVENOUS | Status: AC | PRN
  Administered 2010-10-28: 11 via INTRAVENOUS

## 2010-10-28 MED ORDER — REGADENOSON 0.4 MG/5ML IV SOLN
0.4000 mg | Freq: Once | INTRAVENOUS | Status: AC
  Administered 2010-10-28: 0.4 mg via INTRAVENOUS

## 2010-10-28 NOTE — Progress Notes (Signed)
[facility] SITE 3 NUCLEAR MED [address] [place] [address] [phone]  Cardiology Nuclear Med Study  Ariq Khamis is a 65 y.o. male [id number] 09/15/1945   Nuclear Med Background Indication for Stress Test:  Evaluation for Ischemia and Clearance for Treatment of Hepatitis C History:  Abnormal EKG, COPD, '07 Echo: EF: 60-75% NL, 10/05/10 GXT: Unable to get HR up and 09/07 Myocardial Perfusion Study: EF: 52% mild distal ant-apical ischemia Cardiac Risk Factors: Hypertension, Lipids and Smoker  Symptoms:  DOE, Fatigue and SOB   Nuclear Pre-Procedure Caffeine/Decaff Intake:  None NPO After: 9:00pm   Lungs:  clear IV 0.9% NS with Angio Cath:  20g  IV Site: R Antecubital  IV Started by:  Irean Hong, RN  Chest Size (in):  48 Cup Size: n/a  Height: 5\' 8"  (1.727 m)  Weight:  219 lb (99.338 kg)  BMI:  Body mass index is 33.30 kg/(m^2). Tech Comments:  n/a    Nuclear Med Study 1 or 2 day study: 1 day  Stress Test Type:  Lexiscan  Reading MD: Olga Millers, MD  Order Authorizing Provider:  Tereso Newcomer, PAC/ Dr. Peter [place], MD  Resting Radionuclide: Technetium 70m Tetrofosmin  Resting Radionuclide Dose: 11.0 mCi   Stress Radionuclide:  Technetium 44m Tetrofosmin  Stress Radionuclide Dose: 33.0 mCi           Stress Protocol Rest HR: 54 Stress HR: 74  Rest BP: 131/68 Stress BP: 137/59  Exercise Time (min): n/a METS: n/a   Predicted Max HR: 155 bpm % Max HR: 47.74 bpm Rate Pressure Product: 21308   Dose of Adenosine (mg):  n/a Dose of Lexiscan: 0.4 mg  Dose of Atropine (mg): n/a Dose of Dobutamine: n/a mcg/kg/min (at max HR)  Stress Test Technologist: Milana Na, EMT-P  Nuclear Technologist:  Domenic Polite, CNMT     Rest Procedure:  Myocardial perfusion imaging was performed at rest 45 minutes following the intravenous administration of Technetium 101m Tetrofosmin. Rest ECG: Sinus Bradycardia  Stress Procedure:  The patient received IV  Lexiscan 0.4 mg over 15-seconds.  Technetium 66m Tetrofosmin injected at 30-seconds.  There were no significant changes, + sob with a sudden rush with Lexiscan.  Quantitative spect images were obtained after a 45 minute delay. Stress ECG: No significant ST segment change suggestive of ischemia.  QPS Raw Data Images:  Acquisition technically good; normal left ventricular size. Stress Images:  There is decreased uptake in the apex. Rest Images:  There is decreased uptake in the apex, slightly less prominent compared to the stress images. Subtraction (SDS):  These findings are consistent with apical thinning; cannot R/O minimal apical ischemia. Transient Ischemic Dilatation (Normal <1.22):  1.06 Lung/Heart Ratio (Normal <0.45):  0.30  Quantitative Gated Spect Images QGS EDV:  116 ml QGS ESV:  43 ml QGS cine images:  Normal Wall Motion QGS EF: 63%  Impression Exercise Capacity:  Lexiscan with no exercise. BP Response:  Normal blood pressure response. Clinical Symptoms:  No chest pain. ECG Impression:  No significant ST segment change suggestive of ischemia. Comparison with Prior Nuclear Study: No significant change from previous study  Overall Impression:  Low risk stress nuclear study with small apical defect most likely secondary to apical thinning; cannot R/O minimal apical ischemia.      Olga Millers

## 2010-11-01 ENCOUNTER — Telehealth: Payer: Self-pay | Admitting: *Deleted

## 2010-11-01 NOTE — Telephone Encounter (Signed)
Message copied by Lorayne Bender on Mon Nov 01, 2010 11:57 AM ------      Message from: [place], PETER M      Created: Sat Oct 30, 2010  3:40 PM       Apical thinning without significant ischemia. Normal EF. Unchanged from prior study.      Theron Arista [place]

## 2010-11-01 NOTE — Telephone Encounter (Signed)
Notified of stress test results. Will send copy to Dr. Clyde Lundborg

## 2010-11-04 ENCOUNTER — Telehealth: Payer: Self-pay | Admitting: *Deleted

## 2010-11-04 NOTE — Telephone Encounter (Signed)
Notified of myoview stress test results. Will send copy to Dr.Zacks and Dr.Niu

## 2010-11-04 NOTE — Telephone Encounter (Signed)
Message copied by Lorayne Bender on Thu Nov 04, 2010  3:02 PM ------      Message from: [place], [place] T      Created: Tue Nov 02, 2010  8:39 AM       Low risk scan      Please send to Dr. Jacqualine Mau with Hep C clinic      Tereso Newcomer, PA-C

## 2010-11-05 ENCOUNTER — Other Ambulatory Visit: Payer: Self-pay | Admitting: Internal Medicine

## 2010-11-07 ENCOUNTER — Other Ambulatory Visit: Payer: Self-pay | Admitting: Internal Medicine

## 2010-11-07 MED ORDER — HYDROCODONE-ACETAMINOPHEN 5-500 MG PO TABS: 1.0000 | ORAL_TABLET | Freq: Four times a day (QID) | ORAL | Status: DC | PRN

## 2010-11-08 NOTE — Telephone Encounter (Signed)
Called to pharm 

## 2010-11-15 NOTE — Telephone Encounter (Signed)
Called to pharmacy 11/07/10.

## 2010-11-19 ENCOUNTER — Ambulatory Visit (INDEPENDENT_AMBULATORY_CARE_PROVIDER_SITE_OTHER): Payer: Medicare Other | Admitting: *Deleted

## 2010-11-19 DIAGNOSIS — Z23 Encounter for immunization: Secondary | ICD-10-CM

## 2010-12-30 ENCOUNTER — Ambulatory Visit: Payer: Medicare Other | Admitting: Gastroenterology

## 2011-02-01 ENCOUNTER — Ambulatory Visit (INDEPENDENT_AMBULATORY_CARE_PROVIDER_SITE_OTHER): Payer: Medicare Other | Admitting: Internal Medicine

## 2011-02-01 ENCOUNTER — Encounter: Payer: Self-pay | Admitting: Internal Medicine

## 2011-02-01 VITALS — BP 171/90 | HR 65 | Temp 97.2°F | Ht 68.0 in | Wt 233.4 lb

## 2011-02-01 DIAGNOSIS — J449 Chronic obstructive pulmonary disease, unspecified: Secondary | ICD-10-CM

## 2011-02-01 DIAGNOSIS — M1990 Unspecified osteoarthritis, unspecified site: Secondary | ICD-10-CM

## 2011-02-01 DIAGNOSIS — I10 Essential (primary) hypertension: Secondary | ICD-10-CM

## 2011-02-01 DIAGNOSIS — M25569 Pain in unspecified knee: Secondary | ICD-10-CM

## 2011-02-01 DIAGNOSIS — M25561 Pain in right knee: Secondary | ICD-10-CM

## 2011-02-01 MED ORDER — TIOTROPIUM BROMIDE MONOHYDRATE 18 MCG IN CAPS: 18.0000 ug | ORAL_CAPSULE | Freq: Every day | RESPIRATORY_TRACT | Status: DC

## 2011-02-01 MED ORDER — TRAMADOL HCL 50 MG PO TABS: 50.0000 mg | ORAL_TABLET | Freq: Three times a day (TID) | ORAL | Status: DC | PRN

## 2011-02-01 MED ORDER — CARVEDILOL 12.5 MG PO TABS: 25.0000 mg | ORAL_TABLET | Freq: Two times a day (BID) | ORAL | Status: DC

## 2011-02-01 MED ORDER — GUAIFENESIN-CODEINE 100-10 MG/5ML PO SYRP: 5.0000 mL | ORAL_SOLUTION | Freq: Three times a day (TID) | ORAL | Status: DC | PRN

## 2011-02-01 NOTE — Patient Instructions (Signed)
1. Please take all medications as prescribed.  2. If you have worsening of your symptoms or new symptoms arise, please call the clinic ([phone]), or go to the ER immediately if symptoms are severe. 

## 2011-02-01 NOTE — Assessment & Plan Note (Signed)
A 

## 2011-02-01 NOTE — Progress Notes (Signed)
Patient ID: [name], male   DOB: 1945/10/05, 66 y.o.   MRN: [id number]  Subjective:   Patient ID: [name] male   DOB: 10-Jan-1946 66 y.o.   MRN: [id number]  HPI: Mr.[name] is a 66 y.o.    Patient is 66 yo man with PMH of HCV, COPD, depression, HTN and GERD presents for a regular follow up visit and wants to get some refill of his medications. He reports that his itching problem is getting better. Dr. Essie Christine has been following his HCV. The last visit was on 09/30/10. Patient is currently on his cholestyromin which largely relieves his itching. Patient also reports having right knee pain which is his chronic issue. Today his blood pressure is high 9171/90 mmHg. His COPD has been stable, no new issues.   Denies fever, chills, fatigue, headaches,  chest pain, SOB,  abdominal pain,diarrhea, constipation, dysuria, urgency, frequency, hematuria, joint pain or leg swelling.    Past Medical History  Diagnosis Date  . Hepatitis C 2012     a hepatitis panel done February 19, 1998 showed anti-HBc +, anti-HBV +, hep C ab reactive, February 23, 2010 hepatitis B DNA not detected, HCV RNA  VL = 444000  . Abnormal liver function tests 01/2010     on hospital admission February 23, 2010 AP = 36, AST = 62, ALT = 40, GGT = 392  . COPD (chronic obstructive pulmonary disease)   . Hypertension   . Hyperlipidemia   . GERD (gastroesophageal reflux disease)   . Anxiety   . Depression   . Pruritus 06/2009     this is most likely secondary to cholestasis, last bilirubin 2.7 (02-25-2010)   Current Outpatient Prescriptions  Medication Sig Dispense Refill  . albuterol (VENTOLIN HFA) 108 (90 BASE) MCG/ACT inhaler Inhale 1-2 puffs into the lungs every 4 (four) hours as needed. For shortness of breath or wheezing.       Marland Kitchen aspirin 81 MG tablet Take 81 mg by mouth daily.        . carvedilol (COREG) 12.5 MG tablet Take 2 tablets (25 mg total) by mouth 2 (two) times daily with a meal.  60 tablet  6  .  cholestyramine (QUESTRAN) 4 G packet Use 1 Packet as directed twice daily  60 each  11  . citalopram (CELEXA) 10 MG tablet Take 10 mg by mouth daily. Take 1 tab by mouth daily for 1 week and then take 2 tabs by mouth daily.       . Fluticasone-Salmeterol (ADVAIR DISKUS) 250-50 MCG/DOSE AEPB Inhale 1 puff into the lungs 2 (two) times daily.        Marland Kitchen guaiFENesin-codeine (CHERATUSSIN AC) 100-10 MG/5ML syrup Take 5 mLs by mouth 3 (three) times daily as needed for cough.  120 mL  3  . omeprazole (PRILOSEC) 40 MG capsule Take 1 capsule (40 mg total) by mouth daily.  30 capsule  3  . tiotropium (SPIRIVA) 18 MCG inhalation capsule Place 1 capsule (18 mcg total) into inhaler and inhale daily.  30 capsule  6  . traMADol (ULTRAM) 50 MG tablet Take 1 tablet (50 mg total) by mouth 3 (three) times daily as needed. For pain.  90 tablet  2  . DISCONTD: carvedilol (COREG) 12.5 MG tablet TAKE 1 TABLET BY MOUTH TWICE DAILY  60 tablet  6  . DISCONTD: CHERATUSSIN AC 100-10 MG/5ML syrup TAKE 1 TEASPOONFUL BY MOUTH EVERY 4 HOURS AS NEEDED FOR COUGH  120 mL  2  .  DISCONTD: tiotropium (SPIRIVA) 18 MCG inhalation capsule Place 18 mcg into inhaler and inhale daily.        Marland Kitchen ALPRAZolam (XANAX) 0.5 MG tablet Take 3 tablets at night time.  90 tablet  3  . diphenhydrAMINE (BENADRYL MAXIMUM STRENGTH) 2 % cream Apply topically. Use as instructed.      Marland Kitchen HYDROcodone-acetaminophen (VICODIN) 5-500 MG per tablet Take 1 tablet by mouth every 6 (six) hours as needed for pain.  120 tablet  0  . naltrexone (DEPADE) 50 MG tablet Take 1 tablet (50 mg total) by mouth daily.  30 tablet  1  . Pramoxine HCl 1 % GEL Apply 1 Tube topically 3 (three) times daily.  1 Bottle  1   Family History  Problem Relation Age of Onset  . Stroke Neg Hx   . Cancer Neg Hx    History   Social History  . Marital Status: Legally Separated    Spouse Name: N/A    Number of Children: N/A  . Years of Education: N/A   Social History Main Topics  . Smoking  status: Current Everyday Smoker -- 1.0 packs/day for 49 years    Types: Cigarettes  . Smokeless tobacco: None  . Alcohol Use: No  . Drug Use: No  . Sexually Active: None   Other Topics Concern  . None   Social History Narrative  . None   Review of Systems: Constitutional: Denies fever, chills, diaphoresis, appetite change and fatigue.  HEENT: Denies photophobia, eye pain, redness, hearing loss, ear pain, congestion, sore throat, rhinorrhea, sneezing, mouth sores, trouble swallowing, neck pain, neck stiffness and tinnitus.   Respiratory: Denies SOB, DOE, cough, chest tightness,  and wheezing.   Cardiovascular: Denies chest pain, palpitations and leg swelling.  Gastrointestinal: Denies nausea, vomiting, abdominal pain, diarrhea, constipation, blood in stool and abdominal distention.  Genitourinary: Denies dysuria, urgency, frequency, hematuria, flank pain and difficulty urinating.  Musculoskeletal: Denies myalgias, back pain, right knee has mild welling and pain over medial side.  Skin: Denies pallor, rash and wound. mild itchy all over the body which is much better than before.  Neurological: Denies dizziness, seizures, syncope, weakness, light-headedness, numbness and headaches.  Hematological: Denies adenopathy. Easy bruising, personal or family bleeding history  Psychiatric/Behavioral: Denies suicidal ideation, mood changes, confusion, nervousness, sleep disturbance and agitation  Objective:  Physical Exam: Filed Vitals:   02/01/11 1347  BP: 171/90  Pulse: 65  Temp: 97.2 F (36.2 C)  TempSrc: Oral  Height: 5\' 8"  (1.727 m)  Weight: 233 lb 6.4 oz (105.87 kg)  SpO2: 96%   Constitutional: Vital signs reviewed.  Patient is a well-developed and well-nourished.  in no acute distress and cooperative with exam. Alert and oriented x3.  Head: Normocephalic and atraumatic Ear: TM normal bilaterally Mouth: no erythema or exudates, MMM Eyes: PERRL, EOMI, conjunctivae normal, No scleral  icterus.  Neck: Supple, Trachea midline normal ROM, No JVD, mass, thyromegaly, or carotid bruit present.  Cardiovascular: RRR, S1 normal, S2 normal, no MRG, pulses symmetric and intact bilaterally Pulmonary/Chest: CTAB, no wheezes, rales, or rhonchi Abdominal: Soft. Non-tender, non-distended, bowel sounds are normal, no masses, organomegaly, or guarding present.  GU: no CVA tenderness Musculoskeletal: right knee is mildly swelling and tender on the medial side. No redness or warmth. ROM full  Hematology: no cervical, inginal, or axillary adenopathy.  Neurological: A&O x3, Strenght is normal and symmetric bilaterally, cranial nerve II-XII are grossly intact, no focal motor deficit, sensory intact to light touch bilaterally.  Skin:  Warm, dry and intact. No rash, cyanosis, or clubbing.  mild itchy. Psychiatric: Normal mood and affect. speech and behavior is normal. Judgment and thought content normal. Cognition and memory are normal.   Assessment & Plan:   #Knee pain: most likely due to osteoarthtitis. His X-ray of knee joint on 2009 showed Degenerative change without fracture and with Small effusion. Will continue with symtomatic management with tramadol. If getting worse, will repeat image in next visit.  # HTN: His bp is 171/90 mmHg. He has been on carvedilol 12.5 mg Bid. Will increase his carvedilol to 25 mg BID and follow up.  # Pruritus: this is most likely secondary to cholestasis, last bilirubin 2.7 (02-25-2010). This problem is well controlled now. Will continue with cholestyramine.   #. COPD: stable. Will continue currently regimen. Patient still smokes. I did conselling again and he would like to consider quitting smoking.   # HCV: he has been followed up by Dr. Brooke Dare.   Lorretta Harp

## 2011-02-02 NOTE — Progress Notes (Signed)
I agree with Dr. Niu's assessment and plan. 

## 2011-03-07 ENCOUNTER — Other Ambulatory Visit: Payer: Self-pay | Admitting: Internal Medicine

## 2011-03-24 ENCOUNTER — Other Ambulatory Visit: Payer: Self-pay | Admitting: Internal Medicine

## 2011-03-28 ENCOUNTER — Other Ambulatory Visit: Payer: Self-pay | Admitting: *Deleted

## 2011-04-20 ENCOUNTER — Encounter: Payer: Self-pay | Admitting: Internal Medicine

## 2011-04-20 ENCOUNTER — Ambulatory Visit (INDEPENDENT_AMBULATORY_CARE_PROVIDER_SITE_OTHER): Payer: Medicare Other | Admitting: Internal Medicine

## 2011-04-20 VITALS — BP 152/92 | HR 67 | Temp 98.0°F | Wt 236.8 lb

## 2011-04-20 DIAGNOSIS — J449 Chronic obstructive pulmonary disease, unspecified: Secondary | ICD-10-CM

## 2011-04-20 DIAGNOSIS — B1920 Unspecified viral hepatitis C without hepatic coma: Secondary | ICD-10-CM

## 2011-04-20 DIAGNOSIS — K759 Inflammatory liver disease, unspecified: Secondary | ICD-10-CM

## 2011-04-20 DIAGNOSIS — F341 Dysthymic disorder: Secondary | ICD-10-CM

## 2011-04-20 DIAGNOSIS — M25561 Pain in right knee: Secondary | ICD-10-CM

## 2011-04-20 DIAGNOSIS — R7401 Elevation of levels of liver transaminase levels: Secondary | ICD-10-CM

## 2011-04-20 DIAGNOSIS — I10 Essential (primary) hypertension: Secondary | ICD-10-CM

## 2011-04-20 DIAGNOSIS — J4489 Other specified chronic obstructive pulmonary disease: Secondary | ICD-10-CM

## 2011-04-20 DIAGNOSIS — L299 Pruritus, unspecified: Secondary | ICD-10-CM

## 2011-04-20 DIAGNOSIS — M25569 Pain in unspecified knee: Secondary | ICD-10-CM

## 2011-04-20 DIAGNOSIS — H9190 Unspecified hearing loss, unspecified ear: Secondary | ICD-10-CM | POA: Insufficient documentation

## 2011-04-20 LAB — COMPLETE METABOLIC PANEL WITH GFR
AST: 38 U/L — ABNORMAL HIGH (ref 0–37)
Albumin: 3.9 g/dL (ref 3.5–5.2)
Alkaline Phosphatase: 161 U/L — ABNORMAL HIGH (ref 39–117)
Calcium: 8.8 mg/dL (ref 8.4–10.5)
Chloride: 111 mEq/L (ref 96–112)
Potassium: 4 mEq/L (ref 3.5–5.3)
Sodium: 138 mEq/L (ref 135–145)
Total Protein: 7.1 g/dL (ref 6.0–8.3)

## 2011-04-20 MED ORDER — LISINOPRIL 5 MG PO TABS: 5.0000 mg | ORAL_TABLET | Freq: Every day | ORAL | Status: DC

## 2011-04-20 MED ORDER — CHOLESTYRAMINE 4 G PO PACK: PACK | ORAL | Status: DC

## 2011-04-20 NOTE — Assessment & Plan Note (Signed)
Patient has been wearing hearing aids for years and would like to see a new ENT physician for new hearing aids. -Will refer to ENT

## 2011-04-20 NOTE — Assessment & Plan Note (Signed)
He reports being depressed because his sister just passed away last month.  He denies any SI/HI/hallucinations. -Will continue Celexa 10mg  qd

## 2011-04-20 NOTE — Assessment & Plan Note (Signed)
Stable, lung exam did not show any wheezing or crackles or increased in work of breathing. -Will continue Advair, Spiriva, and albuterol inhalers

## 2011-04-20 NOTE — Assessment & Plan Note (Signed)
Not well-controlled.  On arrival BP was 160/90 .  Repeat BP was 152/92. -Will add Lisinopril 5mg  po qd -Continue Coreg 25mg  bid -repeat BP check in 4 wks

## 2011-04-20 NOTE — Assessment & Plan Note (Addendum)
2/2 liver disease.  He did have elevated direct bilirubin in the past.  Patient reports taking Questran 1-3 times daily and not 4 times as he was instructed by Dr. Jacqualine Mau.  He said that when he does take it consistently, the pruritis does improve. He has tried Benadryl, Vistaril, Naltroxone, without any relief per patient's report.  Although, I am not sure how accurate his report is because it appears that he does not really understand what medications he is supposed to take. He brought all the medications in today and I did not see Xanax in his bag.  I updated the med list with all the meds that he is actually taking. -I instructed patient that he has to take it consistently, max dose of Questran is 16g per day.   -Will check CMP, direct bili today -He does have a follow up appointment with Dr. Jacqualine Mau in August

## 2011-04-20 NOTE — Assessment & Plan Note (Addendum)
Osteoarthritis.  Exam showed crepitus and limited ROM with mild effusion of the right knee joint.  Xray of right Knee in 2009 did show DJD and effusion.  Patient would like for more aggressive intervention because he tried Vicodin and Tramadol which did not really work and had GI side effect.   -Will refer to orthopedics

## 2011-04-20 NOTE — Patient Instructions (Signed)
Start taking Lisinopril 5mg  one tablet daily You need to take Questran (Cholestyramine) 4g four times daily as instructed by Dr. Jacqualine Mau to help with the itching.   Will get lab works today and I will call you with any acute abnormal results Will refer to ENT for hearing aid Will refer to orthopedics for your knee pain Make sure you follow up with Dr. Jacqualine Mau  Follow up with your primary care physician in 4-6 weeks

## 2011-04-20 NOTE — Progress Notes (Signed)
HPI:[name] is a 66 yo man with PMH of hep C, COPD, HTN, pruritis, right knee pain presents today for c/o  still itching and he has been instructed to take Questran 4g QID but he has been taking anywhere from once to 3 times per day depending if he is at home or not, but sometimes he runs out. He reports that benadryl, Vistaril, and Naltrexone did not work for him.  When he does take the Questran consistently, it does help his pruritis.  He states that he has an appointment with Dr. Jacqualine Mau in August. Right knee pain x 8-9 years because of arthritis.  He fell off the roof many years ago.  Would like to get his right knee fixed for better quality of life.  ROS: as per HPI  PE: General: alert, well-developed, and cooperative to examination.  Lungs: normal respiratory effort, no accessory muscle use, normal breath sounds, no crackles, and no wheezes. Heart: normal rate, regular rhythm, no murmur, no gallop, and no rub.  Abdomen: soft, non-tender, normal bowel sounds, no distention, no guarding, no rebound tenderness Extremities: Right knee: mild effusion of the right knee, ++crepitus on ROM but limited.  No tenderness to palpation, no increased in warmth, or erythema of the right knee.  Left knee: wnl Neurologic: alert & oriented X3, cranial nerves II-XII intact, strength normal in all extremities, sensation intact to light touch, and gait normal.  Skin: + excoriation marks all over body.

## 2011-05-18 NOTE — Progress Notes (Signed)
Addended by: Neomia Dear on: 05/18/2011 03:53 PM   Modules accepted: Orders

## 2011-07-06 ENCOUNTER — Other Ambulatory Visit (HOSPITAL_COMMUNITY): Payer: Self-pay | Admitting: Orthopaedic Surgery

## 2011-07-21 ENCOUNTER — Encounter (HOSPITAL_COMMUNITY): Payer: Self-pay | Admitting: Pharmacy Technician

## 2011-07-25 ENCOUNTER — Encounter (HOSPITAL_COMMUNITY)
Admission: RE | Admit: 2011-07-25 | Discharge: 2011-07-25 | Disposition: A | Discharge status: Home / Self Care | Payer: Medicare Other | Source: Ambulatory Visit | Attending: Orthopaedic Surgery | Admitting: Orthopaedic Surgery

## 2011-07-25 ENCOUNTER — Encounter (HOSPITAL_COMMUNITY): Payer: Self-pay

## 2011-07-25 ENCOUNTER — Ambulatory Visit (HOSPITAL_COMMUNITY)
Admission: RE | Admit: 2011-07-25 | Discharge: 2011-07-25 | Disposition: A | Discharge status: Home / Self Care | Payer: Medicare Other | Source: Ambulatory Visit | Attending: Anesthesiology | Admitting: Anesthesiology

## 2011-07-25 ENCOUNTER — Encounter: Payer: Self-pay | Admitting: Internal Medicine

## 2011-07-25 ENCOUNTER — Ambulatory Visit (INDEPENDENT_AMBULATORY_CARE_PROVIDER_SITE_OTHER): Payer: Medicare Other | Admitting: Internal Medicine

## 2011-07-25 VITALS — BP 144/82 | HR 64 | Temp 96.5°F | Ht 66.0 in | Wt 234.3 lb

## 2011-07-25 DIAGNOSIS — I10 Essential (primary) hypertension: Secondary | ICD-10-CM

## 2011-07-25 DIAGNOSIS — J449 Chronic obstructive pulmonary disease, unspecified: Secondary | ICD-10-CM

## 2011-07-25 DIAGNOSIS — F17200 Nicotine dependence, unspecified, uncomplicated: Secondary | ICD-10-CM | POA: Insufficient documentation

## 2011-07-25 DIAGNOSIS — M1710 Unilateral primary osteoarthritis, unspecified knee: Secondary | ICD-10-CM | POA: Insufficient documentation

## 2011-07-25 DIAGNOSIS — Z01810 Encounter for preprocedural cardiovascular examination: Secondary | ICD-10-CM | POA: Insufficient documentation

## 2011-07-25 DIAGNOSIS — Z01818 Encounter for other preprocedural examination: Secondary | ICD-10-CM | POA: Insufficient documentation

## 2011-07-25 DIAGNOSIS — M25569 Pain in unspecified knee: Secondary | ICD-10-CM

## 2011-07-25 DIAGNOSIS — B1920 Unspecified viral hepatitis C without hepatic coma: Secondary | ICD-10-CM

## 2011-07-25 DIAGNOSIS — J4489 Other specified chronic obstructive pulmonary disease: Secondary | ICD-10-CM

## 2011-07-25 DIAGNOSIS — Z01812 Encounter for preprocedural laboratory examination: Secondary | ICD-10-CM | POA: Insufficient documentation

## 2011-07-25 HISTORY — DX: Shortness of breath: R06.02

## 2011-07-25 LAB — URINE MICROSCOPIC-ADD ON

## 2011-07-25 LAB — URINALYSIS, ROUTINE W REFLEX MICROSCOPIC
Hgb urine dipstick: NEGATIVE
Nitrite: NEGATIVE
Protein, ur: 30 mg/dL — AB
Specific Gravity, Urine: 1.023 (ref 1.005–1.030)
Urobilinogen, UA: 0.2 mg/dL (ref 0.0–1.0)

## 2011-07-25 LAB — COMPREHENSIVE METABOLIC PANEL
ALT: 24 U/L (ref 0–53)
BUN: 15 mg/dL (ref 6–23)
CO2: 20 mEq/L (ref 19–32)
Calcium: 8.9 mg/dL (ref 8.4–10.5)
Creatinine, Ser: 0.96 mg/dL (ref 0.50–1.35)
GFR calc Af Amer: >90 mL/min (ref >90)
GFR calc non Af Amer: 84 mL/min — ABNORMAL LOW (ref >90)
Glucose, Bld: 97 mg/dL (ref 70–99)
Sodium: 139 mEq/L (ref 135–145)
Total Protein: 6.9 g/dL (ref 6.0–8.3)

## 2011-07-25 LAB — SURGICAL PCR SCREEN
MRSA, PCR: NEGATIVE
Staphylococcus aureus: NEGATIVE

## 2011-07-25 LAB — CBC
HCT: 44.4 % (ref 39.0–52.0)
MCHC: 34.5 g/dL (ref 30.0–36.0)
Platelets: 204 10*3/uL (ref 150–400)
RDW: 13.6 % (ref 11.5–15.5)
WBC: 8.7 10*3/uL (ref 4.0–10.5)

## 2011-07-25 LAB — TYPE AND SCREEN
ABO/RH(D): O POS
Antibody Screen: NEGATIVE

## 2011-07-25 LAB — APTT: aPTT: 31 seconds (ref 24–37)

## 2011-07-25 MED ORDER — ALBUTEROL SULFATE HFA 108 (90 BASE) MCG/ACT IN AERS: 2.0000 | INHALATION_SPRAY | RESPIRATORY_TRACT | Status: DC | PRN

## 2011-07-25 MED ORDER — FLUTICASONE-SALMETEROL 250-50 MCG/DOSE IN AEPB: 1.0000 | INHALATION_SPRAY | Freq: Two times a day (BID) | RESPIRATORY_TRACT | Status: DC

## 2011-07-25 MED ORDER — TIOTROPIUM BROMIDE MONOHYDRATE 18 MCG IN CAPS: 18.0000 ug | ORAL_CAPSULE | Freq: Every day | RESPIRATORY_TRACT | Status: DC

## 2011-07-25 NOTE — Progress Notes (Signed)
Dr. Eliberto Ivory office called, pt. Has penicillin allergy and has Ancef ordered pre-op.  New order received .

## 2011-07-25 NOTE — Patient Instructions (Addendum)
1. Please take all medications as prescribed.  2. please consider cutting down on his smoking. It will help your breathing greatly.  3. If you have worsening of your symptoms or new symptoms arise, please call the clinic ([phone]), or go to the ER immediately if symptoms are severe.

## 2011-07-25 NOTE — Progress Notes (Signed)
Patient ID: [name], male   DOB: [birth year], 66 y.o.   MRN: [id number]  Subjective:   Patient ID: [name] male   DOB: 12-13-1945 66 y.o.   MRN: [id number]  HPI: [name] is a 66 y.o. with a past medical history as outlined below, who presents with a followup visit.  Patient's reports that he has been taking his medication regularly. He does not have a new complaints. He said he is going to have knee surgery in next Tuesday. he still have mild right knee pain and swelling. Regarding his COPD, he has been using his inhalers when necessary. He does not have shortness today. He is still smoking and does not want to quit. Regarding his hepatitis C, he has been followed up with the Dr. Timothy Hancock. He's going to see Dr. Timothy Hancock in August. He said that his chronic itchy issues improved recently.   Past Medical History  Diagnosis Date  . Abnormal liver function tests 01/2010     on hospital admission February 23, 2010 AP = 36, AST = 62, ALT = 40, GGT = 392  . COPD (chronic obstructive pulmonary disease)   . Hypertension   . Hyperlipidemia   . GERD (gastroesophageal reflux disease)   . Anxiety   . Depression   . Pruritus 06/2009     this is most likely secondary to cholestasis, last bilirubin 2.7 (02-25-2010)  . Hepatitis C 2012     a hepatitis panel done February 19, 1998 showed anti-HBc +, anti-HBV +, hep C ab reactive, February 23, 2010 hepatitis B DNA not detected, HCV RNA  VL = 444000  . Shortness of breath     with exereize   Current Outpatient Prescriptions  Medication Sig Dispense Refill  . albuterol (VENTOLIN HFA) 108 (90 BASE) MCG/ACT inhaler Inhale 2 puffs into the lungs every 4 (four) hours as needed. For shortness of breath or wheezing.      Marland Kitchen aspirin EC 81 MG tablet Take 81 mg by mouth daily.      . citalopram (CELEXA) 20 MG tablet Take 20 mg by mouth daily.      . Fluticasone-Salmeterol (ADVAIR DISKUS) 250-50 MCG/DOSE AEPB Inhale 1 puff into the lungs 2 (two) times daily.         Marland Kitchen latanoprost (XALATAN) 0.005 % ophthalmic solution Place 1 drop into both eyes at bedtime.      Marland Kitchen lisinopril (PRINIVIL,ZESTRIL) 5 MG tablet Take 5 mg by mouth daily.      Marland Kitchen tiotropium (SPIRIVA) 18 MCG inhalation capsule Place 18 mcg into inhaler and inhale daily.      . cholestyramine (QUESTRAN) 4 G packet Take 1 packet by mouth 3 (three) times daily with meals.      Marland Kitchen HYDROcodone-acetaminophen (VICODIN) 5-500 MG per tablet Take 1 tablet by mouth every 6 (six) hours as needed. For pain.      Marland Kitchen omeprazole (PRILOSEC) 40 MG capsule Take 40 mg by mouth daily.      . traMADol (ULTRAM) 50 MG tablet Take 50 mg by mouth every 8 (eight) hours as needed. For pain.       Family History  Problem Relation Age of Onset  . Stroke Neg Hx   . Cancer Neg Hx    History   Social History  . Marital Status: Legally Separated    Spouse Name: N/A    Number of Children: N/A  . Years of Education: N/A   Social History Main Topics  . Smoking status: Current  Everyday Smoker -- 1.5 packs/day for 49 years    Types: Cigarettes  . Smokeless tobacco: None  . Alcohol Use: No  . Drug Use: No  . Sexually Active: None   Other Topics Concern  . None   Social History Narrative  . None   Review of Systems:  General: no fevers, chills, no changes in body weight, no changes in appetite Skin: no rash HEENT: no blurry vision, hearing changes or sore throat Pulm: no dyspnea, coughing, wheezing CV: no chest pain, palpitations, shortness of breath Abd: no nausea/vomiting, abdominal pain, diarrhea/constipation GU: no dysuria, hematuria, polyuria Ext: Right knee pain and swelling.  Neuro: no weakness, numbness, or tingling   Objective:  Physical Exam: Filed Vitals:   07/25/11 1335  BP: 144/82  Pulse: 64  Temp: 96.5 F (35.8 C)  TempSrc: Oral  Height: 5\' 6"  (1.676 m)  Weight: 234 lb 4.8 oz (106.278 kg)   General: resting in bed, not in acute distress HEENT: PERRL, EOMI, no scleral icterus Cardiac:  S1/S2, RRR, No murmurs, gallops or rubs Pulm: Good air movement bilaterally, Clear to auscultation bilaterally, No rales, wheezing, rhonchi or rubs. Abd: Soft,  nondistended, nontender, no rebound pain, no organomegaly, BS present Ext: No rashes. 2+DP/PT pulse bilaterally. There is tenderness and swelling over his right knee. There is some effusion in the right knee joints. There is no warmth or redness over right knee joints. Musculoskeletal: No joint deformities, erythema, or stiffness, ROM full and no nontender Skin: no rashes. No skin bruise. Neuro: alert and oriented X3, cranial nerves II-XII grossly intact, muscle strength 5/5 in all extremeties,  sensation to light touch intact.  Psych.: patient is not psychotic, no suicidal or hemocidal ideation.    Assessment & Plan:   Right knee pain: Patient has chronic degenerative joint in his right knee. Currently there is mild tenderness and effusion in the right knee joint. He is scheduled to have total knee arthroplasty surgery in next week. His EKG shows T. wave inversion in V2 to V5 and also in inferior leads, which are better than previous EKG 2009. His chest x-ray shows peribronchial thickening indicating acute or chronic bronchitis, which is most likely caused by his long time smoking. His oxygen saturation is 97% on room air. He does not have shortness of breath, chest pain or palpitation recently. His 2-D echo on 2007 showed normal wall motion and EF 60-70%. Taking together, patient has low risk for his next week's surgery.  Hypertension: His blood pressure is slightly elevated at 144/82 today. Given that he is going to do surgery next week, will not adjust his blood pressure medication today. COPD: He has been using his inhalers, prn. He does not have shortness of breath today. His oxygen saturation is 97% on room air. Will continue the same regimen. Hepatitis C: He has been followed up with the Dr. Timothy Hancock. He's going to see Dr. Timothy Hancock in  August.  Lorretta Harp, MD PGY1, Internal Medicine Teaching Service Pager: [phone]

## 2011-07-25 NOTE — Pre-Procedure Instructions (Signed)
20 Sota Hetz  07/25/2011   Your procedure is scheduled on:  08-02-2011  Report to [facility] at 10:45AM.  Call this number if you have problems the morning of surgery: [phone]   Remember:   Do not eat food:After Midnight.      Take these medicines the morning of surgery with A SIP OF WATER:Inhaleers as needed and instructed by MD,pain medication as needed,omeprazole(Prilosec),citalopram(celexa)     Do not wear jewelry, make-up or nail polish.  Do not wear lotions, powders, or perfumes. You may wear deodorant.  Do not shave 48 hours prior to surgery. Men may shave face and neck.  Do not bring valuables to the hospital.  Contacts, dentures or bridgework may not be worn into surgery.  Leave suitcase in the car. After surgery it may be brought to your room.  For patients admitted to the hospital, checkout time is 11:00 AM the day of discharge.   Patients discharged the day of surgery will not be allowed to drive home.   Special Instructions: CHG Shower Use Special Wash: 1/2 bottle night before surgery and 1/2 bottle morning of surgery.   Please read over the following fact sheets that you were given: Pain Booklet, Coughing and Deep Breathing, Blood Transfusion Information, MRSA Information and Surgical Site Infection Prevention

## 2011-08-01 MED ORDER — CLINDAMYCIN PHOSPHATE 600 MG/50ML IV SOLN
600.0000 mg | Freq: Once | INTRAVENOUS | Status: AC
  Administered 2011-08-02: 600 mg via INTRAVENOUS
  Filled 2011-08-01: qty 50

## 2011-08-02 ENCOUNTER — Encounter (HOSPITAL_COMMUNITY)
Admission: RE | Disposition: A | Discharge status: Home Health | Payer: Self-pay | Source: Ambulatory Visit | Attending: Orthopaedic Surgery

## 2011-08-02 ENCOUNTER — Inpatient Hospital Stay (HOSPITAL_COMMUNITY): Payer: Medicare Other

## 2011-08-02 ENCOUNTER — Inpatient Hospital Stay (HOSPITAL_COMMUNITY)
Admission: RE | Admit: 2011-08-02 | Discharge: 2011-08-05 | DRG: 470 | Disposition: A | Discharge status: Home Health | Payer: Medicare Other | Source: Ambulatory Visit | Attending: Orthopaedic Surgery | Admitting: Orthopaedic Surgery

## 2011-08-02 ENCOUNTER — Encounter (HOSPITAL_COMMUNITY): Payer: Self-pay | Admitting: *Deleted

## 2011-08-02 ENCOUNTER — Ambulatory Visit (HOSPITAL_COMMUNITY): Payer: Medicare Other

## 2011-08-02 ENCOUNTER — Encounter (HOSPITAL_COMMUNITY): Payer: Self-pay

## 2011-08-02 DIAGNOSIS — M1711 Unilateral primary osteoarthritis, right knee: Secondary | ICD-10-CM

## 2011-08-02 DIAGNOSIS — B1920 Unspecified viral hepatitis C without hepatic coma: Secondary | ICD-10-CM | POA: Diagnosis present

## 2011-08-02 DIAGNOSIS — Z79899 Other long term (current) drug therapy: Secondary | ICD-10-CM

## 2011-08-02 DIAGNOSIS — Z880 Allergy status to penicillin: Secondary | ICD-10-CM

## 2011-08-02 DIAGNOSIS — F3289 Other specified depressive episodes: Secondary | ICD-10-CM | POA: Diagnosis present

## 2011-08-02 DIAGNOSIS — F329 Major depressive disorder, single episode, unspecified: Secondary | ICD-10-CM | POA: Diagnosis present

## 2011-08-02 DIAGNOSIS — Z7982 Long term (current) use of aspirin: Secondary | ICD-10-CM

## 2011-08-02 DIAGNOSIS — Z882 Allergy status to sulfonamides status: Secondary | ICD-10-CM

## 2011-08-02 DIAGNOSIS — K219 Gastro-esophageal reflux disease without esophagitis: Secondary | ICD-10-CM | POA: Diagnosis present

## 2011-08-02 DIAGNOSIS — J4489 Other specified chronic obstructive pulmonary disease: Secondary | ICD-10-CM | POA: Diagnosis present

## 2011-08-02 DIAGNOSIS — J449 Chronic obstructive pulmonary disease, unspecified: Secondary | ICD-10-CM

## 2011-08-02 DIAGNOSIS — E785 Hyperlipidemia, unspecified: Secondary | ICD-10-CM | POA: Diagnosis present

## 2011-08-02 DIAGNOSIS — Z888 Allergy status to other drugs, medicaments and biological substances status: Secondary | ICD-10-CM

## 2011-08-02 DIAGNOSIS — F411 Generalized anxiety disorder: Secondary | ICD-10-CM | POA: Diagnosis present

## 2011-08-02 DIAGNOSIS — M1710 Unilateral primary osteoarthritis, unspecified knee: Principal | ICD-10-CM | POA: Diagnosis present

## 2011-08-02 DIAGNOSIS — I10 Essential (primary) hypertension: Secondary | ICD-10-CM | POA: Diagnosis present

## 2011-08-02 HISTORY — PX: KNEE ARTHROPLASTY: SHX992

## 2011-08-02 SURGERY — ARTHROPLASTY, KNEE, TOTAL, USING IMAGELESS COMPUTER-ASSISTED NAVIGATION
Anesthesia: Choice | Site: Knee | Laterality: Right | Wound class: Clean

## 2011-08-02 MED ORDER — PROPOFOL 10 MG/ML IV EMUL
INTRAVENOUS | Status: DC | PRN
  Administered 2011-08-02: 110 mg via INTRAVENOUS

## 2011-08-02 MED ORDER — SODIUM CHLORIDE 0.9 % IV SOLN
INTRAVENOUS | Status: DC
  Administered 2011-08-03: 1000 mL via INTRAVENOUS

## 2011-08-02 MED ORDER — CEFAZOLIN SODIUM-DEXTROSE 2-3 GM-% IV SOLR
INTRAVENOUS | Status: AC
  Filled 2011-08-02: qty 50

## 2011-08-02 MED ORDER — PANTOPRAZOLE SODIUM 40 MG PO TBEC
40.0000 mg | DELAYED_RELEASE_TABLET | Freq: Every day | ORAL | Status: DC
  Administered 2011-08-03 – 2011-08-05 (×3): 40 mg via ORAL
  Filled 2011-08-02 (×3): qty 1

## 2011-08-02 MED ORDER — METOCLOPRAMIDE HCL 10 MG PO TABS: 5.0000 mg | ORAL_TABLET | Freq: Three times a day (TID) | ORAL | Status: DC | PRN

## 2011-08-02 MED ORDER — ONDANSETRON HCL 4 MG/2ML IJ SOLN: 4.0000 mg | Freq: Once | INTRAMUSCULAR | Status: DC | PRN

## 2011-08-02 MED ORDER — CITALOPRAM HYDROBROMIDE 20 MG PO TABS
20.0000 mg | ORAL_TABLET | Freq: Every day | ORAL | Status: DC
  Administered 2011-08-02 – 2011-08-05 (×4): 20 mg via ORAL
  Filled 2011-08-02 (×4): qty 1

## 2011-08-02 MED ORDER — ASPIRIN EC 325 MG PO TBEC
325.0000 mg | DELAYED_RELEASE_TABLET | Freq: Two times a day (BID) | ORAL | Status: DC
  Administered 2011-08-03 – 2011-08-05 (×5): 325 mg via ORAL
  Filled 2011-08-02 (×7): qty 1

## 2011-08-02 MED ORDER — METOCLOPRAMIDE HCL 5 MG/ML IJ SOLN: 5.0000 mg | Freq: Three times a day (TID) | INTRAMUSCULAR | Status: DC | PRN

## 2011-08-02 MED ORDER — MORPHINE SULFATE 2 MG/ML IJ SOLN
2.0000 mg | INTRAMUSCULAR | Status: DC | PRN
  Administered 2011-08-02 – 2011-08-04 (×5): 2 mg via INTRAVENOUS
  Filled 2011-08-02 (×4): qty 1

## 2011-08-02 MED ORDER — KETOROLAC TROMETHAMINE 15 MG/ML IJ SOLN
7.5000 mg | Freq: Four times a day (QID) | INTRAMUSCULAR | Status: AC
  Administered 2011-08-02 – 2011-08-03 (×3): 7.5 mg via INTRAVENOUS
  Filled 2011-08-02 (×4): qty 1

## 2011-08-02 MED ORDER — LACTATED RINGERS IV SOLN
INTRAVENOUS | Status: DC | PRN
  Administered 2011-08-02 (×2): via INTRAVENOUS

## 2011-08-02 MED ORDER — CEFAZOLIN SODIUM 1-5 GM-% IV SOLN
INTRAVENOUS | Status: DC | PRN
  Administered 2011-08-02: 2 g via INTRAVENOUS

## 2011-08-02 MED ORDER — MENTHOL 3 MG MT LOZG: 1.0000 | LOZENGE | OROMUCOSAL | Status: DC | PRN

## 2011-08-02 MED ORDER — FLUTICASONE-SALMETEROL 250-50 MCG/DOSE IN AEPB
1.0000 | INHALATION_SPRAY | Freq: Two times a day (BID) | RESPIRATORY_TRACT | Status: DC
  Administered 2011-08-02 – 2011-08-05 (×5): 1 via RESPIRATORY_TRACT
  Filled 2011-08-02: qty 14

## 2011-08-02 MED ORDER — CEFAZOLIN SODIUM 1-5 GM-% IV SOLN
1.0000 g | Freq: Four times a day (QID) | INTRAVENOUS | Status: AC
  Administered 2011-08-02 – 2011-08-03 (×2): 1 g via INTRAVENOUS
  Filled 2011-08-02 (×2): qty 50

## 2011-08-02 MED ORDER — ONDANSETRON HCL 4 MG/2ML IJ SOLN
INTRAMUSCULAR | Status: DC | PRN
  Administered 2011-08-02: 4 mg via INTRAVENOUS

## 2011-08-02 MED ORDER — LACTATED RINGERS IV SOLN
INTRAVENOUS | Status: DC
  Administered 2011-08-02: 12:00:00 via INTRAVENOUS

## 2011-08-02 MED ORDER — LISINOPRIL 5 MG PO TABS
5.0000 mg | ORAL_TABLET | Freq: Every day | ORAL | Status: DC
  Administered 2011-08-02 – 2011-08-05 (×4): 5 mg via ORAL
  Filled 2011-08-02 (×4): qty 1

## 2011-08-02 MED ORDER — ONDANSETRON HCL 4 MG PO TABS: 4.0000 mg | ORAL_TABLET | Freq: Four times a day (QID) | ORAL | Status: DC | PRN

## 2011-08-02 MED ORDER — ALUM & MAG HYDROXIDE-SIMETH 200-200-20 MG/5ML PO SUSP
30.0000 mL | ORAL | Status: DC | PRN
  Administered 2011-08-03: 30 mL via ORAL
  Filled 2011-08-02: qty 30

## 2011-08-02 MED ORDER — ONDANSETRON HCL 4 MG/2ML IJ SOLN: 4.0000 mg | Freq: Four times a day (QID) | INTRAMUSCULAR | Status: DC | PRN

## 2011-08-02 MED ORDER — OXYCODONE HCL 5 MG PO TABS
5.0000 mg | ORAL_TABLET | ORAL | Status: DC | PRN
  Administered 2011-08-02 – 2011-08-05 (×10): 10 mg via ORAL
  Filled 2011-08-02 (×10): qty 2

## 2011-08-02 MED ORDER — FENTANYL CITRATE 0.05 MG/ML IJ SOLN
INTRAMUSCULAR | Status: DC | PRN
  Administered 2011-08-02: 25 ug via INTRAVENOUS
  Administered 2011-08-02: 125 ug via INTRAVENOUS
  Administered 2011-08-02: 50 ug via INTRAVENOUS

## 2011-08-02 MED ORDER — ACETAMINOPHEN 650 MG RE SUPP: 650.0000 mg | Freq: Four times a day (QID) | RECTAL | Status: DC | PRN

## 2011-08-02 MED ORDER — ALBUTEROL SULFATE HFA 108 (90 BASE) MCG/ACT IN AERS
2.0000 | INHALATION_SPRAY | RESPIRATORY_TRACT | Status: DC | PRN
  Filled 2011-08-02: qty 6.7

## 2011-08-02 MED ORDER — EPHEDRINE SULFATE 50 MG/ML IJ SOLN
INTRAMUSCULAR | Status: DC | PRN
  Administered 2011-08-02: 10 mg via INTRAVENOUS
  Administered 2011-08-02: 15 mg via INTRAVENOUS

## 2011-08-02 MED ORDER — PHENOL 1.4 % MT LIQD: 1.0000 | OROMUCOSAL | Status: DC | PRN

## 2011-08-02 MED ORDER — ALBUTEROL (5 MG/ML) CONTINUOUS INHALATION SOLN
INHALATION_SOLUTION | RESPIRATORY_TRACT | Status: DC | PRN
  Administered 2011-08-02: .27 mg via RESPIRATORY_TRACT

## 2011-08-02 MED ORDER — HYDROMORPHONE HCL PF 1 MG/ML IJ SOLN
0.2500 mg | INTRAMUSCULAR | Status: DC | PRN
  Administered 2011-08-02 (×2): 0.5 mg via INTRAVENOUS

## 2011-08-02 MED ORDER — TIOTROPIUM BROMIDE MONOHYDRATE 18 MCG IN CAPS
18.0000 ug | ORAL_CAPSULE | Freq: Every day | RESPIRATORY_TRACT | Status: DC
  Administered 2011-08-04 – 2011-08-05 (×2): 18 ug via RESPIRATORY_TRACT
  Filled 2011-08-02: qty 5

## 2011-08-02 MED ORDER — ROCURONIUM BROMIDE 100 MG/10ML IV SOLN
INTRAVENOUS | Status: DC | PRN
  Administered 2011-08-02: 50 mg via INTRAVENOUS

## 2011-08-02 MED ORDER — CHOLESTYRAMINE 4 G PO PACK
1.0000 | PACK | Freq: Three times a day (TID) | ORAL | Status: DC
  Administered 2011-08-03 – 2011-08-05 (×8): 1 via ORAL
  Filled 2011-08-02 (×10): qty 1

## 2011-08-02 MED ORDER — MORPHINE SULFATE 2 MG/ML IJ SOLN
INTRAMUSCULAR | Status: AC
  Administered 2011-08-02: 2 mg via INTRAVENOUS
  Filled 2011-08-02: qty 1

## 2011-08-02 MED ORDER — LATANOPROST 0.005 % OP SOLN
1.0000 [drp] | Freq: Every day | OPHTHALMIC | Status: DC
  Administered 2011-08-02 – 2011-08-04 (×3): 1 [drp] via OPHTHALMIC
  Filled 2011-08-02: qty 2.5

## 2011-08-02 MED ORDER — SODIUM CHLORIDE 0.9 % IR SOLN
Status: DC | PRN
  Administered 2011-08-02: 3000 mL
  Administered 2011-08-02: 1000 mL

## 2011-08-02 MED ORDER — ACETAMINOPHEN 10 MG/ML IV SOLN
INTRAVENOUS | Status: AC
  Filled 2011-08-02: qty 100

## 2011-08-02 MED ORDER — LIDOCAINE HCL (CARDIAC) 20 MG/ML IV SOLN
INTRAVENOUS | Status: DC | PRN
  Administered 2011-08-02: 85 mg via INTRAVENOUS

## 2011-08-02 MED ORDER — ACETAMINOPHEN 325 MG PO TABS
650.0000 mg | ORAL_TABLET | Freq: Four times a day (QID) | ORAL | Status: DC | PRN
  Administered 2011-08-05: 650 mg via ORAL
  Filled 2011-08-02: qty 2

## 2011-08-02 MED ORDER — HYDROMORPHONE HCL PF 1 MG/ML IJ SOLN
INTRAMUSCULAR | Status: AC
  Filled 2011-08-02: qty 1

## 2011-08-02 SURGICAL SUPPLY — 68 items
BANDAGE ELASTIC 6 VELCRO ST LF (GAUZE/BANDAGES/DRESSINGS) ×2 IMPLANT
BANDAGE ESMARK 6X9 LF (GAUZE/BANDAGES/DRESSINGS) ×1 IMPLANT
BLADE SAGITTAL 25.0X1.19X90 (BLADE) ×2 IMPLANT
BLADE SAW SGTL 13.0X1.19X90.0M (BLADE) ×2 IMPLANT
BNDG ESMARK 6X9 LF (GAUZE/BANDAGES/DRESSINGS) ×2
BOWL SMART MIX CTS (DISPOSABLE) ×2 IMPLANT
CEMENT HV SMART SET (Cement) ×2 IMPLANT
CLOTH BEACON ORANGE TIMEOUT ST (SAFETY) ×2 IMPLANT
COVER BACK TABLE 24X17X13 BIG (DRAPES) IMPLANT
COVER SURGICAL LIGHT HANDLE (MISCELLANEOUS) ×2 IMPLANT
CUFF TOURNIQUET SINGLE 34IN LL (TOURNIQUET CUFF) ×2 IMPLANT
CUFF TOURNIQUET SINGLE 44IN (TOURNIQUET CUFF) IMPLANT
DRAPE INCISE IOBAN 66X45 STRL (DRAPES) ×2 IMPLANT
DRAPE ORTHO SPLIT 77X108 STRL (DRAPES) ×2
DRAPE PROXIMA HALF (DRAPES) ×4 IMPLANT
DRAPE SURG ORHT 6 SPLT 77X108 (DRAPES) ×2 IMPLANT
DRAPE U-SHAPE 47X51 STRL (DRAPES) ×2 IMPLANT
DRSG PAD ABDOMINAL 8X10 ST (GAUZE/BANDAGES/DRESSINGS) IMPLANT
DURAPREP 26ML APPLICATOR (WOUND CARE) ×2 IMPLANT
ELECT REM PT RETURN 9FT ADLT (ELECTROSURGICAL) ×2
ELECTRODE REM PT RTRN 9FT ADLT (ELECTROSURGICAL) ×1 IMPLANT
EVACUATOR 1/8 PVC DRAIN (DRAIN) ×2 IMPLANT
FACESHIELD LNG OPTICON STERILE (SAFETY) ×2 IMPLANT
GAUZE XEROFORM 1X8 LF (GAUZE/BANDAGES/DRESSINGS) ×2 IMPLANT
GAUZE XEROFORM 5X9 LF (GAUZE/BANDAGES/DRESSINGS) IMPLANT
GLOVE BIO SURGEON STRL SZ7.5 (GLOVE) IMPLANT
GLOVE BIOGEL PI IND STRL 7.0 (GLOVE) ×1 IMPLANT
GLOVE BIOGEL PI IND STRL 8 (GLOVE) ×1 IMPLANT
GLOVE BIOGEL PI INDICATOR 7.0 (GLOVE) ×1
GLOVE BIOGEL PI INDICATOR 8 (GLOVE) ×1
GLOVE ECLIPSE 7.0 STRL STRAW (GLOVE) IMPLANT
GLOVE EUDERMIC 7 POWDERFREE (GLOVE) ×2 IMPLANT
GLOVE ORTHO TXT STRL SZ7.5 (GLOVE) ×4 IMPLANT
GLOVE SURG SS PI 6.5 STRL IVOR (GLOVE) ×2 IMPLANT
GLOVE SURG SS PI 7.0 STRL IVOR (GLOVE) ×2 IMPLANT
GOWN PREVENTION PLUS LG XLONG (DISPOSABLE) IMPLANT
GOWN PREVENTION PLUS XLARGE (GOWN DISPOSABLE) ×2 IMPLANT
GOWN STRL NON-REIN LRG LVL3 (GOWN DISPOSABLE) IMPLANT
GOWN STRL REIN XL XLG (GOWN DISPOSABLE) ×4 IMPLANT
HANDPIECE INTERPULSE COAX TIP (DISPOSABLE) ×1
IMMOBILIZER KNEE 22 UNIV (SOFTGOODS) ×2 IMPLANT
KIT BASIN OR (CUSTOM PROCEDURE TRAY) ×2 IMPLANT
KIT ROOM TURNOVER OR (KITS) ×2 IMPLANT
MANIFOLD NEPTUNE II (INSTRUMENTS) ×2 IMPLANT
MARKER SPHERE PSV REFLC THRD 5 (MARKER) ×6 IMPLANT
NEEDLE SPNL 18GX3.5 QUINCKE PK (NEEDLE) IMPLANT
NS IRRIG 1000ML POUR BTL (IV SOLUTION) ×2 IMPLANT
PACK TOTAL JOINT (CUSTOM PROCEDURE TRAY) ×2 IMPLANT
PAD ARMBOARD 7.5X6 YLW CONV (MISCELLANEOUS) ×4 IMPLANT
PADDING CAST COTTON 6X4 STRL (CAST SUPPLIES) IMPLANT
PIN SCHANZ 4MM 130MM (PIN) IMPLANT
SET HNDPC FAN SPRY TIP SCT (DISPOSABLE) ×1 IMPLANT
SET PAD KNEE POSITIONER (MISCELLANEOUS) ×2 IMPLANT
SPONGE GAUZE 4X4 12PLY (GAUZE/BANDAGES/DRESSINGS) IMPLANT
STAPLER VISISTAT 35W (STAPLE) ×2 IMPLANT
STRIP CLOSURE SKIN 1/2X4 (GAUZE/BANDAGES/DRESSINGS) IMPLANT
SUCTION FRAZIER TIP 10 FR DISP (SUCTIONS) ×2 IMPLANT
SUT ETHILON 3 0 PS 1 (SUTURE) ×2 IMPLANT
SUT VIC AB 1 CT1 27 (SUTURE) ×2
SUT VIC AB 1 CT1 27XBRD ANBCTR (SUTURE) ×2 IMPLANT
SUT VIC AB 2-0 CT1 27 (SUTURE) ×2
SUT VIC AB 2-0 CT1 TAPERPNT 27 (SUTURE) ×2 IMPLANT
SUT VIC AB 4-0 PS2 27 (SUTURE) ×2 IMPLANT
SWABSTICK BENZOIN STERILE (MISCELLANEOUS) IMPLANT
TOWEL OR 17X24 6PK STRL BLUE (TOWEL DISPOSABLE) ×2 IMPLANT
TOWEL OR 17X26 10 PK STRL BLUE (TOWEL DISPOSABLE) ×2 IMPLANT
TRAY FOLEY CATH 14FR (SET/KITS/TRAYS/PACK) ×2 IMPLANT
WATER STERILE IRR 1000ML POUR (IV SOLUTION) ×4 IMPLANT

## 2011-08-02 NOTE — Progress Notes (Signed)
Orthopedic Tech Progress Note Patient Details:  Ahmaad Neidhardt 1945/10/22 [id number]  CPM Right Knee CPM Right Knee: On Right Knee Flexion (Degrees): 60  Right Knee Extension (Degrees): 0  Additional Comments: trapeze bar   Cammer, Mickie Bail 08/02/2011, 4:00 PM

## 2011-08-02 NOTE — Anesthesia Preprocedure Evaluation (Addendum)
Anesthesia Evaluation    History of Anesthesia Complications Negative for: history of anesthetic complications  Airway Mallampati: I TM Distance: >3 FB Neck ROM: Full    Dental  (+) Upper Dentures, Lower Dentures and Dental Advisory Given   Pulmonary shortness of breath and with exertion, COPD COPD inhaler, Current Smoker,          Cardiovascular hypertension, Pt. on medications     Neuro/Psych Anxiety Depression negative neurological ROS     GI/Hepatic GERD-  Controlled,(+) Hepatitis -, C  Endo/Other  negative endocrine ROS  Renal/GU negative Renal ROS  negative genitourinary   Musculoskeletal negative musculoskeletal ROS (+)   Abdominal   Peds  Hematology Aspirin q day   Anesthesia Other Findings   Reproductive/Obstetrics                          Anesthesia Physical Anesthesia Plan  ASA: III  Anesthesia Plan: General   Post-op Pain Management:    Induction: Intravenous  Airway Management Planned: Oral ETT  Additional Equipment:   Intra-op Plan:   Post-operative Plan: Extubation in OR  Informed Consent: I have reviewed the patients History and Physical, chart, labs and discussed the procedure including the risks, benefits and alternatives for the proposed anesthesia with the patient or authorized representative who has indicated his/her understanding and acceptance.   Dental advisory given  Plan Discussed with: CRNA  Anesthesia Plan Comments:         Anesthesia Quick Evaluation

## 2011-08-02 NOTE — Transfer of Care (Signed)
Immediate Anesthesia Transfer of Care Note  Patient: [name]  Procedure(s) Performed: Procedure(s) (LRB): COMPUTER ASSISTED TOTAL KNEE ARTHROPLASTY (Right)  Patient Location: PACU  Anesthesia Type: General  Level of Consciousness: awake, alert  and patient cooperative  Airway & Oxygen Therapy: Patient Spontanous Breathing and Patient connected to nasal cannula oxygen  Post-op Assessment: Report given to PACU RN, Post -op Vital signs reviewed and stable and Patient moving all extremities  Post vital signs: Reviewed and stable  Complications: No apparent anesthesia complications

## 2011-08-02 NOTE — Anesthesia Postprocedure Evaluation (Signed)
  Anesthesia Post-op Note  Patient: [name]  Procedure(s) Performed: Procedure(s) (LRB): COMPUTER ASSISTED TOTAL KNEE ARTHROPLASTY (Right)  Patient Location: PACU  Anesthesia Type: GA combined with regional for post-op pain  Level of Consciousness: awake, alert , oriented and patient cooperative  Airway and Oxygen Therapy: Patient Spontanous Breathing and Patient connected to nasal cannula oxygen  Post-op Pain: mild  Post-op Assessment: Post-op Vital signs reviewed, Patient's Cardiovascular Status Stable, Respiratory Function Stable, Patent Airway, No signs of Nausea or vomiting and Pain level controlled  Post-op Vital Signs: stable  Complications: No apparent anesthesia complications

## 2011-08-02 NOTE — Progress Notes (Signed)
Attempted IV start with 18 gauge in left hand. Threaded IV and then IV infiltrated immediately after fluids were applied. IV fluids stopped and removed IV. Pressure applied to site and swelling appeared to be controlled and stopped. Restarted IV in left forearm without difficulty. Then noticed swelling had spread to most of hand. Sarah, CRNA was at bedside. Additional pressure applied along with a pressure dressing. Ice applied to site. Initially Dr. Noreene Larsson made aware. Dr. Katrinka Blazing then made aware of same.

## 2011-08-02 NOTE — Progress Notes (Signed)
Hemovac output at 2030 was 400cc.  Clamped beginning at 2030 for 4 hours per protocol. Pt taken out of CPM for now. Will continue to monitor.

## 2011-08-02 NOTE — H&P (Signed)
[name] is an 66 y.o. male.   Chief Complaint:   Severe right knee pain HPI:   66 yo male with well-documented OA of his right knee.  He has failed conservative treatment including injections, rest, NSAIDs. Etc.  He wishes to proceed with a total knee replacement.  The goals are decreased pain and improved mobility.  The risks are blood loss, nerve and vessel injury, infection, DVT and PE.  He gives informed consent to proceed.  Past Medical History  Diagnosis Date  . Abnormal liver function tests 01/2010     on hospital admission February 23, 2010 AP = 36, AST = 62, ALT = 40, GGT = 392  . COPD (chronic obstructive pulmonary disease)   . Hypertension   . Hyperlipidemia   . GERD (gastroesophageal reflux disease)   . Anxiety   . Depression   . Pruritus 06/2009     this is most likely secondary to cholestasis, last bilirubin 2.7 (02-25-2010)  . Hepatitis C 2012     a hepatitis panel done February 19, 1998 showed anti-HBc +, anti-HBV +, hep C ab reactive, February 23, 2010 hepatitis B DNA not detected, HCV RNA  VL = 444000  . Shortness of breath     with exereize    Past Surgical History  Procedure Date  . Open anterior shoulder reconstruction 2003     surgery performed June 26, 2001 by Dr. Rennis Chris, open right shoulder anterior reconstruction secondary to recurrent right shoulder anterior inferior dislocation    Family History  Problem Relation Age of Onset  . Stroke Neg Hx   . Cancer Neg Hx    Social History:  reports that he has been smoking Cigarettes.  He has a 73.5 pack-year smoking history. He does not have any smokeless tobacco history on file. He reports that he does not drink alcohol or use illicit drugs.  Allergies:  Allergies  Allergen Reactions  . Celecoxib     REACTION: strange behavior  . Penicillins   . Sulfa Antibiotics   . Sulfonamide Derivatives     REACTION: itching    Medications Prior to Admission  Medication Sig Dispense Refill  . albuterol (VENTOLIN  HFA) 108 (90 BASE) MCG/ACT inhaler Inhale 2 puffs into the lungs every 4 (four) hours as needed. For shortness of breath or wheezing.  1 Inhaler  5  . aspirin EC 81 MG tablet Take 81 mg by mouth daily.      . cholestyramine (QUESTRAN) 4 G packet Take 1 packet by mouth 3 (three) times daily with meals.      . citalopram (CELEXA) 20 MG tablet Take 20 mg by mouth daily.      . Fluticasone-Salmeterol (ADVAIR DISKUS) 250-50 MCG/DOSE AEPB Inhale 1 puff into the lungs 2 (two) times daily.  60 each  5  . HYDROcodone-acetaminophen (VICODIN) 5-500 MG per tablet Take 1 tablet by mouth every 6 (six) hours as needed. For pain.      Marland Kitchen lisinopril (PRINIVIL,ZESTRIL) 5 MG tablet Take 5 mg by mouth daily.      Marland Kitchen omeprazole (PRILOSEC) 40 MG capsule Take 40 mg by mouth daily.      Marland Kitchen tiotropium (SPIRIVA) 18 MCG inhalation capsule Place 1 capsule (18 mcg total) into inhaler and inhale daily.  30 capsule  5  . latanoprost (XALATAN) 0.005 % ophthalmic solution Place 1 drop into both eyes at bedtime.      . traMADol (ULTRAM) 50 MG tablet Take 50 mg by mouth every  8 (eight) hours as needed. For pain.        No results found for this or any previous visit (from the past 48 hour(s)). No results found.  Review of Systems  HENT: Positive for hearing loss.   All other systems reviewed and are negative.    Blood pressure 144/83, pulse 74, temperature 97.8 F (36.6 C), temperature source Oral, resp. rate 18, SpO2 96.00%. Physical Exam  Constitutional: He is oriented to person, place, and time. He appears well-developed and well-nourished.  HENT:  Head: Normocephalic and atraumatic.  Eyes: EOM are normal. Pupils are equal, round, and reactive to light.  Neck: Normal range of motion. Neck supple.  Cardiovascular: Normal rate and regular rhythm.   Respiratory: Effort normal and breath sounds normal.  GI: Soft. Bowel sounds are normal.  Musculoskeletal:       Right knee: He exhibits decreased range of motion, swelling  and effusion. tenderness found. Medial joint line and lateral joint line tenderness noted.  Neurological: He is alert and oriented to person, place, and time.  Skin: Skin is warm and dry.  Psychiatric: He has a normal mood and affect.     Assessment/Plan End-stage arthritis tight knee 1) to the OR for a right total knee replacement.  Cira Deyoe Y 08/02/2011, 12:17 PM

## 2011-08-02 NOTE — Brief Op Note (Signed)
08/02/2011  3:05 PM  PATIENT:  [name]  66 y.o. male  PRE-OPERATIVE DIAGNOSIS:  Severe osteoarthritis right knee  POST-OPERATIVE DIAGNOSIS:  Severe osteoarthritis right knee  PROCEDURE:  Procedure(s) (LRB): COMPUTER ASSISTED TOTAL KNEE ARTHROPLASTY (Right)  SURGEON:  Surgeon(s) and Role:    * [name], MD - Primary  PHYSICIAN ASSISTANT:   ASSISTANTS: April Greene, RNFA   ANESTHESIA:   regional and general  EBL:  Total I/O In: 1250 [I.V.:1250] Out: 375 [Urine:275; Blood:100]  BLOOD ADMINISTERED:none  DRAINS: (medium) Hemovact drain(s) in the knee joint with  Suction Open   LOCAL MEDICATIONS USED:  NONE  SPECIMEN:  No Specimen  DISPOSITION OF SPECIMEN:  N/A  COUNTS:  YES  TOURNIQUET:   Total Tourniquet Time Documented: Thigh (Right) - 73 minutes  DICTATION: .Other Dictation: Dictation Number [id number]  PLAN OF CARE: Admit to inpatient   PATIENT DISPOSITION:  PACU - hemodynamically stable.   Delay start of Pharmacological VTE agent (>24hrs) due to surgical blood loss or risk of bleeding: no

## 2011-08-02 NOTE — Anesthesia Procedure Notes (Signed)
Anesthesia Regional Block:  Femoral nerve block  Pre-Anesthetic Checklist: ,, timeout performed, Correct Patient, Correct Site, Correct Laterality, Correct Procedure, Correct Position, site marked, Risks and benefits discussed,  Surgical consent,  Pre-op evaluation,  At surgeon's request and post-op pain management  Laterality: Right  Prep: Maximum Sterile Barrier Precautions used, chloraprep and alcohol swabs       Needles:  Injection technique: Single-shot  Needle Type: Stimulator Needle - 80        Needle insertion depth: 8 cm   Additional Needles:  Procedures: nerve stimulator Femoral nerve block  Nerve Stimulator or Paresthesia:  Response: 0.5 mA, 0.1 ms, 8 cm  Additional Responses:   Narrative:  Start time: 08/02/2011 12:45 PM End time: 08/02/2011 12:50 PM Injection made incrementally with aspirations every 5 mL.  Performed by: Personally  Anesthesiologist: Maren Beach MD  Additional Notes: 20cc 0.5% Marcaine w/ epi w/o difficulty or discomfort. GES

## 2011-08-03 ENCOUNTER — Encounter (HOSPITAL_COMMUNITY): Payer: Self-pay | Admitting: Orthopaedic Surgery

## 2011-08-03 LAB — BASIC METABOLIC PANEL
BUN: 24 mg/dL — ABNORMAL HIGH (ref 6–23)
Calcium: 8.3 mg/dL — ABNORMAL LOW (ref 8.4–10.5)
Chloride: 103 mEq/L (ref 96–112)
Creatinine, Ser: 1.27 mg/dL (ref 0.50–1.35)
GFR calc Af Amer: 66 mL/min — ABNORMAL LOW (ref >90)
GFR calc non Af Amer: 57 mL/min — ABNORMAL LOW (ref >90)

## 2011-08-03 LAB — CBC
HCT: 35.8 % — ABNORMAL LOW (ref 39.0–52.0)
MCH: 31.4 pg (ref 26.0–34.0)
MCHC: 33.5 g/dL (ref 30.0–36.0)
MCV: 93.7 fL (ref 78.0–100.0)
RDW: 13.6 % (ref 11.5–15.5)

## 2011-08-03 MED ORDER — METHOCARBAMOL 500 MG PO TABS
500.0000 mg | ORAL_TABLET | Freq: Four times a day (QID) | ORAL | Status: DC | PRN
  Administered 2011-08-03 – 2011-08-05 (×5): 500 mg via ORAL
  Filled 2011-08-03 (×4): qty 1

## 2011-08-03 NOTE — Progress Notes (Signed)
Subjective: 1 Day Post-Op Procedure(s) (LRB): COMPUTER ASSISTED TOTAL KNEE ARTHROPLASTY (Right) Patient reports pain as moderate.    Objective: Vital signs in last 24 hours: Temp:  [97.2 F (36.2 C)-99.3 F (37.4 C)] 99.3 F (37.4 C) (07/10 0649) Pulse Rate:  [69-85] 80  (07/10 0649) Resp:  [12-21] 18  (07/10 0649) BP: (117-151)/(60-86) 131/60 mmHg (07/10 0649) SpO2:  [91 %-99 %] 97 % (07/10 0649)  Intake/Output from previous day: 07/09 0701 - 07/10 0700 In: 2230 [P.O.:480; I.V.:1750] Out: 1525 [Urine:700; Drains:725; Blood:100] Intake/Output this shift:     Basename 08/03/11 0500  HGB 12.0*    Basename 08/03/11 0500  WBC 12.3*  RBC 3.82*  HCT 35.8*  PLT 197    Basename 08/03/11 0500  NA 140  K 4.4  CL 103  CO2 28  BUN 24*  CREATININE 1.27  GLUCOSE 143*  CALCIUM 8.3*   No results found for this basename: LABPT:2,INR:2 in the last 72 hours  Neurovascular intact Sensation intact distally Intact pulses distally Dorsiflexion/Plantar flexion intact Incision: dressing C/D/I  Assessment/Plan: 1 Day Post-Op Procedure(s) (LRB): COMPUTER ASSISTED TOTAL KNEE ARTHROPLASTY (Right) Up with therapy (PT/OT)  [name] Y 08/03/2011, 7:09 AM

## 2011-08-03 NOTE — Plan of Care (Signed)
Problem: Consults Goal: Diagnosis- Total Joint Replacement Outcome: Completed/Met Date Met:  08/03/11 Primary Total Knee

## 2011-08-03 NOTE — Progress Notes (Signed)
UR COMPLETED  

## 2011-08-03 NOTE — Progress Notes (Signed)
Physical Therapy Treatment Note   08/03/11 1535  PT Visit Information  Last PT Received On 08/03/11  Assistance Needed +2 (for lines and chair follow)  PT Time Calculation  PT Start Time 1505  PT Stop Time 1531  PT Time Calculation (min) 26 min  Subjective Data  Subjective Pt received supine in bed with 8-9/10 R knee pain but agreeable to amb.  Precautions  Precautions Knee  Restrictions  RLE Weight Bearing WBAT  Cognition  Overall Cognitive Status Appears within functional limits for tasks assessed/performed  Bed Mobility  Bed Mobility Supine to Sit  Supine to Sit 4: Min assist;HOB flat  Details for Bed Mobility Assistance minor use of bed rail, minA for R LE management  Transfers  Transfers Sit to Stand;Stand to Sit  Sit to Stand 3: Mod assist;With upper extremity assist;From bed  Stand to Sit 4: Min assist;With upper extremity assist;To chair/3-in-1 (with assist for R LE management)  Details for Transfer Assistance pt with increased assist for R LE management  Ambulation/Gait  Ambulation/Gait Assistance 4: Min assist  Ambulation Distance (Feet) 40 Feet  Assistive device Rolling walker  Ambulation/Gait Assistance Details trialed no R KI to assist with activating R quad muscle, pt tolerated well. no episodes of knee buckling  Gait Pattern Step-to pattern;Decreased step length - right;Decreased stance time - right;Antalgic  Gait velocity slow  General Gait Details labored effort, significant increased UE WBing  Stairs No  Total Joint Exercises  Heel Slides AAROM;Right;5 reps;Seated  PT - End of Session  Equipment Utilized During Treatment Gait belt  Activity Tolerance Patient limited by pain  Patient left in chair;with call bell/phone within reach  Nurse Communication Mobility status;Weight bearing status  PT - Assessment/Plan  Comments on Treatment Session Pt con't to have minimal R knee ROM tolerance. patient progressing with ambulation but remains to require assist  for safe transfers and ambulation. Pt con't to require 24/7 assist for safe d/c home.  PT Plan Discharge plan remains appropriate;Frequency remains appropriate  PT Frequency 7X/week  Follow Up Recommendations Home health PT;Supervision/Assistance - 24 hour  Equipment Recommended None recommended by PT  Acute Rehab PT Goals  Time For Goal Achievement 08/10/11  Potential to Achieve Goals Good  PT Goal: Supine/Side to Sit - Progress Progressing toward goal  PT Goal: Sit to Supine/Side - Progress Progressing toward goal  PT Goal: Sit to Stand - Progress Progressing toward goal  PT Goal: Ambulate - Progress Progressing toward goal  PT Goal: Perform Home Exercise Program - Progress Progressing toward goal     Pain: 8/10 R knee pain  Lewis Shock, PT, DPT Pager #: [phone] Office #: [phone]

## 2011-08-03 NOTE — Progress Notes (Signed)
Patient ID: [name], male   DOB: Nov 04, 1945, 66 y.o.   MRN: [id number] Slow progress with PT.  If can get home, would require HHPT thru [place].

## 2011-08-03 NOTE — Evaluation (Signed)
Physical Therapy Evaluation Patient Details Name: [name] MRN: [id number] DOB: Jul 28, 1945 Today's Date: 08/03/2011 Time: 4098-1191 PT Time Calculation (min): 33 min  PT Assessment / Plan / Recommendation Clinical Impression  Pt s/p R TKA presenting with increased R knee pain limiting R knee ROM at this time. Patient with decreased R LE strength and decreased activity tolerance. Patient desires to return home with spouse. patient with good home set up and support and anticipate patient to be safe for d/c home when approved by MD.    PT Assessment  Patient needs continued PT services    Follow Up Recommendations  Home health PT;Supervision/Assistance - 24 hour    Barriers to Discharge None      Equipment Recommendations  None recommended by PT (equip already delivered)    Recommendations for Other Services     Frequency 7X/week    Precautions / Restrictions Precautions Precautions: Knee Required Braces or Orthoses: Knee Immobilizer - Right Knee Immobilizer - Right: On when out of bed or walking Restrictions Weight Bearing Restrictions: Yes RLE Weight Bearing: Weight bearing as tolerated   Pertinent Vitals/Pain 8-9/10 R knee pain, RN provided pain medication      Mobility  Bed Mobility Bed Mobility: Supine to Sit Supine to Sit: 4: Min assist;HOB flat Details for Bed Mobility Assistance: max directional v/c's for long sit technique to mimic home set up without rails, minA for R LE managemnet Transfers Transfers: Sit to Stand;Stand to Sit Sit to Stand: 4: Min assist;With upper extremity assist;From bed Stand to Sit: 4: Min assist;With armrests;To chair/3-in-1;With upper extremity assist Details for Transfer Assistance: max directional cues for hand placement, safety and R LE management Ambulation/Gait Ambulation/Gait Assistance: 4: Min assist Ambulation Distance (Feet): 30 Feet Assistive device: Rolling walker Ambulation/Gait Assistance Details: pt with improved R  LE WBing with time, max v/c's for walker sequencing Gait Pattern: Step-to pattern;Decreased step length - right;Decreased stance time - right;Antalgic Gait velocity: slow General Gait Details: 2 standing rest breaks Stairs: No    Exercises Total Joint Exercises Ankle Circles/Pumps: AROM;Both;10 reps;Supine Quad Sets: Right;AAROM;5 reps;Supine (minimal contraction) Heel Slides: AAROM;Right;10 reps;Supine (pt with increased pain)   PT Diagnosis: Difficulty walking;Abnormality of gait;Generalized weakness;Acute pain  PT Problem List:   PT Treatment Interventions: DME instruction;Gait training;Stair training;Functional mobility training;Therapeutic activities;Therapeutic exercise   PT Goals Acute Rehab PT Goals PT Goal Formulation: With patient Time For Goal Achievement: 08/10/11 Potential to Achieve Goals: Good Pt will go Supine/Side to Sit: with modified independence;with HOB 0 degrees PT Goal: Supine/Side to Sit - Progress: Goal set today Pt will go Sit to Supine/Side: with modified independence;with HOB 0 degrees PT Goal: Sit to Supine/Side - Progress: Goal set today Pt will go Sit to Stand: with modified independence;with upper extremity assist (up to RW.) PT Goal: Sit to Stand - Progress: Goal set today Pt will Ambulate: >150 feet;with modified independence;with rolling walker PT Goal: Ambulate - Progress: Goal set today Pt will Perform Home Exercise Program: Independently PT Goal: Perform Home Exercise Program - Progress: Goal set today  Visit Information  Last PT Received On: 08/03/11 Assistance Needed: +1    Subjective Data  Subjective: Pt received supine in bed with 8-9/10 R knee pain. Patient Stated Goal: home with spouse   Prior Functioning  Home Living Lives With: Spouse Available Help at Discharge: Family;Available 24 hours/day Type of Home: House Home Access: Level entry Home Layout: One level Bathroom Shower/Tub: Engineer, manufacturing systems:  Standard Bathroom Accessibility: Yes How Accessible: Accessible  via walker Home Adaptive Equipment: Bedside commode/3-in-1;Walker - rolling;Straight cane Prior Function Level of Independence: Independent Driving: Yes Vocation: Retired Musician: HOH Dominant Hand: Left    Cognition  Overall Cognitive Status: Appears within functional limits for tasks assessed/performed Arousal/Alertness: Awake/alert Orientation Level: Oriented X4 / Intact Behavior During Session: WFL for tasks performed    Extremity/Trunk Assessment Right Upper Extremity Assessment RUE ROM/Strength/Tone: Within functional levels Left Upper Extremity Assessment LUE ROM/Strength/Tone: WFL for tasks assessed Right Lower Extremity Assessment RLE ROM/Strength/Tone: Deficits;Due to pain RLE ROM/Strength/Tone Deficits: minimal quad set, decreased active knee rom Left Lower Extremity Assessment LLE ROM/Strength/Tone: Within functional levels Trunk Assessment Trunk Assessment: Normal   Balance    End of Session PT - End of Session Equipment Utilized During Treatment: Gait belt;Right knee immobilizer Activity Tolerance: Patient limited by pain Patient left: in chair;with call bell/phone within reach Nurse Communication: Mobility status;Weight bearing status CPM Right Knee CPM Right Knee: Off  GP     Marcene Brawn 08/03/2011, 9:32 AM  Lewis Shock, PT, DPT Pager #: [phone] Office #: [phone]

## 2011-08-03 NOTE — Clinical Social Work Psychosocial (Signed)
     Clinical Social Work Department BRIEF PSYCHOSOCIAL ASSESSMENT 08/03/2011  Patient:  [name], [name]     Account Number:  [id number]     Admit date:  08/02/2011  Clinical Social Worker:  Robin Searing  Date/Time:  08/03/2011 01:34 PM  Referred by:  Physician  Date Referred:  08/03/2011 Referred for  SNF Placement   Other Referral:   Interview type:  Patient Other interview type:    PSYCHOSOCIAL DATA Living Status:  FAMILY Admitted from facility:   Level of care:   Primary support name:  Okey Dupre- wife Primary support relationship to patient:  FAMILY Degree of support available:   good- patient reports he has multiple caregivers to assist at home.    CURRENT CONCERNS Current Concerns  Post-Acute Placement   Other Concerns:    SOCIAL WORK ASSESSMENT / PLAN Chart reviewed and have spoken with patient who states he plans to d/c home with [facility] and DME. CSW to sign off- please contact us if SW needs arise.  Reece Levy, MSW, Theresia Majors  [phone]   Assessment/plan status:  Other - See comment Other assessment/ plan:   Information/referral to community resources:   HH/DME    PATIENTS/FAMILYS RESPONSE TO PLAN OF CARE: Patient comfortable wtih plans for d/c home.

## 2011-08-03 NOTE — Op Note (Signed)
NAMEVELDON, Neil NO.:  Hancock  MEDICAL RECORD NO.:  [id number]  LOCATION:  5N30C                        FACILITY:  [facility]  PHYSICIAN:  Vanita Panda. 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  PROCEDURE:  Right total knee arthroplasty utilizing computer navigation as assistance.  IMPLANTS:  DePuy PFC Sigma rotating platform knee with size 5 femur, size 5 tibial tray, 12.5-mm polyethylene insert, 35-mm patellar button.  SURGEON:  Vanita Panda. Neil Ivan, MD  ASSISTANT:  April Green, RNFA  ANESTHESIA: 1. Right femoral nerve block. 2. General.  ANTIBIOTICS:  2 g of IV Ancef.  BLOOD LOSS:  100 mL.  TOURNIQUET TIME:  111 minutes.  COMPLICATIONS:  None.  INDICATIONS:  [name] is a 66 year old gentleman with a valgus deformity of his right knee and bone-on-bone wear.  He has an effusion and has difficulty with ambulating due to the severe pain in his knee. The x-rays confirmed bone-on-bone wear as well.  He has failed conservative treatment and wished to proceed with a total knee arthroplasty with the goal of increasing his mobility and decreasing his pain.  The risks and benefits of the surgery were explained to him in detail, and he does wish to proceed with surgery.  PROCEDURE DESCRIPTION:  After informed consent was obtained, appropriate right leg was marked.  Anesthesia was obtained a femoral nerve block. The patient was brought to the operating room and placed supine on the operating table.  General anesthesia was then obtained.  A Foley catheter was placed, and a nonsterile tourniquet was placed around his upper right thigh.  His right leg was then prepped  and draped from the thigh down to the foot with DuraPrep and sterile drapes including a sterile stockinette.  Time-out was called to identify the correct patient and correct right knee.  I then used an Esmarch to wrap out the leg and tourniquet was inflated to 350 mm of pressure.  I then made a midline incision directly over the patella and carried this proximally and distally.  I was able to then perform a medial parapatellar arthrotomy.  Once I dissected down to the joint and found a large effusion with significant synovitis.  I removed synovium in the superior space to the knee and then remnants of the ACL and PCL as well as medial and lateral meniscus.  There was complete bone-on-bone wear in all three compartments.  I then used a rongeur to remove the osteophytes from the knee and I released tissue medially.  I then began with the computer navigation portion of the case.  Two temporary Steinmann pins were placed in the distal tibia through two small stab incisions and through the main incision.  Two Steinmann pins were placed in the metaphyseal section of the distal femur.  Off of these pins, small globes were placed so we could gained a computer generated model of the knee after selecting points from the hip  down to the ankle and throughout the knee. Then with the knee flexed, we placed our cutting block and made our tibial cut about 10 mm off the high side, accordance with our navigated plan.  I then turned attention to the femur.  I made my distal femoral cut and we used an extension block to confirm with a 12.5-mm extension block that we were balanced in extension with equal medial and lateral space.  Next, we set our femoral rotation guide after we sized for size 5 femur and we made our anterior and posterior cuts as well as our chamfer cuts.  Attention was turned back to the tibia again and we sized for a size 5 tibia and we drilled the post and punched for the keel for this.   With the trial tibia in place and then the trial femur, we trialed several polyethylene inserts and I felt that the 12.5 offer the best range of motion and stability in varus and valgus and anterior and posterior stability and it gave [place] a lined knee with neutral alignment and equal joint spaces on our navigation plan.  I then cut the patella and drilled for a size 35 patella.  With all trial components in place, I put the knee through range of motion.  I was very pleased with the stability.  I then removed all Steinmann pins and removed all trial components.  I copiously irrigated the knee with normal saline solution using pulsatile lavage.  We removed all Steinmann pins as well prior to cementing the components.  I then cemented the real size 5 tibial tray followed by the real size 5 femur.  We placed the real 12.5-mm polyethylene insert and cemented the 35-mm patellar button.  Once the cement had dried, the tourniquet was let down and hemostasis was obtained with electrocautery.  I placed a medium Hemovac drain into the knee joint itself and closed the arthrotomy with a running 0 V-Loc suture followed by 2-0 Vicryl in the subcutaneous tissue and staples on the skin.  Well-padded sterile dressing was applied.  A knee immobilizer was placed as well, and the patient was awakened, extubated, taken to the recovery room in stable condition.  All final counts were correct. There were no complications noted.     Vanita Panda. [name], M.D.     CYB/MEDQ  D:  08/02/2011  T:  08/03/2011  Job:  [id number]

## 2011-08-04 LAB — CBC
HCT: 31.4 % — ABNORMAL LOW (ref 39.0–52.0)
MCHC: 34.1 g/dL (ref 30.0–36.0)
Platelets: 182 10*3/uL (ref 150–400)
RDW: 13.4 % (ref 11.5–15.5)

## 2011-08-04 NOTE — Progress Notes (Signed)
Physical Therapy Treatment Patient Details Name: [name] MRN: [id number] DOB: 1945-02-28 Today's Date: 08/04/2011 Time: 5784-6962 PT Time Calculation (min): 29 min  PT Assessment / Plan / Recommendation Comments on Treatment Session  Pt slowly progressing with PT goals & mobility.  Pt cont's to c/o significant pain level in Rt knee.   Required mod/max directional cueing throughout entire session for safe technique with tasks.      Follow Up Recommendations  Home health PT;Supervision/Assistance - 24 hour    Barriers to Discharge        Equipment Recommendations  None recommended by PT    Recommendations for Other Services    Frequency 7X/week   Plan Discharge plan remains appropriate;Frequency remains appropriate    Precautions / Restrictions Precautions Precautions: Knee Required Braces or Orthoses: Knee Immobilizer - Right Knee Immobilizer - Right: On when out of bed or walking Restrictions RLE Weight Bearing: Weight bearing as tolerated     Pertinent Vitals/Pain 9/10  Knee.  RN notified.  Pain medication administered by RN.      Mobility  Bed Mobility Bed Mobility: Not assessed Transfers Transfers: Sit to Stand;Stand to Sit Sit to Stand: 4: Min assist;With upper extremity assist;With armrests;From chair/3-in-1 Stand to Sit: 4: Min assist;With armrests;With upper extremity assist;To chair/3-in-1 Details for Transfer Assistance: VC for hand placement and extension of RLE. Assist to achieve full upright standing Ambulation/Gait Ambulation/Gait Assistance: 4: Min assist Ambulation Distance (Feet): 50 Feet Assistive device: Rolling walker Ambulation/Gait Assistance Details: Cues for tall posture, RW advancement, use of UE's to off-load RLE due to pain during stance phase, cues for increased heel strike.   Gait Pattern: Step-to pattern;Decreased stance time - right;Decreased step length - left;Decreased hip/knee flexion - right;Decreased hip/knee flexion -  left;Antalgic    Exercises Total Joint Exercises Ankle Circles/Pumps: AROM;Both;15 reps Quad Sets: AROM;Both;15 reps Straight Leg Raises: AAROM;Right;10 reps     PT Goals Acute Rehab PT Goals Time For Goal Achievement: 08/10/11 Potential to Achieve Goals: Good PT Goal: Sit to Stand - Progress: Progressing toward goal PT Goal: Ambulate - Progress: Progressing toward goal PT Goal: Perform Home Exercise Program - Progress: Progressing toward goal  Visit Information  Last PT Received On: 08/04/11 Assistance Needed: +1    Subjective Data      Cognition  Overall Cognitive Status: Difficult to assess Difficult to assess due to: Hard of hearing/deaf Arousal/Alertness: Awake/alert Behavior During Session: [facility] for tasks performed Cognition - Other Comments: slow to process cues & required mod>max directional cueing for technique.      Balance     End of Session PT - End of Session Equipment Utilized During Treatment: Gait belt Activity Tolerance: Patient tolerated treatment well;Patient limited by pain Patient left: in chair;with call bell/phone within reach Nurse Communication: Mobility status    Verdell Face, [place] [phone] 08/04/2011

## 2011-08-04 NOTE — Evaluation (Signed)
Occupational Therapy Evaluation Patient Details Name: [name] MRN: [id number] DOB: 1945-03-17 Today's Date: 08/04/2011 Time: 4098-1191 OT Time Calculation (min): 17 min  OT Assessment / Plan / Recommendation Clinical Impression  Pt s/p Rt TKA and presents with pain, generalized weakness and decreased I with ADL. Pt will benefit from skilled OT in the acute setting to maximize I with ADL and ADL mobility prior to d/c    OT Assessment  Patient needs continued OT Services    Follow Up Recommendations  Home health OT;Supervision/Assistance - 24 hour (vs SNF)    Barriers to Discharge      Equipment Recommendations  None recommended by PT (May need shower seat- TBD)    Recommendations for Other Services    Frequency  Min 2X/week    Precautions / Restrictions Precautions Precautions: Knee Required Braces or Orthoses: Knee Immobilizer - Right Knee Immobilizer - Right: On when out of bed or walking Restrictions Weight Bearing Restrictions: Yes RLE Weight Bearing: Weight bearing as tolerated   Pertinent Vitals/Pain Pt reports rt knee pain but did not rate; pre-medicated and repositioned    ADL  Eating/Feeding: Simulated;Independent Where Assessed - Eating/Feeding: Chair Grooming: Simulated;Minimal assistance Where Assessed - Grooming: Supported standing Lower Body Dressing: Simulated;Moderate assistance Where Assessed - Lower Body Dressing: Supported sit to Pharmacist, hospital: Mining engineer Method: Sit to Barista: Materials engineer and Hygiene: Simulated;Minimal assistance Where Assessed - Engineer, mining and Hygiene: Standing Equipment Used: Rolling walker Transfers/Ambulation Related to ADLs: +2total A(pt=75%) with RW ambulation (for safety) ADL Comments: Pt with questionable cognitive deficits vs. HOH. Currently pt not safe to return home, but this will depending on  amount of assist family is able to provide    OT Diagnosis: Generalized weakness;Acute pain  OT Problem List: Decreased strength;Decreased activity tolerance;Impaired balance (sitting and/or standing);Decreased knowledge of use of DME or AE;Pain;Decreased knowledge of precautions OT Treatment Interventions: Self-care/ADL training;DME and/or AE instruction;Therapeutic activities;Patient/family education;Balance training   OT Goals Acute Rehab OT Goals OT Goal Formulation: With patient Time For Goal Achievement: 08/11/11 Potential to Achieve Goals: Good ADL Goals Pt Will Perform Grooming: with modified independence;Standing at sink ADL Goal: Grooming - Progress: Goal set today Pt Will Perform Lower Body Bathing: with supervision;with set-up;Sit to stand from chair;Sit to stand from bed;Sit to stand in shower ADL Goal: Lower Body Bathing - Progress: Goal set today Pt Will Perform Lower Body Dressing: with min assist;with adaptive equipment;Sit to stand from chair;Sit to stand from bed ADL Goal: Lower Body Dressing - Progress: Goal set today Pt Will Transfer to Toilet: with modified independence;Ambulation;with DME;3-in-1 ADL Goal: Toilet Transfer - Progress: Goal set today Pt Will Perform Toileting - Clothing Manipulation: Independently;Standing ADL Goal: Toileting - Clothing Manipulation - Progress: Goal set today Pt Will Perform Tub/Shower Transfer: Tub transfer;with min assist;with DME;Ambulation ADL Goal: Tub/Shower Transfer - Progress: Goal set today  Visit Information  Last OT Received On: 08/04/11 Assistance Needed: +2 PT/OT Co-Evaluation/Treatment: Yes    Subjective Data  Subjective: I'm feeling a little better today Patient Stated Goal: Return home with wife and dtr   Prior Functioning  Home Living Lives With: Spouse Available Help at Discharge: Family;Available 24 hours/day Type of Home: House Home Access: Level entry Home Layout: One level Bathroom Shower/Tub:  Engineer, manufacturing systems: Standard Bathroom Accessibility: Yes How Accessible: Accessible via walker Home Adaptive Equipment: Bedside commode/3-in-1;Walker - rolling;Straight cane Prior Function Level of Independence: Independent Driving: Yes Vocation: Retired  Communication Communication: HOH Dominant Hand: Left    Cognition  Overall Cognitive Status: Difficult to assess Difficult to assess due to: Hard of hearing/deaf Arousal/Alertness: Awake/alert Behavior During Session: [facility] for tasks performed    Extremity/Trunk Assessment Right Upper Extremity Assessment RUE ROM/Strength/Tone: [facility] for tasks assessed Left Upper Extremity Assessment LUE ROM/Strength/Tone: WFL for tasks assessed   Mobility Transfers Sit to Stand: 4: Min assist;With armrests;From chair/3-in-1 Stand to Sit: 4: Min assist;With armrests;To chair/3-in-1 Details for Transfer Assistance: VC for hand placement and extension of RLE. Assist to achieve full upright standing   Exercise    Balance    End of Session OT - End of Session Equipment Utilized During Treatment: Gait belt Activity Tolerance: Patient tolerated treatment well Patient left: in chair;with call bell/phone within reach Nurse Communication: Mobility status  GO     Allysia Ingles 08/04/2011, 11:02 AM

## 2011-08-04 NOTE — Progress Notes (Signed)
Subjective: 2 Days Post-Op Procedure(s) (LRB): COMPUTER ASSISTED TOTAL KNEE ARTHROPLASTY (Right) Patient reports pain as moderate.    Objective: Vital signs in last 24 hours: Temp:  [98.6 F (37 C)-99.3 F (37.4 C)] 98.6 F (37 C) (07/11 4540) Pulse Rate:  [80-99] 89  (07/11 0643) Resp:  [18-22] 20  (07/11 0643) BP: (106-131)/(56-62) 114/62 mmHg (07/11 0643) SpO2:  [93 %-99 %] 98 % (07/11 0643)  Intake/Output from previous day: 07/10 0701 - 07/11 0700 In: 1590 [P.O.:840; I.V.:750] Out: 650 [Urine:650] Intake/Output this shift: Total I/O In: -  Out: 300 [Urine:300]   Basename 08/03/11 0500  HGB 12.0*    Basename 08/03/11 0500  WBC 12.3*  RBC 3.82*  HCT 35.8*  PLT 197    Basename 08/03/11 0500  NA 140  K 4.4  CL 103  CO2 28  BUN 24*  CREATININE 1.27  GLUCOSE 143*  CALCIUM 8.3*   No results found for this basename: LABPT:2,INR:2 in the last 72 hours  Sensation intact distally Intact pulses distally Dorsiflexion/Plantar flexion intact Incision: scant drainage  Assessment/Plan: 2 Days Post-Op Procedure(s) (LRB): COMPUTER ASSISTED TOTAL KNEE ARTHROPLASTY (Right) Up with therapy Discharge home with home health hopefully if starts to progress with mobility.  [name] 08/04/2011, 6:47 AM

## 2011-08-04 NOTE — Progress Notes (Signed)
Physical Therapy Treatment Patient Details Name: Dionte Blaustein MRN: [id number] DOB: [birth year] Today's Date: 08/04/2011 Time: 3086-5784 PT Time Calculation (min): 26 min  PT Assessment / Plan / Recommendation Comments on Treatment Session  Pt able to progress with ambulation distance this session.  Still needing frequent cueing for technique with mobility    Follow Up Recommendations  Home health PT;Supervision/Assistance - 24 hour    Barriers to Discharge        Equipment Recommendations  None recommended by PT    Recommendations for Other Services    Frequency 7X/week   Plan Discharge plan remains appropriate;Frequency remains appropriate    Precautions / Restrictions Precautions Precautions: Knee Required Braces or Orthoses: Knee Immobilizer - Right Knee Immobilizer - Right: On when out of bed or walking Restrictions RLE Weight Bearing: Weight bearing as tolerated     Pertinent Vitals/Pain 8/10 knee.  RN notified.  Pain meds given by RN.      Mobility  Bed Mobility Bed Mobility: Supine to Sit Supine to Sit: 4: Min assist Details for Bed Mobility Assistance: mod directional cues for technique.  slow to process cues & difficulty problem solving to initiate task.   Transfers Transfers: Sit to Stand;Stand to Sit Sit to Stand: 4: Min guard;With upper extremity assist;From bed Stand to Sit: 4: Min guard;With upper extremity assist;With armrests;To chair/3-in-1 Details for Transfer Assistance: cues for hand placement & technique.  Ambulation/Gait Ambulation/Gait Assistance: 4: Min guard Ambulation Distance (Feet): 100 Feet Assistive device: Rolling walker Ambulation/Gait Assistance Details: cues for tall posture, safe body positioning inside RW, safety with directional changes.   Gait Pattern: Step-to pattern;Decreased stance time - right;Antalgic;Decreased step length - left;Decreased hip/knee flexion - right General Gait Details: able to progress with distance but still  labored effort.   Stairs: No    Exercises Total Joint Exercises Ankle Circles/Pumps: AROM;Both;15 reps Quad Sets: AROM;Both;15 reps Heel Slides: AAROM;Right;10 reps Straight Leg Raises: AAROM;Right;10 reps     PT Goals Acute Rehab PT Goals Time For Goal Achievement: 08/10/11 Potential to Achieve Goals: Good PT Goal: Supine/Side to Sit - Progress: Not progressing PT Goal: Sit to Stand - Progress: Progressing toward goal PT Goal: Ambulate - Progress: Progressing toward goal PT Goal: Perform Home Exercise Program - Progress: Progressing toward goal  Visit Information  Last PT Received On: 08/04/11 Assistance Needed: +1    Subjective Data      Cognition  Overall Cognitive Status: Difficult to assess Difficult to assess due to: Hard of hearing/deaf Arousal/Alertness: Awake/alert Orientation Level: Oriented X4 / Intact Behavior During Session: [facility] for tasks performed Cognition - Other Comments: slow to process cues & required mod>max directional cueing for technique.      Balance     End of Session PT - End of Session Equipment Utilized During Treatment: Gait belt Activity Tolerance: Patient tolerated treatment well Patient left: in chair;with call bell/phone within reach Nurse Communication: Mobility status     Verdell Face, [place] [phone] 08/04/2011

## 2011-08-05 LAB — CBC
MCH: 32 pg (ref 26.0–34.0)
Platelets: 187 10*3/uL (ref 150–400)
RBC: 3.09 MIL/uL — ABNORMAL LOW (ref 4.22–5.81)

## 2011-08-05 MED ORDER — ASPIRIN 325 MG PO TBEC: 325.0000 mg | DELAYED_RELEASE_TABLET | Freq: Two times a day (BID) | ORAL | Status: AC

## 2011-08-05 MED ORDER — METHOCARBAMOL 500 MG PO TABS: 500.0000 mg | ORAL_TABLET | Freq: Four times a day (QID) | ORAL | Status: AC | PRN

## 2011-08-05 NOTE — Discharge Summary (Signed)
Patient ID: [name] MRN: [id number] DOB/AGE: Jul 08, 1945 66 y.o.  Admit date: 08/02/2011 Discharge date: 08/05/2011  Admission Diagnoses:  Principal Problem:  *Arthritis of knee, right   Discharge Diagnoses:  Same  Past Medical History  Diagnosis Date  . Abnormal liver function tests 01/2010     on hospital admission February 23, 2010 AP = 36, AST = 62, ALT = 40, GGT = 392  . COPD (chronic obstructive pulmonary disease)   . Hypertension   . Hyperlipidemia   . GERD (gastroesophageal reflux disease)   . Anxiety   . Depression   . Pruritus 06/2009     this is most likely secondary to cholestasis, last bilirubin 2.7 (02-25-2010)  . Hepatitis C 2012     a hepatitis panel done February 19, 1998 showed anti-HBc +, anti-HBV +, hep C ab reactive, February 23, 2010 hepatitis B DNA not detected, HCV RNA  VL = 444000  . Shortness of breath     with exereize    Surgeries: Procedure(s): COMPUTER ASSISTED TOTAL KNEE ARTHROPLASTY on 08/02/2011   Consultants:    Discharged Condition: Improved  Hospital Course: [name] is an 66 y.o. male who was admitted 08/02/2011 for operative treatment ofArthritis of knee, right. Patient has severe unremitting pain that affects sleep, daily activities, and work/hobbies. After pre-op clearance the patient was taken to the operating room on 08/02/2011 and underwent  Procedure(s): COMPUTER ASSISTED TOTAL KNEE ARTHROPLASTY.    Patient was given perioperative antibiotics: Anti-infectives     Start     Dose/Rate Route Frequency Ordered Stop   08/02/11 1900   ceFAZolin (ANCEF) IVPB 1 g/50 mL premix        1 g 100 mL/hr over 30 Minutes Intravenous Every 6 hours 08/02/11 1810 August 24, 2011 0453   08/01/11 1445   clindamycin (CLEOCIN) IVPB 600 mg        600 mg 100 mL/hr over 30 Minutes Intravenous  Once 08/01/11 1432 08/02/11 1317           Patient was given sequential compression devices, early ambulation, and chemoprophylaxis to prevent DVT.  Patient  benefited maximally from hospital stay and there were no complications.    Recent vital signs: Patient Vitals for the past 24 hrs:  BP Temp Temp src Pulse Resp SpO2  08/05/11 0509 138/76 mmHg 98.4 F (36.9 C) - 98  18  97 %  08/05/11 0400 - - - - 17  99 %  08/05/11 0000 - - - - 14  98 %  08/04/11 2115 132/89 mmHg 98.7 F (37.1 C) Oral 101  18  97 %  08/04/11 2002 - - - 90  15  -  08/04/11 2000 - - - - 16  98 %  08/04/11 1600 - - - - 18  -  08/04/11 1300 144/77 mmHg 98.5 F (36.9 C) - 91  18  96 %  08/04/11 1157 - - - - 16  -  08/04/11 0919 - - - - - 98 %  08/04/11 0800 - - - - 18  -     Recent laboratory studies:  Basename 08/04/11 0714 2011-08-24 0500  WBC 16.6* 12.3*  HGB 10.7* 12.0*  HCT 31.4* 35.8*  PLT 182 197  NA -- 140  K -- 4.4  CL -- 103  CO2 -- 28  BUN -- 24*  CREATININE -- 1.27  GLUCOSE -- 143*  INR -- --  CALCIUM -- 8.3*     Discharge Medications:   Medication List  As of 08/05/2011  6:58 AM   STOP taking these medications         HYDROcodone-acetaminophen 5-500 MG per tablet         TAKE these medications         albuterol 108 (90 BASE) MCG/ACT inhaler   Commonly known as: PROVENTIL HFA;VENTOLIN HFA   Inhale 2 puffs into the lungs every 4 (four) hours as needed. For shortness of breath or wheezing.      aspirin 325 MG EC tablet   Take 1 tablet (325 mg total) by mouth 2 (two) times daily.      cholestyramine 4 G packet   Commonly known as: QUESTRAN   Take 1 packet by mouth 3 (three) times daily with meals.      citalopram 20 MG tablet   Commonly known as: CELEXA   Take 20 mg by mouth daily.      Fluticasone-Salmeterol 250-50 MCG/DOSE Aepb   Commonly known as: ADVAIR   Inhale 1 puff into the lungs 2 (two) times daily.      latanoprost 0.005 % ophthalmic solution   Commonly known as: XALATAN   Place 1 drop into both eyes at bedtime.      lisinopril 5 MG tablet   Commonly known as: PRINIVIL,ZESTRIL   Take 5 mg by mouth daily.       methocarbamol 500 MG tablet   Commonly known as: ROBAXIN   Take 1 tablet (500 mg total) by mouth every 6 (six) hours as needed.      omeprazole 40 MG capsule   Commonly known as: PRILOSEC   Take 40 mg by mouth daily.      tiotropium 18 MCG inhalation capsule   Commonly known as: SPIRIVA   Place 1 capsule (18 mcg total) into inhaler and inhale daily.      traMADol 50 MG tablet   Commonly known as: ULTRAM   Take 50 mg by mouth every 8 (eight) hours as needed. For pain.            Diagnostic Studies: Dg Chest 2 View  07/25/2011  *RADIOLOGY REPORT*  Clinical Data: Preoperative respiratory exam.  Osteoarthritis of the right knee.  CHEST - 2 VIEW  Comparison: 02/23/2010  Findings: The heart size and pulmonary vascularity are normal. There is prominent peribronchial thickening which could be seen with acute or chronic bronchitis.  No infiltrates or effusions. Slight linear scarring in the bases posteriorly.  No acute osseous abnormality.  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  IMPRESSION: Peribronchial thickening suggesting acute or chronic bronchitis. The patient is a long-time smoker.  Original Report Authenticated By: Gwynn Burly, M.D.   X-ray Knee Right Port  08/02/2011  *RADIOLOGY REPORT*  Clinical Data: Right knee osteoarthritis.  PORTABLE RIGHT KNEE - 1-2 VIEW  Comparison: None.  Findings: Patient is status prior right total knee arthroplasty. Satisfactory position and alignment.  No adverse features.  IMPRESSION: As above.  Original Report Authenticated By: Elsie Stain, M.D.    Disposition: 01-Home or Self Care  Discharge Orders    Future Orders Please Complete By Expires   Diet - low sodium heart healthy      Call MD / Call 911      Comments:   If you experience chest pain or shortness of breath, CALL 911 and be transported to the hospital emergency room.  If you develope a fever above 101 F, pus (white drainage) or increased drainage or redness at  the  wound, or calf pain, call your surgeon's office.   Constipation Prevention      Comments:   Drink plenty of fluids.  Prune juice may be helpful.  You may use a stool softener, such as Colace (over the counter) 100 mg twice a day.  Use MiraLax (over the counter) for constipation as needed.   Increase activity slowly as tolerated      Discharge instructions      Comments:   Increase your activities as comfort allows. Ice as needed for swelling. You can get your current dressing wet in the shower. You can get your actual incision wet in the shower starting Monday 7/15. Follow-up at [facility] in 2 weeks [phone])         Signed: [name] 08/05/2011, 6:58 AM

## 2011-08-05 NOTE — Care Management Note (Signed)
    Page 1 of 2   08/05/2011     9:43:01 AM   CARE MANAGEMENT NOTE 08/05/2011  Patient:  [name], [name]   Account Number:  [id number]  Date Initiated:  08/03/2011  Documentation initiated by:  Anette Guarneri  Subjective/Objective Assessment:   POD#1 s/p right TKA     Action/Plan:   [facility] services  DME   Anticipated DC Date:  08/05/2011   Anticipated DC Plan:  HOME W HOME HEALTH SERVICES      DC Planning Services  CM consult      [facility] Choice  DURABLE MEDICAL EQUIPMENT  HOME HEALTH   Choice offered to / List presented to:  C-1 Patient        HH arranged  HH-2 PT  HH-3 OT      [facility] agency  [facility]   Status of service:  Completed, signed off Medicare Important Message given?   (If response is "NO", the following Medicare IM given date fields will be blank) Date Medicare IM given:   Date Additional Medicare IM given:    Discharge Disposition:  HOME W HOME HEALTH SERVICES  Per UR Regulation:  Reviewed for med. necessity/level of care/duration of stay  If discussed at Long Length of Stay Meetings, dates discussed:    Comments:  08/05/11  09:20 Anette Guarneri RN/CM spoke with patient and wife regarding HHPT/OT patient choice is [facility], HHPT/OT to be initiated after d/c.  HOME HEALTH AGENCIES SERVING [place]  Agencies that are Medicare-Certified and are affiliated with [facility] Agency  Telephone Number Address [facility]   [facility] has ownership interest in this company; however, you are under no obligation to use this agency. [phone] or [phone] [address] [place], [place] [address]  Agencies that are Medicare-Certified and are not affiliated with [facility] Agency Telephone Number Address [facility] [phone] Fax [phone] [address] [place], [place]  [address] [facility] [phone] or [phone] Fax [phone] [address] [place], [place] [address] [facility] [phone] Fax [phone] [address] [place], [place] [address] [facility] [phone] Fax [phone] [address] [place], [place]  [address] [facility] [phone] Fax [phone] [address] Suite [place], [place] [address] [facility] [phone] [address] [place], [place] [address] [facility] [phone]  [address] Suite [place], [place] [address] [facility] [phone] or [phone] Fax [phone] [address] [place], [place]  [address] [facility] [phone] Fax [phone] [address] [place], [place]  [address] [facility]  [phone] Fax [phone] [address] [place], [place] [address]     08/03/11  11:00 Anette Guarneri RN/CM Spoke with patient regarding d/c needs, per patient, DME already delivered patient not sure if [facility] services have been arranged by MD office

## 2011-08-05 NOTE — Progress Notes (Signed)
PT Progress Note:     08/05/11 1445  PT Visit Information  Last PT Received On 08/05/11  Assistance Needed +1  PT Time Calculation  PT Start Time 1412  PT Stop Time 1428  PT Time Calculation (min) 16 min  Precautions  Precautions Knee  Restrictions  RLE Weight Bearing WBAT  Cognition  Overall Cognitive Status Appears within functional limits for tasks assessed/performed  Difficult to assess due to Hard of hearing/deaf  Arousal/Alertness Awake/alert  Orientation Level Appears intact for tasks assessed  Behavior During Session [facility] for tasks performed  Cognition - Other Comments Pt's daughter present for session & reports she has noticed pt is slower to respond to instructions & seems a little more disoriented at times.    Bed Mobility  Bed Mobility Not assessed  Transfers  Transfers Sit to Stand;Stand to Sit  Sit to Stand 5: Supervision;With upper extremity assist;With armrests;From chair/3-in-1  Stand to Sit 5: Supervision;With upper extremity assist;With armrests;To chair/3-in-1  Details for Transfer Assistance Pt demo'd proper technique.  Educated pt's daughter on safe technique & how to cue & assist pt if needed.   Ambulation/Gait  Ambulation/Gait Assistance 4: Min guard  Ambulation Distance (Feet) 80 Feet  Assistive device Rolling walker  Ambulation/Gait Assistance Details Pt utilizing step-to gait pattern rather than step-through pattern this session.  Encouragement to decrease reliance of UE's on RW & attempt increased weight through RLE if able.  Pt also wore KI this session per pt's daughter request stating she would feel more comfortable with him wearing it today.    Gait Pattern Step-to pattern;Antalgic  Gait velocity slow  PT - End of Session  Equipment Utilized During Treatment Gait belt;Right knee immobilizer  Activity Tolerance Patient tolerated treatment well  Patient left in chair;with call bell/phone within reach;with family/visitor present  Nurse Communication  Mobility status  PT - Assessment/Plan  Comments on Treatment Session Pt's daughter present entire session.  Daughter expresses concern Re: pt with decresaed problem solving & slow to process information which is new since Sx.    PT Plan Discharge plan remains appropriate;Frequency remains appropriate  PT Frequency 7X/week  Follow Up Recommendations Home health PT;Supervision/Assistance - 24 hour  Acute Rehab PT Goals  Time For Goal Achievement 08/10/11  PT Goal: Sit to Stand - Progress Progressing toward goal  PT Goal: Ambulate - Progress Progressing toward goal  PT General Charges  $$ ACUTE PT VISIT 1 Procedure  PT Treatments  $Gait Training 8-22 mins     Verdell Face, [place] [phone] 08/05/2011

## 2011-08-05 NOTE — Progress Notes (Signed)
Patient refused CPM this shift.

## 2011-08-05 NOTE — Progress Notes (Signed)
PT Progress Note:     08/05/11 1400  PT Visit Information  Last PT Received On 08/05/11  Assistance Needed +1  PT Time Calculation  PT Start Time 0747  PT Stop Time 0816  PT Time Calculation (min) 29 min  Precautions  Precautions Knee  Required Braces or Orthoses Knee Immobilizer - Right  Knee Immobilizer - Right On when out of bed or walking  Restrictions  RLE Weight Bearing WBAT  Cognition  Overall Cognitive Status No family/caregiver present to determine baseline cognitive functioning  Difficult to assess due to Hard of hearing/deaf  Arousal/Alertness Awake/alert  Orientation Level Oriented X4 / Intact  Behavior During Session [facility] for tasks performed  Cognition - Other Comments slow to process safety cues.  Bed Mobility  Bed Mobility Supine to Sit;Sitting - Scoot to Edge of Bed  Supine to Sit 4: Min assist  Details for Bed Mobility Assistance Cues for technique.  Utilized hooking technique to manage RLE.  Assist for RLE.    Transfers  Transfers Sit to Stand;Stand to Sit  Sit to Stand 4: Min guard;With upper extremity assist;From bed  Stand to Sit 5: Supervision;With upper extremity assist;With armrests;To chair/3-in-1  Details for Transfer Assistance Cues for hand placement & technique.    Ambulation/Gait  Ambulation/Gait Assistance 4: Min guard  Ambulation Distance (Feet) 120 Feet  Assistive device Rolling walker  Ambulation/Gait Assistance Details Trialed step-through gait pattern but pt requiring increased cues for safety.   Cont's to require cues for safe body positioning inside RW, tall posture, decreased lateral weight shifting towards Lt with Rt swing phase.    Gait Pattern Step-to pattern;Step-through pattern;Decreased hip/knee flexion - right (increased weight shifting to Lt with Rt swing phase)  Gait velocity slow  Total Joint Exercises  Knee Flexion AAROM;Right;10 reps;Seated  PT - End of Session  Equipment Utilized During Treatment Gait belt  Activity  Tolerance Patient tolerated treatment well  Patient left in chair;with call bell/phone within reach  Nurse Communication Mobility status  PT - Assessment/Plan  Comments on Treatment Session Pt making steady progress with mobility but still requires cueing for safe technique.  Pt reports plans are for him to d/c home later today- feel pt would benefit from 2nd session later today before d/c.    PT Plan Discharge plan remains appropriate;Frequency remains appropriate  PT Frequency 7X/week  Follow Up Recommendations Home health PT;Supervision/Assistance - 24 hour  Equipment Recommended None recommended by OT  Acute Rehab PT Goals  Time For Goal Achievement 08/10/11  PT Goal: Supine/Side to Sit - Progress Progressing toward goal  PT Goal: Sit to Stand - Progress Progressing toward goal  PT Goal: Ambulate - Progress Progressing toward goal  PT Treatments  $Gait Training 8-22 mins  $Therapeutic Activity 8-22 mins    Pain:  7/10  Knee.  Premedicated per pt.     Verdell Face, [place] [phone] 08/05/2011

## 2011-08-05 NOTE — Progress Notes (Signed)
Occupational Therapy Treatment Patient Details Name: [name] MRN: [id number] DOB: 1945-12-27 Today's Date: 08/05/2011 Time: 4098-1191 OT Time Calculation (min): 27 min  OT Assessment / Plan / Recommendation Comments on Treatment Session Pt continues to require max safety cues for safety and ADLs. Pt will need 24/7 A at home.    Follow Up Recommendations  Home health OT;Supervision/Assistance - 24 hour;Other (comment) (vs SNF )    Barriers to Discharge       Equipment Recommendations  None recommended by OT    Recommendations for Other Services    Frequency Min 2X/week   Plan Discharge plan remains appropriate    Precautions / Restrictions Precautions Precautions: Knee Required Braces or Orthoses: Knee Immobilizer - Right Knee Immobilizer - Right: On when out of bed or walking Restrictions Weight Bearing Restrictions: No RLE Weight Bearing: Weight bearing as tolerated   Pertinent Vitals/Pain     ADL  Grooming: Performed;Wash/dry hands;Min guard Where Assessed - Grooming: Supported standing Lower Body Dressing: Performed;Minimal assistance;Other (comment) (to don/doff shorts.) Where Assessed - Lower Body Dressing: Supported sit to stand Toilet Transfer: Performed;Min Pension scheme manager Method: Sit to Barista: Raised toilet seat with arms (or 3-in-1 over toilet) Toileting - Clothing Manipulation and Hygiene: Performed;Minimal assistance Where Assessed - Engineer, mining and Hygiene: Sit to stand from 3-in-1 or toilet ADL Comments: Pt continues to require multimodal cues for safe manipulation of RW around bathroom. Pt also fatigues quickly. 2/4 DOE. O2 saturation 95% on RA.    OT Diagnosis:    OT Problem List:   OT Treatment Interventions:     OT Goals ADL Goals ADL Goal: Grooming - Progress: Progressing toward goals Pt Will Perform Lower Body Dressing: with supervision ADL Goal: Lower Body Dressing - Progress: Updated  due to goal met ADL Goal: Toilet Transfer - Progress: Progressing toward goals ADL Goal: Toileting - Clothing Manipulation - Progress: Progressing toward goals  Visit Information  Last OT Received On: 08/05/11 Assistance Needed: +1    Subjective Data  Subjective: My daughter is coming to help me in a little while.   Prior Functioning       Cognition  Overall Cognitive Status: No family/caregiver present to determine baseline cognitive functioning Difficult to assess due to: Hard of hearing/deaf Arousal/Alertness: Awake/alert Orientation Level: Oriented X4 / Intact Behavior During Session: [facility] for tasks performed Cognition - Other Comments: slow to process safety cues.    Mobility Transfers Sit to Stand: 4: Min guard;With upper extremity assist;From chair/3-in-1;With armrests Stand to Sit: 4: Min guard;With upper extremity assist;To chair/3-in-1;With armrests Details for Transfer Assistance: Max VCs for hand placement and RLE management.   Exercises    Balance    End of Session OT - End of Session Activity Tolerance: Patient tolerated treatment well Patient left: in chair;with call bell/phone within reach  GO     Jamar Casagrande A OTR/L [phone] 08/05/2011, 11:55 AM

## 2011-08-05 NOTE — Progress Notes (Signed)
Subjective: 3 Days Post-Op Procedure(s) (LRB): COMPUTER ASSISTED TOTAL KNEE ARTHROPLASTY (Right) Patient reports pain as mild.    Objective: Vital signs in last 24 hours: Temp:  [98.4 F (36.9 C)-98.7 F (37.1 C)] 98.4 F (36.9 C) (07/12 0509) Pulse Rate:  [90-101] 98  (07/12 0509) Resp:  [14-18] 18  (07/12 0509) BP: (132-144)/(76-89) 138/76 mmHg (07/12 0509) SpO2:  [96 %-99 %] 97 % (07/12 0509)  Intake/Output from previous day: 07/11 0701 - 07/12 0700 In: 710 [P.O.:710] Out: 1000 [Urine:1000] Intake/Output this shift: Total I/O In: -  Out: 750 [Urine:750]   Basename 08/04/11 0714 08/03/11 0500  HGB 10.7* 12.0*    Basename 08/04/11 0714 08/03/11 0500  WBC 16.6* 12.3*  RBC 3.35* 3.82*  HCT 31.4* 35.8*  PLT 182 197    Basename 08/03/11 0500  NA 140  K 4.4  CL 103  CO2 28  BUN 24*  CREATININE 1.27  GLUCOSE 143*  CALCIUM 8.3*   No results found for this basename: LABPT:2,INR:2 in the last 72 hours  Sensation intact distally Intact pulses distally Dorsiflexion/Plantar flexion intact Incision: no drainage  Assessment/Plan: 3 Days Post-Op Procedure(s) (LRB): COMPUTER ASSISTED TOTAL KNEE ARTHROPLASTY (Right) Discharge home with home health  [name] 08/05/2011, 6:53 AM

## 2011-08-05 NOTE — Progress Notes (Signed)
Patient [name], 66 year old [place] American male suffers with leg issues.  He enjoys the emotional support of his wife and daughter.  Patient expressed appreciation for Chaplain's provision of pastoral prayer, presence, and conversation.  No follow-up needed as patient expects to be discharged later today.

## 2011-08-17 ENCOUNTER — Other Ambulatory Visit: Payer: Self-pay | Admitting: Internal Medicine

## 2011-08-25 ENCOUNTER — Ambulatory Visit: Payer: Medicare Other | Admitting: Gastroenterology

## 2011-09-22 ENCOUNTER — Ambulatory Visit (INDEPENDENT_AMBULATORY_CARE_PROVIDER_SITE_OTHER): Payer: Medicare Other | Admitting: Gastroenterology

## 2011-09-22 DIAGNOSIS — B182 Chronic viral hepatitis C: Secondary | ICD-10-CM

## 2011-09-22 NOTE — Patient Instructions (Addendum)
Our phone number at [facility] is [phone] To get a medical record number at [facility] you will need to call [phone] option 3 Reduce the dose of the cholestyramine powder to twice a day to see if it helps with constipation

## 2011-09-23 LAB — CBC WITH DIFFERENTIAL/PLATELET
Basophils Absolute: 0 10*3/uL (ref 0.0–0.1)
Basophils Relative: 0 % (ref 0–1)
HCT: 39.9 % (ref 39.0–52.0)
MCHC: 34.8 g/dL (ref 30.0–36.0)
Monocytes Absolute: 0.8 10*3/uL (ref 0.1–1.0)
Neutro Abs: 5.9 10*3/uL (ref 1.7–7.7)
RDW: 13.6 % (ref 11.5–15.5)

## 2011-09-23 LAB — COMPLETE METABOLIC PANEL WITH GFR
AST: 23 U/L (ref 0–37)
Alkaline Phosphatase: 114 U/L (ref 39–117)
BUN: 20 mg/dL (ref 6–23)
Creat: 1.08 mg/dL (ref 0.50–1.35)
Potassium: 4.4 mEq/L (ref 3.5–5.3)
Total Bilirubin: 0.6 mg/dL (ref 0.3–1.2)

## 2011-09-23 LAB — PROTIME-INR
INR: 0.98 (ref <1.50)
Prothrombin Time: 13.4 seconds (ref 11.6–15.2)

## 2011-09-23 LAB — AFP TUMOR MARKER: AFP-Tumor Marker: 6 ng/mL (ref 0.0–8.0)

## 2011-09-27 ENCOUNTER — Other Ambulatory Visit: Payer: Self-pay | Admitting: *Deleted

## 2011-09-27 MED ORDER — LISINOPRIL 5 MG PO TABS: 5.0000 mg | ORAL_TABLET | Freq: Every day | ORAL | Status: DC

## 2011-09-30 ENCOUNTER — Encounter (HOSPITAL_COMMUNITY): Payer: Self-pay | Admitting: Emergency Medicine

## 2011-09-30 ENCOUNTER — Inpatient Hospital Stay (HOSPITAL_COMMUNITY)
Admission: EM | Admit: 2011-09-30 | Discharge: 2011-10-05 | DRG: 488 | Disposition: A | Discharge status: Home / Self Care | Payer: Medicare Other | Attending: Orthopaedic Surgery | Admitting: Orthopaedic Surgery

## 2011-09-30 ENCOUNTER — Emergency Department (HOSPITAL_COMMUNITY): Payer: Medicare Other

## 2011-09-30 DIAGNOSIS — M009 Pyogenic arthritis, unspecified: Secondary | ICD-10-CM

## 2011-09-30 DIAGNOSIS — J449 Chronic obstructive pulmonary disease, unspecified: Secondary | ICD-10-CM | POA: Diagnosis present

## 2011-09-30 DIAGNOSIS — M25462 Effusion, left knee: Secondary | ICD-10-CM | POA: Diagnosis present

## 2011-09-30 DIAGNOSIS — F329 Major depressive disorder, single episode, unspecified: Secondary | ICD-10-CM | POA: Diagnosis present

## 2011-09-30 DIAGNOSIS — Z96659 Presence of unspecified artificial knee joint: Secondary | ICD-10-CM

## 2011-09-30 DIAGNOSIS — M25469 Effusion, unspecified knee: Principal | ICD-10-CM | POA: Diagnosis present

## 2011-09-30 DIAGNOSIS — Z882 Allergy status to sulfonamides status: Secondary | ICD-10-CM

## 2011-09-30 DIAGNOSIS — F3289 Other specified depressive episodes: Secondary | ICD-10-CM | POA: Diagnosis present

## 2011-09-30 DIAGNOSIS — F17200 Nicotine dependence, unspecified, uncomplicated: Secondary | ICD-10-CM | POA: Diagnosis present

## 2011-09-30 DIAGNOSIS — Z880 Allergy status to penicillin: Secondary | ICD-10-CM

## 2011-09-30 DIAGNOSIS — F411 Generalized anxiety disorder: Secondary | ICD-10-CM | POA: Diagnosis present

## 2011-09-30 DIAGNOSIS — I10 Essential (primary) hypertension: Secondary | ICD-10-CM | POA: Diagnosis present

## 2011-09-30 DIAGNOSIS — E785 Hyperlipidemia, unspecified: Secondary | ICD-10-CM | POA: Diagnosis present

## 2011-09-30 DIAGNOSIS — M1711 Unilateral primary osteoarthritis, right knee: Secondary | ICD-10-CM

## 2011-09-30 DIAGNOSIS — Z823 Family history of stroke: Secondary | ICD-10-CM

## 2011-09-30 DIAGNOSIS — J4489 Other specified chronic obstructive pulmonary disease: Secondary | ICD-10-CM | POA: Diagnosis present

## 2011-09-30 DIAGNOSIS — K219 Gastro-esophageal reflux disease without esophagitis: Secondary | ICD-10-CM | POA: Diagnosis present

## 2011-09-30 DIAGNOSIS — F341 Dysthymic disorder: Secondary | ICD-10-CM | POA: Diagnosis present

## 2011-09-30 DIAGNOSIS — M2500 Hemarthrosis, unspecified joint: Secondary | ICD-10-CM

## 2011-09-30 DIAGNOSIS — H9190 Unspecified hearing loss, unspecified ear: Secondary | ICD-10-CM | POA: Diagnosis present

## 2011-09-30 DIAGNOSIS — B1920 Unspecified viral hepatitis C without hepatic coma: Secondary | ICD-10-CM | POA: Diagnosis present

## 2011-09-30 DIAGNOSIS — J189 Pneumonia, unspecified organism: Secondary | ICD-10-CM | POA: Diagnosis present

## 2011-09-30 HISTORY — DX: Unspecified hearing loss, unspecified ear: H91.90

## 2011-09-30 HISTORY — DX: Unspecified cataract: H26.9

## 2011-09-30 HISTORY — DX: Unspecified glaucoma: H40.9

## 2011-09-30 NOTE — ED Notes (Signed)
Per EMS pt was out in the yard doing yard work today and woke tonight with left knee swollen and hot to touch  Pedal pulse present  Pt unable to ambulate this evening  Pt denies known injury  Pt was rating pain 10/10 upon EMS arrival  Pt was given Fentanyl prior to arrival in divided doses of every 5 minutes  IV in left AC #20 by EMS  Pt rating pain 6/10 on arrival to hospital

## 2011-09-30 NOTE — ED Notes (Signed)
ZOX:[id number]<AV> Expected date:09/30/11<BR> Expected time:11:11 PM<BR> Means of arrival:Ambulance<BR> Comments:<BR> Knee pain

## 2011-10-01 ENCOUNTER — Encounter (HOSPITAL_COMMUNITY): Payer: Self-pay | Admitting: *Deleted

## 2011-10-01 ENCOUNTER — Encounter (HOSPITAL_COMMUNITY): Payer: Self-pay | Admitting: Registered Nurse

## 2011-10-01 ENCOUNTER — Emergency Department (HOSPITAL_COMMUNITY): Payer: Medicare Other

## 2011-10-01 ENCOUNTER — Encounter (HOSPITAL_COMMUNITY)
Admission: EM | Disposition: A | Discharge status: Home / Self Care | Payer: Self-pay | Source: Home / Self Care | Attending: Orthopaedic Surgery

## 2011-10-01 ENCOUNTER — Emergency Department (HOSPITAL_COMMUNITY): Payer: Medicare Other | Admitting: Registered Nurse

## 2011-10-01 DIAGNOSIS — J449 Chronic obstructive pulmonary disease, unspecified: Secondary | ICD-10-CM

## 2011-10-01 DIAGNOSIS — J189 Pneumonia, unspecified organism: Secondary | ICD-10-CM | POA: Diagnosis present

## 2011-10-01 DIAGNOSIS — M25462 Effusion, left knee: Secondary | ICD-10-CM | POA: Diagnosis present

## 2011-10-01 HISTORY — PX: KNEE ARTHROSCOPY: SHX127

## 2011-10-01 LAB — CBC WITH DIFFERENTIAL/PLATELET
Basophils Absolute: 0 10*3/uL (ref 0.0–0.1)
Basophils Relative: 0 % (ref 0–1)
Eosinophils Absolute: 0 10*3/uL (ref 0.0–0.7)
HCT: 39.2 % (ref 39.0–52.0)
Lymphs Abs: 1.8 10*3/uL (ref 0.7–4.0)
MCHC: 34.7 g/dL (ref 30.0–36.0)
MCV: 89.5 fL (ref 78.0–100.0)
Monocytes Relative: 11 % (ref 3–12)
Neutro Abs: 11.5 10*3/uL — ABNORMAL HIGH (ref 1.7–7.7)
RDW: 13.3 % (ref 11.5–15.5)

## 2011-10-01 LAB — BASIC METABOLIC PANEL
BUN: 15 mg/dL (ref 6–23)
Chloride: 97 mEq/L (ref 96–112)
Creatinine, Ser: 0.95 mg/dL (ref 0.50–1.35)
GFR calc Af Amer: >90 mL/min (ref >90)
Glucose, Bld: 153 mg/dL — ABNORMAL HIGH (ref 70–99)

## 2011-10-01 LAB — MRSA PCR SCREENING: MRSA by PCR: NEGATIVE

## 2011-10-01 LAB — URINE MICROSCOPIC-ADD ON

## 2011-10-01 LAB — SYNOVIAL CELL COUNT + DIFF, W/ CRYSTALS
Lymphocytes-Synovial Fld: 1 % (ref 0–20)
Monocyte-Macrophage-Synovial Fluid: 3 % — ABNORMAL LOW (ref 50–90)

## 2011-10-01 LAB — GRAM STAIN

## 2011-10-01 LAB — URINALYSIS, ROUTINE W REFLEX MICROSCOPIC
Bilirubin Urine: NEGATIVE
Hgb urine dipstick: NEGATIVE
Ketones, ur: NEGATIVE mg/dL
Nitrite: NEGATIVE
pH: 5 (ref 5.0–8.0)

## 2011-10-01 SURGERY — ARTHROSCOPY, KNEE
Anesthesia: General | Site: Knee | Laterality: Left | Wound class: Contaminated

## 2011-10-01 MED ORDER — ONDANSETRON HCL 4 MG PO TABS: 4.0000 mg | ORAL_TABLET | Freq: Four times a day (QID) | ORAL | Status: DC | PRN

## 2011-10-01 MED ORDER — HYDROMORPHONE HCL PF 1 MG/ML IJ SOLN
0.5000 mg | Freq: Once | INTRAMUSCULAR | Status: AC
  Administered 2011-10-01: 0.5 mg via INTRAVENOUS
  Filled 2011-10-01: qty 1

## 2011-10-01 MED ORDER — LACTATED RINGERS IV SOLN
INTRAVENOUS | Status: DC | PRN
  Administered 2011-10-01: 09:00:00 via INTRAVENOUS

## 2011-10-01 MED ORDER — FLUTICASONE-SALMETEROL 250-50 MCG/DOSE IN AEPB
1.0000 | INHALATION_SPRAY | Freq: Two times a day (BID) | RESPIRATORY_TRACT | Status: DC
  Administered 2011-10-01 – 2011-10-05 (×7): 1 via RESPIRATORY_TRACT
  Filled 2011-10-01: qty 14

## 2011-10-01 MED ORDER — CITALOPRAM HYDROBROMIDE 20 MG PO TABS
20.0000 mg | ORAL_TABLET | Freq: Every day | ORAL | Status: DC
  Administered 2011-10-01 – 2011-10-05 (×5): 20 mg via ORAL
  Filled 2011-10-01 (×5): qty 1

## 2011-10-01 MED ORDER — VANCOMYCIN HCL IN DEXTROSE 1-5 GM/200ML-% IV SOLN
INTRAVENOUS | Status: AC
  Filled 2011-10-01: qty 200

## 2011-10-01 MED ORDER — ZOLPIDEM TARTRATE 5 MG PO TABS: 5.0000 mg | ORAL_TABLET | Freq: Every evening | ORAL | Status: DC | PRN

## 2011-10-01 MED ORDER — PANTOPRAZOLE SODIUM 40 MG PO TBEC
40.0000 mg | DELAYED_RELEASE_TABLET | Freq: Every day | ORAL | Status: DC
  Administered 2011-10-01 – 2011-10-05 (×5): 40 mg via ORAL
  Filled 2011-10-01 (×6): qty 1

## 2011-10-01 MED ORDER — METOCLOPRAMIDE HCL 5 MG PO TABS
5.0000 mg | ORAL_TABLET | Freq: Three times a day (TID) | ORAL | Status: DC | PRN
  Filled 2011-10-01: qty 2

## 2011-10-01 MED ORDER — LISINOPRIL 5 MG PO TABS
5.0000 mg | ORAL_TABLET | Freq: Every day | ORAL | Status: DC
  Administered 2011-10-01 – 2011-10-03 (×3): 5 mg via ORAL
  Filled 2011-10-01 (×3): qty 1

## 2011-10-01 MED ORDER — DIPHENHYDRAMINE HCL 12.5 MG/5ML PO ELIX: 12.5000 mg | ORAL_SOLUTION | ORAL | Status: DC | PRN

## 2011-10-01 MED ORDER — FENTANYL CITRATE 0.05 MG/ML IJ SOLN
25.0000 ug | INTRAMUSCULAR | Status: DC | PRN
  Administered 2011-10-01 (×2): 50 ug via INTRAVENOUS

## 2011-10-01 MED ORDER — MORPHINE SULFATE 2 MG/ML IJ SOLN
INTRAMUSCULAR | Status: AC
  Administered 2011-10-01: 1 mg via INTRAVENOUS
  Filled 2011-10-01: qty 1

## 2011-10-01 MED ORDER — PIPERACILLIN-TAZOBACTAM 3.375 G IVPB
3.3750 g | Freq: Three times a day (TID) | INTRAVENOUS | Status: DC
  Filled 2011-10-01 (×2): qty 50

## 2011-10-01 MED ORDER — ALBUTEROL SULFATE HFA 108 (90 BASE) MCG/ACT IN AERS
2.0000 | INHALATION_SPRAY | RESPIRATORY_TRACT | Status: DC | PRN
  Filled 2011-10-01: qty 6.7

## 2011-10-01 MED ORDER — MORPHINE SULFATE 2 MG/ML IJ SOLN
1.0000 mg | INTRAMUSCULAR | Status: DC | PRN
  Administered 2011-10-01 – 2011-10-03 (×6): 1 mg via INTRAVENOUS
  Filled 2011-10-01 (×4): qty 1

## 2011-10-01 MED ORDER — METOCLOPRAMIDE HCL 5 MG/ML IJ SOLN: 5.0000 mg | Freq: Three times a day (TID) | INTRAMUSCULAR | Status: DC | PRN

## 2011-10-01 MED ORDER — HYDROMORPHONE HCL PF 1 MG/ML IJ SOLN
1.0000 mg | Freq: Once | INTRAMUSCULAR | Status: AC
  Administered 2011-10-01: 1 mg via INTRAVENOUS
  Filled 2011-10-01: qty 1

## 2011-10-01 MED ORDER — HYDROMORPHONE HCL PF 1 MG/ML IJ SOLN
1.0000 mg | Freq: Once | INTRAMUSCULAR | Status: AC
  Administered 2011-10-01: 1 mg via INTRAVENOUS

## 2011-10-01 MED ORDER — LACTATED RINGERS IV SOLN: INTRAVENOUS | Status: DC

## 2011-10-01 MED ORDER — VANCOMYCIN HCL IN DEXTROSE 1-5 GM/200ML-% IV SOLN
1000.0000 mg | Freq: Two times a day (BID) | INTRAVENOUS | Status: DC
  Administered 2011-10-01 – 2011-10-04 (×5): 1000 mg via INTRAVENOUS
  Filled 2011-10-01 (×5): qty 200

## 2011-10-01 MED ORDER — PHENYLEPHRINE HCL 10 MG/ML IJ SOLN
INTRAMUSCULAR | Status: DC | PRN
  Administered 2011-10-01 (×2): 80 ug via INTRAVENOUS

## 2011-10-01 MED ORDER — DEXTROSE 5 % IV SOLN
500.0000 mg | Freq: Four times a day (QID) | INTRAVENOUS | Status: DC | PRN
  Filled 2011-10-01: qty 5

## 2011-10-01 MED ORDER — LATANOPROST 0.005 % OP SOLN
1.0000 [drp] | Freq: Every day | OPHTHALMIC | Status: DC
  Administered 2011-10-01 – 2011-10-04 (×4): 1 [drp] via OPHTHALMIC
  Filled 2011-10-01: qty 2.5

## 2011-10-01 MED ORDER — SODIUM CHLORIDE 0.9 % IV SOLN
INTRAVENOUS | Status: DC
  Administered 2011-10-01 – 2011-10-02 (×3): via INTRAVENOUS
  Administered 2011-10-02: 75 mL/h via INTRAVENOUS
  Administered 2011-10-03 – 2011-10-04 (×2): via INTRAVENOUS

## 2011-10-01 MED ORDER — TIOTROPIUM BROMIDE MONOHYDRATE 18 MCG IN CAPS
18.0000 ug | ORAL_CAPSULE | Freq: Every day | RESPIRATORY_TRACT | Status: DC
Start: 2011-10-01 — End: 2011-10-05
  Administered 2011-10-01 – 2011-10-05 (×4): 18 ug via RESPIRATORY_TRACT
  Filled 2011-10-01: qty 5

## 2011-10-01 MED ORDER — ONDANSETRON HCL 4 MG/2ML IJ SOLN: 4.0000 mg | Freq: Four times a day (QID) | INTRAMUSCULAR | Status: DC | PRN

## 2011-10-01 MED ORDER — ONDANSETRON HCL 4 MG/2ML IJ SOLN
INTRAMUSCULAR | Status: DC | PRN
  Administered 2011-10-01: 4 mg via INTRAVENOUS

## 2011-10-01 MED ORDER — DOCUSATE SODIUM 100 MG PO CAPS
100.0000 mg | ORAL_CAPSULE | Freq: Two times a day (BID) | ORAL | Status: DC
  Administered 2011-10-01 – 2011-10-05 (×9): 100 mg via ORAL
  Filled 2011-10-01 (×4): qty 1

## 2011-10-01 MED ORDER — LIDOCAINE HCL (CARDIAC) 20 MG/ML IV SOLN
INTRAVENOUS | Status: DC | PRN
  Administered 2011-10-01: 30 mg via INTRAVENOUS

## 2011-10-01 MED ORDER — PROPOFOL 10 MG/ML IV BOLUS
INTRAVENOUS | Status: DC | PRN
  Administered 2011-10-01: 200 mg via INTRAVENOUS

## 2011-10-01 MED ORDER — FENTANYL CITRATE 0.05 MG/ML IJ SOLN
INTRAMUSCULAR | Status: DC | PRN
  Administered 2011-10-01: 100 ug via INTRAVENOUS
  Administered 2011-10-01 (×3): 50 ug via INTRAVENOUS

## 2011-10-01 MED ORDER — SODIUM CHLORIDE 0.9 % IR SOLN
Status: DC | PRN
  Administered 2011-10-01 (×2): 3000 mL

## 2011-10-01 MED ORDER — HYDROMORPHONE HCL PF 1 MG/ML IJ SOLN
INTRAMUSCULAR | Status: DC | PRN
  Administered 2011-10-01 (×2): 1 mg via INTRAVENOUS

## 2011-10-01 MED ORDER — LEVOFLOXACIN IN D5W 750 MG/150ML IV SOLN
750.0000 mg | INTRAVENOUS | Status: DC
  Administered 2011-10-01 – 2011-10-04 (×4): 750 mg via INTRAVENOUS
  Filled 2011-10-01 (×5): qty 150

## 2011-10-01 MED ORDER — HYDROMORPHONE HCL PF 1 MG/ML IJ SOLN
1.0000 mg | Freq: Once | INTRAMUSCULAR | Status: DC
  Filled 2011-10-01: qty 1

## 2011-10-01 MED ORDER — OXYCODONE HCL 5 MG PO TABS
5.0000 mg | ORAL_TABLET | ORAL | Status: DC | PRN
  Administered 2011-10-01: 5 mg via ORAL
  Administered 2011-10-01 – 2011-10-05 (×11): 10 mg via ORAL
  Filled 2011-10-01 (×11): qty 2
  Filled 2011-10-01: qty 1
  Filled 2011-10-01: qty 2

## 2011-10-01 MED ORDER — FENTANYL CITRATE 0.05 MG/ML IJ SOLN
INTRAMUSCULAR | Status: AC
  Filled 2011-10-01: qty 2

## 2011-10-01 MED ORDER — HYDROCODONE-ACETAMINOPHEN 5-325 MG PO TABS
1.0000 | ORAL_TABLET | ORAL | Status: DC | PRN
  Administered 2011-10-01 – 2011-10-02 (×4): 2 via ORAL
  Filled 2011-10-01 (×5): qty 2

## 2011-10-01 MED ORDER — HYDROMORPHONE HCL PF 1 MG/ML IJ SOLN
0.5000 mg | Freq: Once | INTRAMUSCULAR | Status: AC
  Administered 2011-10-01: 0.5 mg via INTRAVENOUS

## 2011-10-01 MED ORDER — VANCOMYCIN HCL 1000 MG IV SOLR
1000.0000 mg | INTRAVENOUS | Status: DC | PRN
  Administered 2011-10-01: 1000 mg via INTRAVENOUS

## 2011-10-01 MED ORDER — SODIUM CHLORIDE 0.9 % IV SOLN
1500.0000 mg | Freq: Once | INTRAVENOUS | Status: AC
  Administered 2011-10-01: 1500 mg via INTRAVENOUS
  Filled 2011-10-01: qty 1500

## 2011-10-01 MED ORDER — METHOCARBAMOL 500 MG PO TABS
500.0000 mg | ORAL_TABLET | Freq: Four times a day (QID) | ORAL | Status: DC | PRN
  Administered 2011-10-01 – 2011-10-05 (×6): 500 mg via ORAL
  Filled 2011-10-01 (×6): qty 1

## 2011-10-01 MED ORDER — CHOLESTYRAMINE 4 G PO PACK
1.0000 | PACK | Freq: Two times a day (BID) | ORAL | Status: DC
  Administered 2011-10-01 – 2011-10-05 (×8): 1 via ORAL
  Filled 2011-10-01 (×10): qty 1

## 2011-10-01 SURGICAL SUPPLY — 28 items
BANDAGE ELASTIC 6 VELCRO ST LF (GAUZE/BANDAGES/DRESSINGS) ×2 IMPLANT
BANDAGE GAUZE ELAST BULKY 4 IN (GAUZE/BANDAGES/DRESSINGS) ×2 IMPLANT
BLADE CUDA GRT WHITE 3.5 (BLADE) ×2 IMPLANT
BLADE CUDA SHAVER 3.5 (BLADE) ×2 IMPLANT
CLOTH BEACON ORANGE TIMEOUT ST (SAFETY) ×2 IMPLANT
CUFF TOURN SGL QUICK 34 (TOURNIQUET CUFF)
CUFF TRNQT CYL 34X4X40X1 (TOURNIQUET CUFF) IMPLANT
DRAPE U-SHAPE 47X51 STRL (DRAPES) ×2 IMPLANT
DRSG PAD ABDOMINAL 8X10 ST (GAUZE/BANDAGES/DRESSINGS) ×2 IMPLANT
DURAPREP 26ML APPLICATOR (WOUND CARE) ×2 IMPLANT
GAUZE XEROFORM 1X8 LF (GAUZE/BANDAGES/DRESSINGS) ×2 IMPLANT
GLOVE BIOGEL PI IND STRL 8 (GLOVE) ×1 IMPLANT
GLOVE BIOGEL PI INDICATOR 8 (GLOVE) ×1
GLOVE ORTHO TXT STRL SZ7.5 (GLOVE) ×2 IMPLANT
GOWN STRL REIN XL XLG (GOWN DISPOSABLE) ×2 IMPLANT
IV LACTATED RINGER IRRG 3000ML (IV SOLUTION) ×4
IV LR IRRIG 3000ML ARTHROMATIC (IV SOLUTION) ×4 IMPLANT
MANIFOLD NEPTUNE II (INSTRUMENTS) ×2 IMPLANT
PACK ARTHROSCOPY WL (CUSTOM PROCEDURE TRAY) ×2 IMPLANT
PADDING CAST COTTON 6X4 STRL (CAST SUPPLIES) ×2 IMPLANT
SUT ETHILON 4 0 PS 2 18 (SUTURE) ×2 IMPLANT
SWAB COLLECTION DEVICE MRSA (MISCELLANEOUS) ×2 IMPLANT
SYR 20CC LL (SYRINGE) ×2 IMPLANT
SYR CONTROL 10ML LL (SYRINGE) ×2 IMPLANT
TOWEL OR 17X26 10 PK STRL BLUE (TOWEL DISPOSABLE) ×4 IMPLANT
TOWEL OR NON WOVEN STRL DISP B (DISPOSABLE) ×2 IMPLANT
WAND 90 DEG TURBOVAC W/CORD (SURGICAL WAND) IMPLANT
WRAP KNEE MAXI GEL POST OP (GAUZE/BANDAGES/DRESSINGS) ×2 IMPLANT

## 2011-10-01 NOTE — Anesthesia Postprocedure Evaluation (Signed)
  Anesthesia Post-op Note  Patient: [name]  Procedure(s) Performed: Procedure(s) (LRB): ARTHROSCOPY KNEE (Left)  Patient Location: PACU  Anesthesia Type: General  Level of Consciousness: awake and alert   Airway and Oxygen Therapy: Patient Spontanous Breathing  Post-op Pain: mild  Post-op Assessment: Post-op Vital signs reviewed, Patient's Cardiovascular Status Stable, Respiratory Function Stable, Patent Airway and No signs of Nausea or vomiting  Post-op Vital Signs: stable  Complications: No apparent anesthesia complications

## 2011-10-01 NOTE — ED Notes (Signed)
Pt medicated for pain per MD order. Family at bedside.

## 2011-10-01 NOTE — ED Provider Notes (Signed)
History     CSN: [id number]  Arrival date & time 09/30/11  2315   First MD Initiated Contact with Patient 09/30/11 2356      Chief Complaint  Patient presents with  . Knee Pain    (Consider location/radiation/quality/duration/timing/severity/associated sxs/prior treatment) HPI History provided by pt.   Pt developed pain in left knee yesterday afternoon while mowing the lawn.  Does not recall whether or not there was any trauma to knee.  Pain gradually worsening and is currently severe.  Aggravated by minimal ROM and bearing weight.  No associated skin changes or fever.  No h/o same.  Denies having any other arthralgias nor paresthesias.  Pt is not anti-coagulated.   Past Medical History  Diagnosis Date  . Abnormal liver function tests 01/2010     on hospital admission February 23, 2010 AP = 36, AST = 62, ALT = 40, GGT = 392  . COPD (chronic obstructive pulmonary disease)   . Hypertension   . Hyperlipidemia   . GERD (gastroesophageal reflux disease)   . Anxiety   . Depression   . Pruritus 06/2009     this is most likely secondary to cholestasis, last bilirubin 2.7 (02-25-2010)  . Hepatitis C 2012     a hepatitis panel done February 19, 1998 showed anti-HBc +, anti-HBV +, hep C ab reactive, February 23, 2010 hepatitis B DNA not detected, HCV RNA  VL = 444000  . Shortness of breath     with exereize  . Cataract   . Glaucoma   . HOH (hard of hearing)     Past Surgical History  Procedure Date  . Open anterior shoulder reconstruction 2003     surgery performed June 26, 2001 by Dr. Rennis Chris, open right shoulder anterior reconstruction secondary to recurrent right shoulder anterior inferior dislocation  . Knee arthroplasty 08/02/2011    Procedure: COMPUTER ASSISTED TOTAL KNEE ARTHROPLASTY;  Surgeon: [name], MD;  Location: [facility] OR;  Service: Orthopedics;  Laterality: Right;  Right total knee arthroplasty    Family History  Problem Relation Age of Onset  . Stroke Neg Hx   .  Cancer Neg Hx     History  Substance Use Topics  . Smoking status: Current Everyday Smoker -- 1.5 packs/day for 49 years    Types: Cigarettes  . Smokeless tobacco: Not on file  . Alcohol Use: No      Review of Systems  All other systems reviewed and are negative.    Allergies  Celecoxib; Penicillins; Sulfa antibiotics; and Sulfonamide derivatives  Home Medications   Current Outpatient Rx  Name Route Sig Dispense Refill  . ASPIRIN 81 MG PO CHEW Oral Chew 81 mg by mouth daily.    . CHOLESTYRAMINE 4 G PO PACK Oral Take 1 packet by mouth 2 (two) times daily with a meal.     . CITALOPRAM HYDROBROMIDE 20 MG PO TABS Oral Take 20 mg by mouth daily.    Marland Kitchen FLUTICASONE-SALMETEROL 250-50 MCG/DOSE IN AEPB Inhalation Inhale 1 puff into the lungs 2 (two) times daily. 60 each 5  . LATANOPROST 0.005 % OP SOLN Both Eyes Place 1 drop into both eyes at bedtime.    Marland Kitchen LISINOPRIL 5 MG PO TABS Oral Take 1 tablet (5 mg total) by mouth daily. 30 tablet 11  . OMEPRAZOLE 40 MG PO CPDR Oral Take 40 mg by mouth daily.    . OXYCODONE HCL 5 MG PO TABS Oral Take 5-10 mg by mouth every 6 (six)  hours as needed. For pain    . TIOTROPIUM BROMIDE MONOHYDRATE 18 MCG IN CAPS Inhalation Place 1 capsule (18 mcg total) into inhaler and inhale daily. 30 capsule 5  . ALBUTEROL SULFATE HFA 108 (90 BASE) MCG/ACT IN AERS Inhalation Inhale 2 puffs into the lungs every 4 (four) hours as needed. For shortness of breath or wheezing. 1 Inhaler 5    BP 178/71  Temp 98.3 F (36.8 C) (Oral)  Resp 22  SpO2 97%  Physical Exam  Nursing note and vitals reviewed. Constitutional: He is oriented to person, place, and time. He appears well-developed and well-nourished. No distress.  HENT:  Head: Normocephalic and atraumatic.  Eyes:       Normal appearance  Neck: Normal range of motion.  Pulmonary/Chest: Effort normal.  Musculoskeletal: Normal range of motion.       Right knee w/ vertical surgical scar.  Left knee edematous  compared to right.  No erythema, ecchymosis or warmth.  No tenderness or edema of calf.  Pain w/ minimal passive flexion of knee.  Distal N/V intact.   Neurological: He is alert and oriented to person, place, and time.  Psychiatric: He has a normal mood and affect. His behavior is normal.    ED Course  ARTHOCENTESIS Date/Time: 10/01/2011 3:18 AM Performed by: Otilio Miu Authorized by: Ruby Cola E Consent: Verbal consent obtained. Risks and benefits: risks, benefits and alternatives were discussed Consent given by: patient Patient understanding: patient states understanding of the procedure being performed Patient identity confirmed: verbally with patient Indications: joint swelling, pain and possible septic joint  Body area: knee Joint: left knee Local anesthesia used: yes Anesthesia: local infiltration Local anesthetic: lidocaine 2% without epinephrine Anesthetic total: 10 ml Patient sedated: no Preparation: Patient was prepped and draped in the usual sterile fashion. Needle gauge: 18 G Approach: medial Aspirate: bloody Aspirate amount: 100 ml Patient tolerance: Patient tolerated the procedure well with no immediate complications.   (including critical care time)   Labs Reviewed  CELL COUNT + DIFF,  W/ CRYST-SYNVL FLD - Abnormal; Notable for the following:    Color, Synovial RED (*)     Appearance-Synovial TURBID (*)     WBC, Synovial 78469 (*)     Neutrophil, Synovial 96 (*)     Monocyte-Macrophage-Synovial Fluid 3 (*)     All other components within normal limits  CBC WITH DIFFERENTIAL - Abnormal; Notable for the following:    WBC 14.9 (*)     Neutro Abs 11.5 (*)     Monocytes Absolute 1.6 (*)     All other components within normal limits  BASIC METABOLIC PANEL - Abnormal; Notable for the following:    Sodium 134 (*)     Glucose, Bld 153 (*)     GFR calc non Af Amer 85 (*)     All other components within normal limits  BODY FLUID CULTURE    C-REACTIVE PROTEIN  URINALYSIS, ROUTINE W REFLEX MICROSCOPIC   Dg Knee Complete 4 Views Left  10/01/2011  *RADIOLOGY REPORT*  Clinical Data: Severe knee pain and swelling.  No known injury.  LEFT KNEE - COMPLETE 4+ VIEW  Comparison: 07/01/2007  Findings: A moderate to large knee joint effusion is seen which is increased since previous study. Moderate tricompartmental osteoarthritis is seen, without significant interval change.  No other bone lesions are identified.  IMPRESSION:  1.  Increased size of moderate to large knee joint effusion. 2.  Moderate tricompartmental osteoarthritis, without significant change.  Original Report Authenticated By: Danae Orleans, M.D.      1. Hemarthrosis       MDM  [phone] M presents w/ non-traumatic L knee edema and severe pain.  Exam sig for edema and pain w/ minimal passive.  Afebrile.  Arthrocentesis performed and synovial fluid bloody w/ 76,000.  Pt has a serum leukocytosis as well.  Pain improved w/ IV dilaudid.  Dr. Dierdre Highman has spoke w/ orthopedics who will consult for spontaneous hemarthrosis.         Otilio Miu, [place] 10/01/11 [phone]

## 2011-10-01 NOTE — ED Provider Notes (Signed)
Medical screening examination/treatment/procedure(s) were conducted as a shared visit with non-physician practitioner(s) and myself.  I personally evaluated the patient during the encounter. Subacute L knee effusion, 75K WBCs and hemarthrosis. Ortho eval and admit  Sunnie Nielsen, MD 10/01/11 [phone]

## 2011-10-01 NOTE — Brief Op Note (Signed)
09/30/2011 - 10/01/2011  10:45 AM  PATIENT:  [name]  65 y.o. male  PRE-OPERATIVE DIAGNOSIS:  infected left knee  POST-OPERATIVE DIAGNOSIS:  infected left knee  PROCEDURE:  Procedure(s) (LRB) with comments: ARTHROSCOPY KNEE (Left) - with synevectomy  SURGEON:  Surgeon(s) and Role:    * [name], MD - Primary  PHYSICIAN ASSISTANT:   ASSISTANTS: none   ANESTHESIA:   general  EBL:   minimal  BLOOD ADMINISTERED:none  DRAINS: none   LOCAL MEDICATIONS USED:  NONE  SPECIMEN:  Aspirate  DISPOSITION OF SPECIMEN:  N/A  COUNTS:  YES  TOURNIQUET:  * No tourniquets in log *  DICTATION: .Other Dictation: Dictation Number [id number]  PLAN OF CARE: Admit to inpatient   PATIENT DISPOSITION:  PACU - hemodynamically stable.   Delay start of Pharmacological VTE agent (>24hrs) due to surgical blood loss or risk of bleeding: no

## 2011-10-01 NOTE — ED Provider Notes (Signed)
Medical screening examination/treatment/procedure(s) were conducted as a shared visit with non-physician practitioner(s) and myself.  I personally evaluated the patient during the encounter. L knee TTP, mod effusion confirmed on xray. arthrocentesis shows inc WBCs. Ortho c/s for Ed eval.   Sunnie Nielsen, MD 10/01/11 2328

## 2011-10-01 NOTE — Transfer of Care (Addendum)
Immediate Anesthesia Transfer of Care Note  Patient: [name]  Procedure(s) Performed: Procedure(s) (LRB) with comments: ARTHROSCOPY KNEE (Left) - with synevectomy  Patient Location: PACU  Anesthesia Type: General  Level of Consciousness: sedated  Airway & Oxygen Therapy: Patient Spontanous Breathing and Patient connected to face mask oxygen  Post-op Assessment: Report given to PACU RN, Post -op Vital signs reviewed and stable and Patient moving all extremities X 4  Post vital signs: stable  Complications: No apparent anesthesia complications

## 2011-10-01 NOTE — ED Notes (Signed)
Pts pain under control at this time, family advised pt can have pain addt pain medication if her requires. Advised to use call bell.

## 2011-10-01 NOTE — ED Notes (Signed)
Expiratory wheezing noted to lungs. Diminished bilateral lower bases.  PA informed.  Pt appears to have increased effort with resp.  Pt reports using Advair and Spiriva at home.  PA informed.  PA at bedside to assess patient and determine plan of care. Pt given a pillow.

## 2011-10-01 NOTE — Consult Note (Addendum)
[facility] Medical Consultation  [name] ZOX:[id number] DOB: 02-Jun-1945 DOA: 09/30/2011 PCP: Lorretta Harp, MD   Requesting physician: Dr. Magnus Ivan (surgery) Date of consultation: 10/01/2011 Reason for consultation: possible pneumonia  Impression/Recommendations  Active Problems: Community acquired pneumonia - based on CXR findings left lung base possible pneumonia - would add levaquin to the current antibiotic regimen - follow up labs associated with pneumonia order sets  Knee effusion, left - management per primary team - agree with vancomycin  Manson Passey [facility] Pager [phone]  If 7PM-7AM, please contact night-coverage [email or web address] Password [facility] 10/01/2011, 10:59 AM  I will followup again tomorrow. Please contact me if I can be of assistance in the meanwhile. Thank you for this consultation.  HPI:  66 year old male with multiple medical comorbidities presented with worsening pain in left knee and found to have effusion, possible septic arthritis now status post left knee arthroscopy. Medicine consulted for possible pneumonia. Patient reports having occasional shortness of breath, cough productive of whitish sputum but no fevers or chills. No abdominal pain, no nausea or vomiting.  Review of Systems  Constitutional: Negative for fever, chills, diaphoresis, activity change, appetite change and fatigue.  HENT: Negative for ear pain, nosebleeds, congestion, facial swelling, rhinorrhea, neck pain, neck stiffness and ear discharge.   Eyes: Negative for pain, discharge, redness, itching and visual disturbance.  Respiratory: per HPI  Cardiovascular: Negative for chest pain, palpitations and leg swelling.  Gastrointestinal: Negative for abdominal distention.  Genitourinary: Negative for dysuria, urgency, frequency, hematuria, flank pain, decreased urine volume, difficulty urinating and dyspareunia.  Musculoskeletal: Negative for back pain, joint swelling,  arthralgias and gait problem.  Neurological: Negative for dizziness, tremors, seizures, syncope, facial asymmetry, speech difficulty, weakness, light-headedness, numbness and headaches.  Hematological: Negative for adenopathy. Does not bruise/bleed easily.  Psychiatric/Behavioral: Negative for hallucinations, behavioral problems, confusion, dysphoric mood, decreased concentration and agitation.     Past Medical History  Diagnosis Date  . Abnormal liver function tests 01/2010     on hospital admission February 23, 2010 AP = 36, AST = 62, ALT = 40, GGT = 392  . COPD (chronic obstructive pulmonary disease)   . Hypertension   . Hyperlipidemia   . GERD (gastroesophageal reflux disease)   . Anxiety   . Depression   . Pruritus 06/2009     this is most likely secondary to cholestasis, last bilirubin 2.7 (02-25-2010)  . Hepatitis C 2012     a hepatitis panel done February 19, 1998 showed anti-HBc +, anti-HBV +, hep C ab reactive, February 23, 2010 hepatitis B DNA not detected, HCV RNA  VL = 444000  . Shortness of breath     with exereize  . Cataract   . Glaucoma   . HOH (hard of hearing)    Past Surgical History  Procedure Date  . Open anterior shoulder reconstruction 2003     surgery performed June 26, 2001 by Dr. Rennis Chris, open right shoulder anterior reconstruction secondary to recurrent right shoulder anterior inferior dislocation  . Knee arthroplasty 08/02/2011    Procedure: COMPUTER ASSISTED TOTAL KNEE ARTHROPLASTY;  Surgeon: [name], MD;  Location: [facility] OR;  Service: Orthopedics;  Laterality: Right;  Right total knee arthroplasty   Social History:  reports that he has been smoking Cigarettes.  He has a 73.5 pack-year smoking history. He does not have any smokeless tobacco history on file. He reports that he does not drink alcohol or use illicit drugs.  Allergies  Allergen Reactions  . Celecoxib  REACTION: strange behavior  . Penicillins   . Sulfa Antibiotics   .  Sulfonamide Derivatives     REACTION: itching   Family History  Problem Relation Age of Onset  . Stroke Neg Hx   . Cancer Neg Hx     Prior to Admission medications   Medication Sig Start Date End Date Taking? Authorizing Provider  aspirin 81 MG chewable tablet Chew 81 mg by mouth daily.   Yes Historical Provider, MD  cholestyramine Lanetta Inch) 4 G packet Take 1 packet by mouth 2 (two) times daily with a meal.    Yes Historical Provider, MD  citalopram (CELEXA) 20 MG tablet Take 20 mg by mouth daily.   Yes Historical Provider, MD  Fluticasone-Salmeterol (ADVAIR DISKUS) 250-50 MCG/DOSE AEPB Inhale 1 puff into the lungs 2 (two) times daily. 07/25/11  Yes Lorretta Harp, MD  latanoprost (XALATAN) 0.005 % ophthalmic solution Place 1 drop into both eyes at bedtime.   Yes Historical Provider, MD  lisinopril (PRINIVIL,ZESTRIL) 5 MG tablet Take 1 tablet (5 mg total) by mouth daily. 09/27/11  Yes Lorretta Harp, MD  omeprazole (PRILOSEC) 40 MG capsule Take 40 mg by mouth daily.   Yes Historical Provider, MD  oxyCODONE (OXY IR/ROXICODONE) 5 MG immediate release tablet Take 5-10 mg by mouth every 6 (six) hours as needed. For pain   Yes Historical Provider, MD  tiotropium (SPIRIVA) 18 MCG inhalation capsule Place 1 capsule (18 mcg total) into inhaler and inhale daily. 07/25/11  Yes Lorretta Harp, MD  albuterol (VENTOLIN HFA) 108 (90 BASE) MCG/ACT inhaler Inhale 2 puffs into the lungs every 4 (four) hours as needed. For shortness of breath or wheezing. 07/25/11   Lorretta Harp, MD   Physical Exam: Blood pressure 174/90, pulse 83, temperature 98.1 F (36.7 C), temperature source Oral, resp. rate 14, SpO2 97.00%. Filed Vitals:   10/01/11 0839 10/01/11 1047 10/01/11 1050 10/01/11 1055  BP: 174/90     Pulse: 86 83 81 83  Temp: 99 F (37.2 C) 98.1 F (36.7 C)    TempSrc: Oral     Resp: 18 13  14   SpO2: 91% 97% 95% 97%   BP 156/74  Pulse 84  Temp 98.8 F (37.1 C) (Oral)  Resp 17  Ht 5\' 8"  (1.727 m)  Wt 103.9 kg (229 lb  0.9 oz)  BMI 34.83 kg/m2  SpO2 98%  General Appearance:    No acute distress  Head:    Normocephalic, without obvious abnormality, atraumatic  Eyes:    PERRL,EOMI   Nose:   Nares normal, septum midline, mucosa normal, no drainage   or sinus tenderness  Throat:   Lips, mucosa, and tongue normal; teeth and gums normal  Neck:   Supple, symmetrical, trachea midline, no adenopathy;       thyroid:  No enlargement/tenderness/nodules; no carotid   bruit or JVD  Lungs:     Coarse breath sounds, rhonchi over upper lung lobes  Heart:    Regular rate and rhythm, S1 and S2 normal, no murmur, rub   or gallop  Abdomen:     Soft, non-tender, bowel sounds active all four quadrants,    no masses, no organomegaly  Extremities:  left knee swelling  Pulses:   2+ and symmetric all extremities  Skin:   Skin color, texture, turgor normal, no rashes or lesions  Lymph nodes:   Cervical, supraclavicular, and axillary nodes normal  Neurologic:   CNII-XII intact. Normal strength, sensation and reflexes  throughout     Labs on Admission:  Basic Metabolic Panel:  Lab 10/01/11 1610  NA 134*  K 3.8  CL 97  CO2 25  GLUCOSE 153*  BUN 15  CREATININE 0.95  CALCIUM 9.2  MG --  PHOS --   Liver Function Tests: No results found for this basename: AST:5,ALT:5,ALKPHOS:5,BILITOT:5,PROT:5,ALBUMIN:5 in the last 168 hours No results found for this basename: LIPASE:5,AMYLASE:5 in the last 168 hours No results found for this basename: AMMONIA:5 in the last 168 hours CBC:  Lab 10/01/11 0420  WBC 14.9*  NEUTROABS 11.5*  HGB 13.6  HCT 39.2  MCV 89.5  PLT 266   Radiological Exams on Admission: Dg Chest 2 View 10/01/2011    IMPRESSION: Interval development of infiltration or atelectasis in the left lung base.     Dg Knee Complete 4 Views Left 10/01/2011  * IMPRESSION:  1.  Increased size of moderate to large knee joint effusion. 2.  Moderate tricompartmental osteoarthritis, without significant change.       Time spent: 35 minutes

## 2011-10-01 NOTE — ED Notes (Signed)
Pt states he worked in yard yesterday, no known trauma to knee, pt now in severe pain. L knee swollen/warm. Pt given Fentanyl enroute to ED by EMS

## 2011-10-01 NOTE — Progress Notes (Signed)
Pharmacy - brief note  A: Vancomycin started today for knee infection. Now adding Levaquin for suspected pneumonia.   P: Start Levaquin 750mg  IV q24h and f/u daily.  Charolotte Eke, PharmD, pager [phone]. 10/01/2011,4:04 PM.

## 2011-10-01 NOTE — Progress Notes (Signed)
pacu note:  Per crna, pt has one hearing aid which is not currently in.  Hearing aid was taken by family

## 2011-10-01 NOTE — H&P (Signed)
[name] is an 66 y.o. male.   Chief Complaint:left knee acute infection HPI: noticed increased pain , swelling and severe pain attempting to ambulate.  Aspiration by ERMD    No crystals, cell count 76,000 turbid.  No gram stain done, cultures pending  Past Medical History  Diagnosis Date  . Abnormal liver function tests 01/2010     on hospital admission February 23, 2010 AP = 36, AST = 62, ALT = 40, GGT = 392  . COPD (chronic obstructive pulmonary disease)   . Hypertension   . Hyperlipidemia   . GERD (gastroesophageal reflux disease)   . Anxiety   . Depression   . Pruritus 06/2009     this is most likely secondary to cholestasis, last bilirubin 2.7 (02-25-2010)  . Hepatitis C 2012     a hepatitis panel done February 19, 1998 showed anti-HBc +, anti-HBV +, hep C ab reactive, February 23, 2010 hepatitis B DNA not detected, HCV RNA  VL = 444000  . Shortness of breath     with exereize  . Cataract   . Glaucoma   . HOH (hard of hearing)     Past Surgical History  Procedure Date  . Open anterior shoulder reconstruction 2003     surgery performed June 26, 2001 by Dr. Rennis Chris, open right shoulder anterior reconstruction secondary to recurrent right shoulder anterior inferior dislocation  . Knee arthroplasty 08/02/2011    Procedure: COMPUTER ASSISTED TOTAL KNEE ARTHROPLASTY;  Surgeon: [name], MD;  Location: [facility] OR;  Service: Orthopedics;  Laterality: Right;  Right total knee arthroplasty    Family History  Problem Relation Age of Onset  . Stroke Neg Hx   . Cancer Neg Hx    Social History:  reports that he has been smoking Cigarettes.  He has a 73.5 pack-year smoking history. He does not have any smokeless tobacco history on file. He reports that he does not drink alcohol or use illicit drugs.  Allergies:  Allergies  Allergen Reactions  . Celecoxib     REACTION: strange behavior  . Penicillins   . Sulfa Antibiotics   . Sulfonamide Derivatives     REACTION: itching      (Not in a hospital admission)  Results for orders placed during the hospital encounter of 09/30/11 (from the past 48 hour(s))  CELL COUNT + DIFF,  W/ CRYST-SYNVL FLD     Status: Abnormal   Collection Time   10/01/11  2:12 AM      Component Value Range Comment   Color, Synovial RED (*) YELLOW    Appearance-Synovial TURBID (*) CLEAR    Crystals, Fluid NO CRYSTALS SEEN      WBC, Synovial 16109 (*) 0 - 200 /cu mm    Neutrophil, Synovial 96 (*) 0 - 25 %    Lymphocytes-Synovial Fld 1  0 - 20 %    Monocyte-Macrophage-Synovial Fluid 3 (*) 50 - 90 %   CBC WITH DIFFERENTIAL     Status: Abnormal   Collection Time   10/01/11  4:20 AM      Component Value Range Comment   WBC 14.9 (*) 4.0 - 10.5 K/uL    RBC 4.38  4.22 - 5.81 MIL/uL    Hemoglobin 13.6  13.0 - 17.0 g/dL    HCT 60.4  54.0 - 98.1 %    MCV 89.5  78.0 - 100.0 fL    MCH 31.1  26.0 - 34.0 pg    MCHC 34.7  30.0 -  36.0 g/dL    RDW 16.1  09.6 - 04.5 %    Platelets 266  150 - 400 K/uL    Neutrophils Relative 77  43 - 77 %    Lymphocytes Relative 12  12 - 46 %    Monocytes Relative 11  3 - 12 %    Eosinophils Relative 0  0 - 5 %    Basophils Relative 0  0 - 1 %    Neutro Abs 11.5 (*) 1.7 - 7.7 K/uL    Lymphs Abs 1.8  0.7 - 4.0 K/uL    Monocytes Absolute 1.6 (*) 0.1 - 1.0 K/uL    Eosinophils Absolute 0.0  0.0 - 0.7 K/uL    Basophils Absolute 0.0  0.0 - 0.1 K/uL    WBC Morphology VACUOLATED NEUTROPHILS     BASIC METABOLIC PANEL     Status: Abnormal   Collection Time   10/01/11  4:20 AM      Component Value Range Comment   Sodium 134 (*) 135 - 145 mEq/L    Potassium 3.8  3.5 - 5.1 mEq/L    Chloride 97  96 - 112 mEq/L    CO2 25  19 - 32 mEq/L    Glucose, Bld 153 (*) 70 - 99 mg/dL    BUN 15  6 - 23 mg/dL    Creatinine, Ser 4.09  0.50 - 1.35 mg/dL    Calcium 9.2  8.4 - 81.1 mg/dL    GFR calc non Af Amer 85 (*) >90 mL/min    GFR calc Af Amer >90  >90 mL/min    Dg Chest 2 View  10/01/2011  *RADIOLOGY REPORT*  Clinical Data: Cough  and congestion.  Weakness.  Smoker.  CHEST - 2 VIEW  Comparison: 07/25/2011  Findings: Shallow inspiration.  Borderline heart size and pulmonary vascularity are likely normal for technique.  There is interval development of a linear infiltration or atelectasis in the left lung base.  Pneumonia should be considered in the appropriate clinical setting although atelectasis can also have this appearance.  No pneumothorax.  No blunting of costophrenic angles. Tortuous aorta.  Degenerative changes in the spine and shoulders.  IMPRESSION: Interval development of infiltration or atelectasis in the left lung base.   Original Report Authenticated By: Marlon Pel, M.D.    Dg Knee Complete 4 Views Left  10/01/2011  *RADIOLOGY REPORT*  Clinical Data: Severe knee pain and swelling.  No known injury.  LEFT KNEE - COMPLETE 4+ VIEW  Comparison: 07/01/2007  Findings: A moderate to large knee joint effusion is seen which is increased since previous study. Moderate tricompartmental osteoarthritis is seen, without significant interval change.  No other bone lesions are identified.  IMPRESSION:  1.  Increased size of moderate to large knee joint effusion. 2.  Moderate tricompartmental osteoarthritis, without significant change.   Original Report Authenticated By: Danae Orleans, M.D.     Review of Systems  Constitutional: Negative for chills.  HENT: Negative.   Eyes: Negative.   Respiratory:       Smokes 1 ppd  Cardiovascular: Negative.   Gastrointestinal: Negative.   Genitourinary: Negative.   Musculoskeletal:       Left TKA July 9th 2013  Skin: Positive for rash.       eythema ,   Over left knee  Psychiatric/Behavioral: Negative.     Blood pressure 165/81, pulse 86, temperature 99 F (37.2 C), temperature source Oral, resp. rate 20, SpO2 93.00%. Physical Exam  Constitutional: He is oriented to person, place, and time. He appears well-developed and well-nourished.  HENT:  Head: Normocephalic.  Eyes:  Pupils are equal, round, and reactive to light.  Neck: Normal range of motion.  Cardiovascular: Normal rate.   Respiratory: Effort normal.  GI: Soft.  Musculoskeletal:       Left knee 20 -60 rom with pain  Neurological: He is alert and oriented to person, place, and time. He has normal reflexes.  Skin: There is erythema.  Psychiatric: His behavior is normal. Judgment and thought content normal.     Assessment/Plan Left knee likely acute infection.   Plan knee arthroscopy acutely, Dr. Kerin Salen C 10/01/2011, 8:16 AM

## 2011-10-01 NOTE — Progress Notes (Signed)
ANTIBIOTIC CONSULT NOTE - INITIAL  Pharmacy Consult for Vancomycin Indication: Acute Left Knee Infection  Allergies  Allergen Reactions  . Celecoxib     REACTION: strange behavior  . Penicillins   . Sulfa Antibiotics   . Sulfonamide Derivatives     REACTION: itching    Patient Measurements:  Weight 106.3 kg (as of July 07/2011) Estimated CrCl ~ 114 ml/min Normalized CrCl ~ 77 ml/min   Vital Signs: Temp: 98.8 F (37.1 C) (09/07 1212) Temp src: Oral (09/07 0839) BP: 173/Neil mmHg (09/07 1212) Pulse Rate: 89  (09/07 1150) Intake/Output from previous day:   Intake/Output from this shift: Total I/O In: 1000 [I.V.:1000] Out: -   Labs:  Basename 10/01/11 0420  WBC 14.9*  HGB 13.6  PLT 266  LABCREA --  CREATININE 0.95   The CrCl is unknown because both a height and weight (above a minimum accepted value) are required for this calculation. No results found for this basename: VANCOTROUGH:2,VANCOPEAK:2,VANCORANDOM:2,GENTTROUGH:2,GENTPEAK:2,GENTRANDOM:2,TOBRATROUGH:2,TOBRAPEAK:2,TOBRARND:2,AMIKACINPEAK:2,AMIKACINTROU:2,AMIKACIN:2, in the last 72 hours   Microbiology: Recent Results (from the past 720 hour(s))  GRAM STAIN     Status: Normal   Collection Time   10/01/11 10:12 AM      Component Value Range Status Comment   Specimen Description SYNOVIAL LEFT KNEE   Final    Special Requests NONE   Final    Gram Stain     Final    Value: ABUNDANT WBC SEEN     NO ORGANISMS SEEN     Gram Stain Report Called to,Read Back By and Verified With: A.[facility] RN AT 1055 ON [id number] BY C.BONGEL   Report Status 10/01/2011 FINAL   Final     Medical History: Past Medical History  Diagnosis Date  . Abnormal liver function tests 01/2010     on hospital admission February 23, 2010 AP = 36, AST = 62, ALT = 40, GGT = 392  . COPD (chronic obstructive pulmonary disease)   . Hypertension   . Hyperlipidemia   . GERD (gastroesophageal reflux disease)   . Anxiety   . Depression   . Pruritus  06/2009     this is most likely secondary to cholestasis, last bilirubin 2.7 (02-25-2010)  . Hepatitis C 2012     a hepatitis panel done February 19, 1998 showed anti-HBc +, anti-HBV +, hep C ab reactive, February 23, 2010 hepatitis B DNA not detected, HCV RNA  VL = 444000  . Shortness of breath     with exereize  . Cataract   . Glaucoma   . HOH (hard of hearing)     Medications:  Prescriptions prior to admission  Medication Sig Dispense Refill  . aspirin 81 MG chewable tablet Chew 81 mg by mouth daily.      . cholestyramine (QUESTRAN) 4 G packet Take 1 packet by mouth 2 (two) times daily with a meal.       . citalopram (CELEXA) 20 MG tablet Take 20 mg by mouth daily.      . Fluticasone-Salmeterol (ADVAIR DISKUS) 250-50 MCG/DOSE AEPB Inhale 1 puff into the lungs 2 (two) times daily.  60 each  5  . latanoprost (XALATAN) 0.005 % ophthalmic solution Place 1 drop into both eyes at bedtime.      Marland Kitchen lisinopril (PRINIVIL,ZESTRIL) 5 MG tablet Take 1 tablet (5 mg total) by mouth daily.  30 tablet  11  . omeprazole (PRILOSEC) 40 MG capsule Take 40 mg by mouth daily.      Marland Kitchen oxyCODONE (OXY IR/ROXICODONE) 5  MG immediate release tablet Take 5-10 mg by mouth every 6 (six) hours as needed. For pain      . tiotropium (SPIRIVA) 18 MCG inhalation capsule Place 1 capsule (18 mcg total) into inhaler and inhale daily.  30 capsule  5  . albuterol (VENTOLIN HFA) 108 (90 BASE) MCG/ACT inhaler Inhale 2 puffs into the lungs every 4 (four) hours as needed. For shortness of breath or wheezing.  1 Inhaler  5   Scheduled:    . cholestyramine  1 packet Oral BID WC  . citalopram  20 mg Oral Daily  . docusate sodium  100 mg Oral BID  . fentaNYL      . Fluticasone-Salmeterol  1 puff Inhalation BID  .  HYDROmorphone (DILAUDID) injection  0.5 mg Intravenous Once  .  HYDROmorphone (DILAUDID) injection  0.5 mg Intravenous Once  .  HYDROmorphone (DILAUDID) injection  0.5 mg Intravenous Once  .  HYDROmorphone (DILAUDID)  injection  1 mg Intravenous Once  .  HYDROmorphone (DILAUDID) injection  1 mg Intravenous Once  . latanoprost  1 drop Both Eyes QHS  . lisinopril  5 mg Oral Daily  . pantoprazole  40 mg Oral Q1200  . tiotropium  18 mcg Inhalation Daily  . vancomycin  1,500 mg Intravenous Once  . DISCONTD:  HYDROmorphone (DILAUDID) injection  1 mg Intramuscular Once  . DISCONTD: piperacillin-tazobactam (ZOSYN)  IV  3.375 g Intravenous Q8H   Infusions:    . sodium chloride 75 mL/hr at 10/01/11 1242  . DISCONTD: lactated ringers Stopped (10/01/11 1100)  . DISCONTD: lactated ringers     Assessment: 66 yo [name] to start Vancomycin for septic left knee joint.  Patient received 1 g vancomycin pre-op.  Cultures pending.  Note allergy to pcns - rash/shortness of breath  Goal of Therapy:  Vancomycin trough level 15-20 mcg/ml  Plan:  1. Give additional 1500 mg Vancomycin to make 2500 mg load ( for patients > 100 kg). 2. Start vancomycin 1 g IV q 12 hours starting at midnight tonight. 3. Will obtain trough at steady state.   Neil [name], Neil [name], PharmD 10/01/2011,12:19 PM

## 2011-10-01 NOTE — ED Provider Notes (Signed)
6:42 AM Assumed care of patient at change of shift.  Sign out received from [employer], PA-C.  Patient with nontraumatic hemarthrosis with 76,000WBC in synovial fluid.  Pt also with serum WBC 14.9.  Dr Dierdre Highman has spoken with on call orthopedist, paged by accident as patient has seen Dr Magnus Ivan of [facility].  I spoke with Dr Ophelia Charter of [facility].  [facility] came to see patient, but patient and family prefer to stay with [place].    7:32 AM I spoke again with Dr Ophelia Charter who will come to ED to see the patient.    Patient admitted to Dr Ophelia Charter.   [place], [place] 10/01/11 1517

## 2011-10-01 NOTE — ED Notes (Signed)
Fluid obtained by PA sent to lab

## 2011-10-01 NOTE — ED Notes (Signed)
Patient transported to X-ray 

## 2011-10-01 NOTE — Anesthesia Preprocedure Evaluation (Addendum)
Anesthesia Evaluation  Patient identified by MRN, date of birth, ID band Patient awake    Reviewed: Allergy & Precautions, H&P , NPO status , Patient's Chart, lab work & pertinent test results  History of Anesthesia Complications (+) PONV  Airway Mallampati: II TM Distance: >3 FB Neck ROM: full    Dental  (+) Edentulous Upper and Edentulous Lower   Pulmonary shortness of breath and with exertion, pneumonia -, unresolved, COPD COPD inhaler, Current Smoker,  Developing pneumonia with RA O2 sat 90%. breath sounds clear to auscultation  Pulmonary exam normal       Cardiovascular Exercise Tolerance: Good hypertension, Pt. on medications negative cardio ROS  Rhythm:regular Rate:Normal     Neuro/Psych Anxiety Depression negative neurological ROS  negative psych ROS   GI/Hepatic negative GI ROS, Neg liver ROS, GERD-  Medicated and Controlled,(+) Hepatitis -, C  Endo/Other  negative endocrine ROS  Renal/GU negative Renal ROS  negative genitourinary   Musculoskeletal   Abdominal   Peds  Hematology negative hematology ROS (+)   Anesthesia Other Findings   Reproductive/Obstetrics negative OB ROS                          Anesthesia Physical Anesthesia Plan  ASA: III and Emergent  Anesthesia Plan: General   Post-op Pain Management:    Induction: Intravenous  Airway Management Planned: LMA  Additional Equipment:   Intra-op Plan:   Post-operative Plan: Possible Post-op intubation/ventilation  Informed Consent: I have reviewed the patients History and Physical, chart, labs and discussed the procedure including the risks, benefits and alternatives for the proposed anesthesia with the patient or authorized representative who has indicated his/her understanding and acceptance.   Dental Advisory Given  Plan Discussed with: CRNA and Surgeon  Anesthesia Plan Comments: (Borderline for respiratory  failure post op.  Will go to step down with medicine consult.)       Anesthesia Quick Evaluation

## 2011-10-02 DIAGNOSIS — J189 Pneumonia, unspecified organism: Secondary | ICD-10-CM

## 2011-10-02 LAB — HIV ANTIBODY (ROUTINE TESTING W REFLEX): HIV: NONREACTIVE

## 2011-10-02 LAB — BASIC METABOLIC PANEL
BUN: 15 mg/dL (ref 6–23)
Creatinine, Ser: 1.06 mg/dL (ref 0.50–1.35)
GFR calc Af Amer: 83 mL/min — ABNORMAL LOW (ref >90)
GFR calc non Af Amer: 71 mL/min — ABNORMAL LOW (ref >90)
Glucose, Bld: 121 mg/dL — ABNORMAL HIGH (ref 70–99)
Potassium: 3.5 mEq/L (ref 3.5–5.1)

## 2011-10-02 LAB — CBC
HCT: 35.2 % — ABNORMAL LOW (ref 39.0–52.0)
Hemoglobin: 11.7 g/dL — ABNORMAL LOW (ref 13.0–17.0)
MCH: 30.4 pg (ref 26.0–34.0)
MCHC: 33.2 g/dL (ref 30.0–36.0)
MCV: 91.4 fL (ref 78.0–100.0)
RDW: 13.7 % (ref 11.5–15.5)

## 2011-10-02 LAB — STREP PNEUMONIAE URINARY ANTIGEN: Strep Pneumo Urinary Antigen: NEGATIVE

## 2011-10-02 MED ORDER — ACETAMINOPHEN 325 MG PO TABS
650.0000 mg | ORAL_TABLET | Freq: Four times a day (QID) | ORAL | Status: DC | PRN
  Administered 2011-10-02: 650 mg via ORAL
  Filled 2011-10-02: qty 2

## 2011-10-02 NOTE — Progress Notes (Signed)
Subjective: 1 Day Post-Op Procedure(s) (LRB): ARTHROSCOPY KNEE (Left) Patient reports pain as moderate.  Denies chest pain or SOB.  WBC not down yet.  Appreciate Hospitalists' assistance with his care. Objective: Vital signs in last 24 hours: Temp:  [97.6 F (36.4 C)-99.7 F (37.6 C)] 99.3 F (37.4 C) (09/08 0359) Pulse Rate:  [81-100] 100  (09/08 0359) Resp:  [13-24] 22  (09/08 0455) BP: (153-178)/(68-92) 154/73 mmHg (09/08 0359) SpO2:  [87 %-99 %] 94 % (09/08 0455) Weight:  [103.9 kg (229 lb 0.9 oz)] 103.9 kg (229 lb 0.9 oz) (09/07 1212)  Intake/Output from previous day: 09/07 0701 - 09/08 0700 In: 3365 [P.O.:240; I.V.:2275; IV Piggyback:850] Out: 1225 [Urine:1225] Intake/Output this shift:     Basename 10/02/11 0340 10/01/11 0420  HGB 11.7* 13.6    Basename 10/02/11 0340 10/01/11 0420  WBC 14.9* 14.9*  RBC 3.85* 4.38  HCT 35.2* 39.2  PLT 203 266    Basename 10/02/11 0340 10/01/11 0420  NA 134* 134*  K 3.5 3.8  CL 100 97  CO2 24 25  BUN 15 15  CREATININE 1.06 0.95  GLUCOSE 121* 153*  CALCIUM 8.3* 9.2   No results found for this basename: LABPT:2,INR:2 in the last 72 hours  Intact pulses distally Dorsiflexion/Plantar flexion intact Incision: no drainage  Assessment/Plan: 1 Day Post-Op Procedure(s) (LRB): ARTHROSCOPY KNEE (Left) Up with therapy Continue IV antibiotics pending culture results. PT consult for mobility I did aspirate his left knee at the bedside and drained 40-50 cc of bloody fluid from his knee. Transfer out of step-down.  [name] Y 10/02/2011, 7:54 AM

## 2011-10-02 NOTE — Consult Note (Signed)
[facility] PROGRESS NOTE  [name] WGN:[id number] DOB: 14-Oct-1945 DOA: 09/30/2011 PCP: Lorretta Harp, MD  Brief narrative: 66 year old male presented with left knee swelling, status post arthroscopy found to have pneumonia.   Active Problems:  Community acquired pneumonia  - based on CXR findings left lung base pneumonia  - continue levaquin - patient may be transferred out of stepdown unit to regular floor  Knee effusion, left  - management per primary team    Code Status: full code Family Communication: no family at bedside Disposition Plan: per primary  Manson Passey, MD  [facility] Pager [phone]  If 7PM-7AM, please contact night-coverage [email or web address] Password [email or web address] 10/02/2011, 9:33 AM   LOS: 2 days   Procedures:  Arthroscopy - left knee  Antibiotics:  vanco  levaquin  HPI/Subjective: Feels better today. No acute events overnight.   Objective: Filed Vitals:   10/02/11 0430 10/02/11 0455 10/02/11 0800 10/02/11 0848  BP:   126/84   Pulse:   86   Temp:      TempSrc:      Resp: 18 22 18    Height:      Weight:      SpO2: 87% 94% 97% 97%    Intake/Output Summary (Last 24 hours) at 10/02/11 0933 Last data filed at 10/02/11 0900  Gross per 24 hour  Intake   3910 ml  Output   1525 ml  Net   2385 ml    Exam:   General:  Pt is alert, follows commands appropriately, not in acute distress  Cardiovascular: Regular rate and rhythm, S1/S2, no murmurs, no rubs, no gallops  Respiratory: still congested but no wheezing  Abdomen: Soft, non tender, non distended, bowel sounds present, no guarding  Extremities: tenderness left knee, pulses DP and PT palpable bilaterally  Neuro: Grossly nonfocal  Data Reviewed: Basic Metabolic Panel:  Lab 10/02/11 9528 10/01/11 0420  NA 134* 134*  K 3.5 3.8  CL 100 97  CO2 24 25  GLUCOSE 121* 153*  BUN 15 15  CREATININE 1.06 0.95  CALCIUM 8.3* 9.2  MG -- --  PHOS -- --   Liver Function  Tests: No results found for this basename: AST:5,ALT:5,ALKPHOS:5,BILITOT:5,PROT:5,ALBUMIN:5 in the last 168 hours No results found for this basename: LIPASE:5,AMYLASE:5 in the last 168 hours No results found for this basename: AMMONIA:5 in the last 168 hours CBC:  Lab 10/02/11 0340 10/01/11 0420  WBC 14.9* 14.9*  NEUTROABS -- 11.5*  HGB 11.7* 13.6  HCT 35.2* 39.2  MCV 91.4 89.5  PLT 203 266   Cardiac Enzymes: No results found for this basename: CKTOTAL:5,CKMB:5,CKMBINDEX:5,TROPONINI:5 in the last 168 hours BNP: No components found with this basename: POCBNP:5 CBG:  Lab 10/01/11 1716 10/01/11 1225  GLUCAP 98 103*    Recent Results (from the past 240 hour(s))  GRAM STAIN     Status: Normal   Collection Time   10/01/11 10:12 AM      Component Value Range Status Comment   Specimen Description SYNOVIAL LEFT KNEE   Final    Special Requests NONE   Final    Gram Stain     Final    Value: ABUNDANT WBC SEEN     NO ORGANISMS SEEN     Gram Stain Report Called to,Read Back By and Verified With: A.[facility] RN AT 1055 ON [id number] BY C.BONGEL   Report Status 10/01/2011 FINAL   Final   MRSA PCR SCREENING     Status: Normal   Collection  Time   10/01/11 12:28 PM      Component Value Range Status Comment   MRSA by PCR NEGATIVE  NEGATIVE Final      Studies: Dg Chest 2 View  10/01/2011  *RADIOLOGY REPORT*  Clinical Data: Cough and congestion.  Weakness.  Smoker.  CHEST - 2 VIEW  Comparison: 07/25/2011  Findings: Shallow inspiration.  Borderline heart size and pulmonary vascularity are likely normal for technique.  There is interval development of a linear infiltration or atelectasis in the left lung base.  Pneumonia should be considered in the appropriate clinical setting although atelectasis can also have this appearance.  No pneumothorax.  No blunting of costophrenic angles. Tortuous aorta.  Degenerative changes in the spine and shoulders.  IMPRESSION: Interval development of infiltration or  atelectasis in the left lung base.   Original Report Authenticated By: Marlon Pel, M.D.    Dg Knee Complete 4 Views Left  10/01/2011  *RADIOLOGY REPORT*  Clinical Data: Severe knee pain and swelling.  No known injury.  LEFT KNEE - COMPLETE 4+ VIEW  Comparison: 07/01/2007  Findings: A moderate to large knee joint effusion is seen which is increased since previous study. Moderate tricompartmental osteoarthritis is seen, without significant interval change.  No other bone lesions are identified.  IMPRESSION:  1.  Increased size of moderate to large knee joint effusion. 2.  Moderate tricompartmental osteoarthritis, without significant change.   Original Report Authenticated By: Danae Orleans, M.D.     Scheduled Meds:   . cholestyramine  1 packet Oral BID WC  . citalopram  20 mg Oral Daily  . docusate sodium  100 mg Oral BID  . fentaNYL      . Fluticasone-Salmeterol  1 puff Inhalation BID  . latanoprost  1 drop Both Eyes QHS  . levofloxacin (LEVAQUIN) IV  750 mg Intravenous Q24H  . lisinopril  5 mg Oral Daily  . pantoprazole  40 mg Oral Q1200  . tiotropium  18 mcg Inhalation Daily  . vancomycin  1,500 mg Intravenous Once  . vancomycin  1,000 mg Intravenous Q12H  . DISCONTD: piperacillin-tazobactam (ZOSYN)  IV  3.375 g Intravenous Q8H   Continuous Infusions:   . sodium chloride 75 mL/hr (10/02/11 0620)  . DISCONTD: lactated ringers Stopped (10/01/11 1100)  . DISCONTD: lactated ringers Stopped (10/01/11 1100)

## 2011-10-02 NOTE — Op Note (Signed)
NAMESI, JACHIM NO.:  [id number]  MEDICAL RECORD NO.:  [id number]  LOCATION:  1241                         FACILITY:  [facility]  PHYSICIAN:  Vanita Panda. Magnus Ivan, M.D.DATE OF BIRTH:  05/24/1945  DATE OF PROCEDURE:  10/01/2011 DATE OF DISCHARGE:                              OPERATIVE REPORT   PREOPERATIVE DIAGNOSIS:  Left septic knee joint.  POSTOPERATIVE DIAGNOSIS:  Left septic knee joint.  PROCEDURE:  Left knee arthroscopy with irrigation, debridement, and synovectomy of all 3 compartments.  SURGEON:  Vanita Panda. Magnus Ivan, M.D.  ANESTHESIA:  General.  BLOOD LOSS:  Minimal.  COMPLICATIONS:  None.  ANTIBIOTICS:  1 g IV vancomycin after cultures obtained.  COMPLICATIONS:  None.  INDICATIONS:  Mr. Dusza is a 66 year old gentleman who actually had a right total knee arthroplasty done in July of this year.  __________ this week and started to develop left knee pain.  He has never had any surgery on the left knee and __________ knee, was swollen so bad that he came to the emergency room last night.  They aspirated the knee and found 76,000 white cells, they did not send this for Gram-stain, but cultures are pending.  His peripheral white blood cell count is almost 15,000.  Due to the 76,000 white cells in the knee and obvious infection, we recommended an urgent arthroscopic intervention with synovectomy of the knee.  The risks and benefits were explained to him in detail and he did wish to proceed with surgery.  Postoperatively, I am going to obtain a general medicine consult because he does have questionable lower lobe fusion in the left lung as well and he has potential for getting sick from this knee.  DESCRIPTION OF PROCEDURE:  After informed consent was obtained, appropriate left knee was marked.  He was brought to the operating room, placed supine on the operative table.  General anesthesia was then obtained.  His left leg was then prepped  and draped from the thigh down the ankle with DuraPrep and sterile drapes including a sterile stockinette.  A time-out was called to identify the correct patient, correct left knee.  I then made an anterolateral arthroscopy portal, inserted a cannula in the knee and drained a large amount of fluid from the knee that appeared purulent.  I sent stat Gram-stain off the list. I then placed arthroscope into the knee and made an anteromedial incision and placed arthroscopic shaver, and then proceeded to carry out a systematic synovectomy throughout the knee.  I found severe degenerative changes in the knee, this has been well documented already with degenerative arthritis.  Once complete synovectomy was carried out as much as possible, I then allowed multiple liters of fluid to lavage to the knee.  I then removed all fluid from the knee and all instrumentation.  I closed the 2 portal sites with interrupted nylon suture.  A well-padded sterile dressing was applied.  He was awakened, extubated, and taken to the recovery room in stable condition.  All final counts were correct. There were no complications noted.     Vanita Panda. Magnus Ivan, M.D.     CYB/MEDQ  D:  10/01/2011  T:  10/02/2011  Job:  [id number]

## 2011-10-02 NOTE — Evaluation (Signed)
Physical Therapy Evaluation Patient Details Name: [name] MRN: [id number] DOB: April 23, 1945 Today's Date: 10/02/2011 Time: 7829-5621 PT Time Calculation (min): 27 min  PT Assessment / Plan / Recommendation Clinical Impression  Pt presents with L knee effusion with arthroscopy and synovectomy.  Tolerated OOB to chair, however with very high level of pain and requiring +2 to complete transfer.  Pt will benefit from skilled PT in acute venue to address deficits.  PT recommends SNF for follow up therapy to increase safety and decrease burden of care.      PT Assessment  Patient needs continued PT services    Follow Up Recommendations  Skilled nursing facility    Barriers to Discharge Decreased caregiver support Requires +2 assist at this time    Equipment Recommendations  Defer to next venue    Recommendations for Other Services OT consult   Frequency Min 5X/week    Precautions / Restrictions Precautions Precautions: Fall Restrictions Weight Bearing Restrictions: No Other Position/Activity Restrictions: WBAT   Pertinent Vitals/Pain 10/10, ice packs applied, RN aware      Mobility  Bed Mobility Bed Mobility: Supine to Sit;Sitting - Scoot to Edge of Bed Supine to Sit: 1: +2 Total assist Supine to Sit: Patient Percentage: 70% Sitting - Scoot to Edge of Bed: 3: Mod assist Details for Bed Mobility Assistance: Pt able to get himself to EOB mostly on his own, however requires INCREASED time.  Unable to fully assist LLE due to pain and pt not allowing PT to touch leg.  Transfers Transfers: Sit to Stand;Stand to Sit;Stand Pivot Transfers Sit to Stand: 1: +2 Total assist;From elevated surface;With upper extremity assist;From bed Sit to Stand: Patient Percentage: 40% Stand to Sit: 1: +2 Total assist;With upper extremity assist;With armrests;To chair/3-in-1 Stand to Sit: Patient Percentage: 50% Stand Pivot Transfers: 1: +2 Total assist Stand Pivot Transfers: Patient Percentage:  50% Details for Transfer Assistance: Requires assist to rise and maintain upright position with max cues for hand placement and safety. Took some steps from bed to chair with assist for weight shifting and LE advancement, MAX cues for sequencing/technique.  Ambulation/Gait Ambulation/Gait Assistance: Not tested (comment) Stairs: No Wheelchair Mobility Wheelchair Mobility: No    Exercises     PT Diagnosis: Difficulty walking;Generalized weakness;Acute pain  PT Problem List: Decreased strength;Decreased range of motion;Decreased activity tolerance;Decreased balance;Decreased mobility;Decreased knowledge of use of DME;Pain PT Treatment Interventions: DME instruction;Gait training;Functional mobility training;Therapeutic activities;Therapeutic exercise;Balance training;Patient/family education   PT Goals Acute Rehab PT Goals PT Goal Formulation: With patient Time For Goal Achievement: 10/09/11 Potential to Achieve Goals: Fair Pt will go Supine/Side to Sit: with supervision PT Goal: Supine/Side to Sit - Progress: Goal set today Pt will go Sit to Supine/Side: with supervision PT Goal: Sit to Supine/Side - Progress: Goal set today Pt will go Sit to Stand: with min assist PT Goal: Sit to Stand - Progress: Goal set today Pt will go Stand to Sit: with supervision PT Goal: Stand to Sit - Progress: Goal set today Pt will Transfer Bed to Chair/Chair to Bed: with mod assist PT Transfer Goal: Bed to Chair/Chair to Bed - Progress: Goal set today Pt will Ambulate: 16 - 50 feet;with mod assist;with least restrictive assistive device PT Goal: Ambulate - Progress: Goal set today  Visit Information  Last PT Received On: 10/02/11 Assistance Needed: +2    Subjective Data  Subjective: Dont touch my leg! Patient Stated Goal: to get pain under control.    Prior Functioning  Home Living Lives With:  Spouse Available Help at Discharge: Skilled Nursing Facility Type of Home: House Home Access: Stairs  to enter Secretary/administrator of Steps: 4 Home Layout: One level Bathroom Shower/Tub: Engineer, manufacturing systems: Standard Home Adaptive Equipment: Bedside commode/3-in-1;Walker - rolling Additional Comments: Prior to pt having pain/infection in knee, was able to do everything on his own, however recently was barely able to get to couch/chair due to increased pain.  Prior Function Level of Independence: Independent Able to Take Stairs?: Yes Driving: Yes Vocation: Retired Musician: Clinical cytogeneticist  Overall Cognitive Status: Appears within functional limits for tasks assessed/performed Arousal/Alertness: Awake/alert Orientation Level: Appears intact for tasks assessed Behavior During Session: [facility] for tasks performed    Extremity/Trunk Assessment Right Lower Extremity Assessment RLE ROM/Strength/Tone: [facility] for tasks assessed Left Lower Extremity Assessment LLE ROM/Strength/Tone: Unable to fully assess;Due to pain   Balance    End of Session PT - End of Session Equipment Utilized During Treatment: Gait belt Activity Tolerance: Patient limited by pain Patient left: in chair;with call bell/phone within reach Nurse Communication: Mobility status  GP     Page, Meribeth Mattes 10/02/2011, 3:36 PM

## 2011-10-03 ENCOUNTER — Encounter (HOSPITAL_COMMUNITY): Payer: Self-pay | Admitting: Orthopaedic Surgery

## 2011-10-03 LAB — BASIC METABOLIC PANEL
BUN: 12 mg/dL (ref 6–23)
Calcium: 8.3 mg/dL — ABNORMAL LOW (ref 8.4–10.5)
Chloride: 104 mEq/L (ref 96–112)
Creatinine, Ser: 0.91 mg/dL (ref 0.50–1.35)
GFR calc Af Amer: >90 mL/min (ref >90)

## 2011-10-03 LAB — LEGIONELLA ANTIGEN, URINE: Legionella Antigen, Urine: NEGATIVE

## 2011-10-03 LAB — URIC ACID: Uric Acid, Serum: 5.3 mg/dL (ref 4.0–7.8)

## 2011-10-03 LAB — CBC
HCT: 34.5 % — ABNORMAL LOW (ref 39.0–52.0)
MCH: 30.3 pg (ref 26.0–34.0)
MCV: 91 fL (ref 78.0–100.0)
Platelets: 218 10*3/uL (ref 150–400)
RDW: 13.4 % (ref 11.5–15.5)

## 2011-10-03 MED ORDER — LISINOPRIL 10 MG PO TABS
10.0000 mg | ORAL_TABLET | Freq: Every day | ORAL | Status: DC
  Administered 2011-10-04 – 2011-10-05 (×2): 10 mg via ORAL
  Filled 2011-10-03 (×2): qty 1

## 2011-10-03 MED ORDER — PREDNISONE 10 MG PO TABS
10.0000 mg | ORAL_TABLET | Freq: Every day | ORAL | Status: AC
  Administered 2011-10-03: 10 mg via ORAL
  Filled 2011-10-03 (×2): qty 1

## 2011-10-03 MED ORDER — IBUPROFEN 800 MG PO TABS
800.0000 mg | ORAL_TABLET | Freq: Three times a day (TID) | ORAL | Status: DC
  Administered 2011-10-03 – 2011-10-05 (×7): 800 mg via ORAL
  Filled 2011-10-03 (×10): qty 1

## 2011-10-03 NOTE — Progress Notes (Signed)
Physical Therapy Treatment Patient Details Name: [name] MRN: [id number] DOB: 1945/09/22 Today's Date: 10/03/2011 Time: 1107-1130 PT Time Calculation (min): 23 min  PT Assessment / Plan / Recommendation Comments on Treatment Session  pt progressing, pain better this am allowing increased mobility    Follow Up Recommendations  Skilled nursing facility (if progresses well, possibly HHPT)    Barriers to Discharge        Equipment Recommendations  None recommended by PT    Recommendations for Other Services    Frequency Min 5X/week   Plan Discharge plan remains appropriate;Frequency remains appropriate    Precautions / Restrictions Precautions Precautions: Fall Restrictions Weight Bearing Restrictions: No Other Position/Activity Restrictions: WBAT   Pertinent Vitals/Pain     Mobility  Bed Mobility Bed Mobility: Supine to Sit;Sitting - Scoot to Edge of Bed Supine to Sit: 4: Min assist Sitting - Scoot to [place] of Bed: 4: Min assist Details for Bed Mobility Assistance: min with LLE and cues to self assist Transfers Transfers: Sit to Stand;Stand to Sit Sit to Stand: 1: +2 Total assist Sit to Stand: Patient Percentage: 70% Stand to Sit: 4: Min assist;To chair/3-in-1;With armrests;With upper extremity assist Details for Transfer Assistance: assist with wt shift to stand and initial balance; cues for hadn placement Ambulation/Gait Ambulation/Gait Assistance: 4: Min assist Ambulation Distance (Feet): 24 Feet Assistive device: Rolling walker Ambulation/Gait Assistance Details: cue sfor sequence and RW distance from self Gait Pattern: Step-to pattern;Decreased stance time - left;Trunk flexed General Gait Details: increased time    Exercises     PT Diagnosis:    PT Problem List:   PT Treatment Interventions:     PT Goals Acute Rehab PT Goals Time For Goal Achievement: 10/09/11 Potential to Achieve Goals: Good Pt will go Supine/Side to Sit: with supervision PT Goal:  Supine/Side to Sit - Progress: Progressing toward goal Pt will go Sit to Stand: with min assist PT Goal: Sit to Stand - Progress: Progressing toward goal Pt will go Stand to Sit: with supervision PT Goal: Stand to Sit - Progress: Progressing toward goal Pt will Ambulate: 16 - 50 feet;with mod assist;with least restrictive assistive device PT Goal: Ambulate - Progress: Progressing toward goal  Visit Information  Last PT Received On: 10/03/11 Assistance Needed: +2 (IV and chair)    Subjective Data  Subjective: I think it is better Patient Stated Goal: to get pain under control.    Cognition  Overall Cognitive Status: Appears within functional limits for tasks assessed/performed Arousal/Alertness: Awake/alert Orientation Level: Appears intact for tasks assessed Behavior During Session: [facility] for tasks performed    Balance     End of Session PT - End of Session Equipment Utilized During Treatment: Gait belt Activity Tolerance: Patient tolerated treatment well Patient left: in chair;with call bell/phone within reach Nurse Communication: Mobility status   GP     [facility] 10/03/2011, 11:38 AM

## 2011-10-03 NOTE — Progress Notes (Signed)
Pt sputum specimen sent to lab. Lab technician Carlena Sax called to notify me sample was unacceptable.  I will re enter order for [place] to recollect. [name] 5:32 AM

## 2011-10-03 NOTE — Consult Note (Signed)
[facility] PROGRESS NOTE  Amore Grater OZH:[id number] DOB: [birth year] DOA: 09/30/2011 PCP: Lorretta Harp, MD  Brief narrative: 66 year old male presented with left knee swelling, status post arthroscopy found to have pneumonia.   Active Problems:  Community acquired pneumonia  - based on CXR findings left lung base pneumonia  - continue levaquin   Knee effusion, left  - management per primary team   Code Status: full code  Family Communication: no family at bedside  Disposition Plan: per primary   Manson Passey, MD  [facility] Pager [phone]  If 7PM-7AM, please contact night-coverage [email or web address] Password [email or web address] 10/03/2011, 9:25 AM   LOS: 3 days   HPI/Subjective: Feels better.  Objective: Filed Vitals:   10/02/11 2159 10/02/11 2323 10/03/11 0612 10/03/11 0615  BP: 155/73  173/76 164/78  Pulse: 95  84   Temp: 101.8 F (38.8 C) 99.6 F (37.6 C) 99.3 F (37.4 C)   TempSrc: Oral  Oral   Resp: 17  20   Height:      Weight:      SpO2: 98%  97%     Intake/Output Summary (Last 24 hours) at 10/03/11 1324 Last data filed at 10/03/11 0800  Gross per 24 hour  Intake   1865 ml  Output   2875 ml  Net  -1010 ml    Exam:   General:  Pt is alert, follows commands appropriately, not in acute distress  Cardiovascular: Regular rate and rhythm, S1/S2, no murmurs, no rubs, no gallops  Respiratory: coarse breath sounds, no wheezing, no crackles, no rhonchi  Abdomen: Soft, non tender, non distended, bowel sounds present, no guarding  Extremities: No edema, pulses DP and PT palpable bilaterally  Neuro: Grossly nonfocal  Data Reviewed: Basic Metabolic Panel:  Lab 10/03/11 4010 10/02/11 0340 10/01/11 0420  NA 138 134* 134*  K 3.5 3.5 3.8  CL 104 100 97  CO2 24 24 25   GLUCOSE 111* 121* 153*  BUN 12 15 15   CREATININE 0.91 1.06 0.95  CALCIUM 8.3* 8.3* 9.2   CBC:  Lab 10/03/11 0350 10/02/11 0340 10/01/11 0420  WBC 13.0* 14.9* 14.9*  HGB 11.5* 11.7* 13.6  HCT 34.5*  35.2* 39.2  MCV 91.0 91.4 89.5  PLT 218 203 266   CBG:  Lab 10/01/11 1716 10/01/11 1225  GLUCAP 98 103*    BODY FLUID CULTURE     Status: Normal (Preliminary result)   Collection Time   10/01/11  2:12 AM      Component Value Range Status Comment   Specimen Description SYNOVIAL   Final    Special Requests NONE   Final    Gram Stain     Final    Value: ABUNDANT WBC PRESENT, PREDOMINANTLY PMN     NO ORGANISMS SEEN   Culture NO GROWTH 1 DAY   Final    Report Status PENDING   Incomplete   GRAM STAIN     Status: Normal   Collection Time   10/01/11 10:12 AM      Component Value Range Status Comment   Specimen Description SYNOVIAL LEFT KNEE   Final    Special Requests NONE   Final    Gram Stain     Final    Value: ABUNDANT WBC SEEN     NO ORGANISMS SEEN     Gram Stain Report Called to,Read Back By and Verified With: A.[facility] RN AT 1055 ON [id number] BY C.BONGEL   Report Status 10/01/2011 FINAL   Final  MRSA PCR SCREENING     Status: Normal   Collection Time   10/01/11 12:28 PM      Component Value Range Status Comment   MRSA by PCR NEGATIVE  NEGATIVE Final   CULTURE, EXPECTORATED SPUTUM-ASSESSMENT     Status: Normal   Collection Time   10/03/11  4:39 AM      Component Value Range Status Comment   Specimen Description SPUTUM   Final    Special Requests NONE   Final    Sputum evaluation     Final    Value: MICROSCOPIC FINDINGS SUGGEST THAT THIS SPECIMEN IS NOT REPRESENTATIVE OF LOWER RESPIRATORY SECRETIONS. PLEASE RECOLLECT.     CALLED TO TRIPLEN,S. RN AT 0530 ON 10/03/11 BY GILLESPIE,B.   Report Status 10/03/2011 FINAL   Final      Studies: No results found.  Scheduled Meds:   . cholestyramine  1 packet Oral BID WC  . citalopram  20 mg Oral Daily  . docusate sodium  100 mg Oral BID  . Fluticasone-Salmeterol  1 puff Inhalation BID  . ibuprofen  800 mg Oral TID  . latanoprost  1 drop Both Eyes QHS  . levofloxacin (LEVAQUIN)   750 mg Intravenous Q24H  . lisinopril  10 mg Oral  Daily  . pantoprazole  40 mg Oral Q1200  . predniSONE  10 mg Oral Q breakfast  . tiotropium  18 mcg Inhalation Daily  . vancomycin  1,000 mg Intravenous Q12H   Continuous Infusions:   . sodium chloride 75 mL/hr at 10/02/11 2320

## 2011-10-03 NOTE — Progress Notes (Signed)
CSW met with pt today to assist with d/c planning. PT recommending ST SNF placement at this time. Pt has declined SNF requesting to d/c home when stable. CSW is available to assist with d/c planning if plan changes and SNF is requested.  Cori Razor LCSW [phone]

## 2011-10-03 NOTE — Care Management Note (Unsigned)
    Page 1 of 1   10/03/2011     6:29:45 PM   CARE MANAGEMENT NOTE 10/03/2011  Patient:  OMARI, MCMANAWAY   Account Number:  [id number]  Date Initiated:  10/03/2011  Documentation initiated by:  Colleen Can  Subjective/Objective Assessment:   DX KNEE PAIN AND SWELLING FOLLOWING ARTHRSCOPY SEVERAL DAYS AGO./INCISION AND DRAINAGE     Action/Plan:   CM SPOKE WITH PT. PLANS ARE FOR PATIENT TO RETURN TO [place] IN [place] WHERE CAREGIVERS WILL BE SPOUSE AND DAUGHTER. ALREADY HAS DME.HAS USED [facility] IN THE PAST IF HH SERVICES NEEDED WANTS TO USE AGAIN   Anticipated DC Date:  10/06/2011   Anticipated DC Plan:  HOME W HOME HEALTH SERVICES         Choice offered to / List presented to:             Status of service:  In process, will continue to follow Medicare Important Message given?   (If response is "NO", the following Medicare IM given date fields will be blank) Date Medicare IM given:   Date Additional Medicare IM given:    Discharge Disposition:    Per UR Regulation:  Reviewed for med. necessity/level of care/duration of stay  If discussed at Long Length of Stay Meetings, dates discussed:    Comments:

## 2011-10-03 NOTE — Progress Notes (Signed)
Subjective: 2 Days Post-Op Procedure(s) (LRB): ARTHROSCOPY KNEE (Left) Patient reports pain as moderate.  WBC only down to 13.  Cultures still pending. Objective: Vital signs in last 24 hours: Temp:  [98.1 F (36.7 C)-102.8 F (39.3 C)] 99.3 F (37.4 C) (09/09 0612) Pulse Rate:  [76-95] 84  (09/09 0612) Resp:  [17-20] 20  (09/09 0612) BP: (126-173)/(71-84) 164/78 mmHg (09/09 0615) SpO2:  [90 %-98 %] 97 % (09/09 0612)  Intake/Output from previous day: 09/08 0701 - 09/09 0700 In: 2095 [P.O.:920; I.V.:975; IV Piggyback:200] Out: 2975 [Urine:2975] Intake/Output this shift: Total I/O In: 1070 [P.O.:120; I.V.:750; IV Piggyback:200] Out: 1575 [Urine:1575]   Basename 10/03/11 0350 10/02/11 0340 10/01/11 0420  HGB 11.5* 11.7* 13.6    Basename 10/03/11 0350 10/02/11 0340  WBC 13.0* 14.9*  RBC 3.79* 3.85*  HCT 34.5* 35.2*  PLT 218 203    Basename 10/03/11 0350 10/02/11 0340  NA 138 134*  K 3.5 3.5  CL 104 100  CO2 24 24  BUN 12 15  CREATININE 0.91 1.06  GLUCOSE 111* 121*  CALCIUM 8.3* 8.3*   No results found for this basename: LABPT:2,INR:2 in the last 72 hours  Left knee still swollen, but a little less over the previous 24 hours.  Still painful and warm  Assessment/Plan: 2 Days Post-Op Procedure(s) (LRB): ARTHROSCOPY KNEE (Left) Up with therapy Continue IV antibiotics pending cultures Try NSAIDs to treat inflammation.  BLACKMAN,CHRISTOPHER Y 10/03/2011, 6:32 AM

## 2011-10-04 LAB — BASIC METABOLIC PANEL
CO2: 22 mEq/L (ref 19–32)
Calcium: 8.3 mg/dL — ABNORMAL LOW (ref 8.4–10.5)
Creatinine, Ser: 0.87 mg/dL (ref 0.50–1.35)
GFR calc non Af Amer: 88 mL/min — ABNORMAL LOW (ref >90)
Glucose, Bld: 118 mg/dL — ABNORMAL HIGH (ref 70–99)
Sodium: 138 mEq/L (ref 135–145)

## 2011-10-04 LAB — CBC
HCT: 34.1 % — ABNORMAL LOW (ref 39.0–52.0)
MCV: 91.2 fL (ref 78.0–100.0)
Platelets: 279 10*3/uL (ref 150–400)
RBC: 3.74 MIL/uL — ABNORMAL LOW (ref 4.22–5.81)
RDW: 13.2 % (ref 11.5–15.5)
WBC: 11.9 10*3/uL — ABNORMAL HIGH (ref 4.0–10.5)

## 2011-10-04 LAB — BODY FLUID CULTURE

## 2011-10-04 MED ORDER — VANCOMYCIN HCL 1000 MG IV SOLR
1250.0000 mg | Freq: Two times a day (BID) | INTRAVENOUS | Status: DC
  Administered 2011-10-04 – 2011-10-05 (×3): 1250 mg via INTRAVENOUS
  Filled 2011-10-04 (×4): qty 1250

## 2011-10-04 NOTE — Progress Notes (Signed)
Subjective: 3 Days Post-Op Procedure(s) (LRB): ARTHROSCOPY KNEE (Left) Patient reports pain as moderate.  Better over the past 24 hours.  Cultures still negative. Objective: Vital signs in last 24 hours: Temp:  [97.7 F (36.5 C)-98.1 F (36.7 C)] 97.8 F (36.6 C) (09/10 0542) Pulse Rate:  [75-82] 82  (09/10 0542) Resp:  [16] 16  (09/10 0542) BP: (149-170)/(64-72) 170/64 mmHg (09/10 0542) SpO2:  [93 %-94 %] 94 % (09/10 0542)  Intake/Output from previous day: 09/09 0701 - 09/10 0700 In: 3406.3 [P.O.:1200; I.V.:1706.3; IV Piggyback:500] Out: 1625 [Urine:1625] Intake/Output this shift: Total I/O In: 700 [I.V.:700] Out: 800 [Urine:800]   Basename 10/03/11 0350 10/02/11 0340  HGB 11.5* 11.7*    Basename 10/03/11 0350 10/02/11 0340  WBC 13.0* 14.9*  RBC 3.79* 3.85*  HCT 34.5* 35.2*  PLT 218 203    Basename 10/04/11 0400 10/03/11 0350  NA 138 138  K 3.4* 3.5  CL 105 104  CO2 22 24  BUN 16 12  CREATININE 0.87 0.91  GLUCOSE 118* 111*  CALCIUM 8.3* 8.3*   No results found for this basename: LABPT:2,INR:2 in the last 72 hours  Sensation intact distally Intact pulses distally Dorsiflexion/Plantar flexion intact Incision: no drainage No cellulitis present Knee effusion present, but less  Assessment/Plan: 3 Days Post-Op Procedure(s) (LRB): ARTHROSCOPY KNEE (Left) Up with therapy Continue antibiotics pending culture Follow-up on CBC today. Hopefully d/c to home tomorrow on oral abx if continues to improve and cultures still neg.  [name] Y 10/04/2011, 6:32 AM

## 2011-10-04 NOTE — Progress Notes (Signed)
ANTIBIOTIC CONSULT NOTE - FOLLOW UP  Pharmacy Consult for vancomycin Indication: Left Knee Infection   Allergies  Allergen Reactions  . Penicillins Shortness Of Breath and Rash  . Celecoxib     REACTION: strange behavior  . Sulfa Antibiotics   . Sulfonamide Derivatives     REACTION: itching    Patient Measurements: Height: 5\' 8"  (172.7 cm) Weight: 229 lb 0.9 oz (103.9 kg) IBW/kg (Calculated) : 68.4  Adjusted Body Weight:   Vital Signs: Temp: 97.7 F (36.5 C) (09/09 2205) Temp src: Oral (09/09 2205) BP: 149/71 mmHg (09/09 2205) Pulse Rate: 76  (09/09 2205) Intake/Output from previous day: 09/09 0701 - 09/10 0700 In: 2706.3 [P.O.:1200; I.V.:1006.3; IV Piggyback:500] Out: 825 [Urine:825] Intake/Output from this shift:    Labs:  Basename 10/03/11 0350 10/02/11 0340 10/01/11 0420  WBC 13.0* 14.9* 14.9*  HGB 11.5* 11.7* 13.6  PLT 218 203 266  LABCREA -- -- --  CREATININE 0.91 1.06 0.95   Estimated Creatinine Clearance: 93.3 ml/min (by C-G formula based on Cr of 0.91).  Basename 10/03/11 2300  VANCOTROUGH 14.4  VANCOPEAK --  VANCORANDOM --  GENTTROUGH --  GENTPEAK --  GENTRANDOM --  TOBRATROUGH --  TOBRAPEAK --  TOBRARND --  AMIKACINPEAK --  AMIKACINTROU --  AMIKACIN --     Microbiology: Recent Results (from the past 720 hour(s))  BODY FLUID CULTURE     Status: Normal (Preliminary result)   Collection Time   10/01/11  2:12 AM      Component Value Range Status Comment   Specimen Description SYNOVIAL   Final    Special Requests NONE   Final    Gram Stain     Final    Value: ABUNDANT WBC PRESENT, PREDOMINANTLY PMN     NO ORGANISMS SEEN   Culture NO GROWTH 2 DAYS   Final    Report Status PENDING   Incomplete   GRAM STAIN     Status: Normal   Collection Time   10/01/11 10:12 AM      Component Value Range Status Comment   Specimen Description SYNOVIAL LEFT KNEE   Final    Special Requests NONE   Final    Gram Stain     Final    Value: ABUNDANT WBC SEEN       NO ORGANISMS SEEN     Gram Stain Report Called to,Read Back By and Verified With: A.[facility] RN AT 1055 ON [id number] BY C.BONGEL   Report Status 10/01/2011 FINAL   Final   MRSA PCR SCREENING     Status: Normal   Collection Time   10/01/11 12:28 PM      Component Value Range Status Comment   MRSA by PCR NEGATIVE  NEGATIVE Final   CULTURE, BLOOD (ROUTINE X 2)     Status: Normal (Preliminary result)   Collection Time   10/01/11 10:33 PM      Component Value Range Status Comment   Specimen Description BLOOD RIGHT ARM   Final    Special Requests BOTTLES DRAWN AEROBIC AND ANAEROBIC    Final    Culture  Setup Time 10/02/2011 17:12   Final    Culture     Final    Value:        BLOOD CULTURE RECEIVED NO GROWTH TO DATE CULTURE WILL BE HELD FOR 5 DAYS BEFORE ISSUING A FINAL NEGATIVE REPORT   Report Status PENDING   Incomplete   CULTURE, BLOOD (ROUTINE X 2)  Status: Normal (Preliminary result)   Collection Time   10/01/11 10:33 PM      Component Value Range Status Comment   Specimen Description BLOOD RIGHT HAND   Final    Special Requests BOTTLES DRAWN AEROBIC AND ANAEROBIC    Final    Culture  Setup Time 10/02/2011 17:12   Final    Culture     Final    Value:        BLOOD CULTURE RECEIVED NO GROWTH TO DATE CULTURE WILL BE HELD FOR 5 DAYS BEFORE ISSUING A FINAL NEGATIVE REPORT   Report Status PENDING   Incomplete   CULTURE, EXPECTORATED SPUTUM-ASSESSMENT     Status: Normal   Collection Time   10/03/11  4:39 AM      Component Value Range Status Comment   Specimen Description SPUTUM   Final    Special Requests NONE   Final    Sputum evaluation     Final    Value: MICROSCOPIC FINDINGS SUGGEST THAT THIS SPECIMEN IS NOT REPRESENTATIVE OF LOWER RESPIRATORY SECRETIONS. PLEASE RECOLLECT.     CALLED TO TRIPLEN,S. RN AT 0530 ON 10/03/11 BY GILLESPIE,B.   Report Status 10/03/2011 FINAL   Final     Anti-infectives     Start     Dose/Rate Route Frequency Ordered Stop   10/02/11 0000    vancomycin (VANCOCIN) IVPB 1000 mg/200 mL premix        1,000 mg 200 mL/hr over 60 Minutes Intravenous Every 12 hours 10/01/11 1404     10/01/11 1800   levofloxacin (LEVAQUIN) IVPB 750 mg        750 mg 100 mL/hr over 90 Minutes Intravenous Every 24 hours 10/01/11 1726     10/01/11 1300   piperacillin-tazobactam (ZOSYN) IVPB 3.375 g  Status:  Discontinued        3.375 g 12.5 mL/hr over 240 Minutes Intravenous 3 times per day 10/01/11 1205 10/01/11 1248   10/01/11 1300   vancomycin (VANCOCIN) 1,500 mg in sodium chloride 0.9 % 500 mL IVPB        1,500 mg 250 mL/hr over 120 Minutes Intravenous  Once 10/01/11 1228 10/01/11 1441          Assessment: Patient with low vancomycin level.    Goal of Therapy:  Vancomycin trough level 15-20 mcg/ml  Plan:  Measure antibiotic drug levels at steady state Follow up culture results Change vancomycin to 1250mg  iv q12hr  Darlina Guys, Jacquenette Shone Crowford 10/04/2011,12:49 AM

## 2011-10-04 NOTE — Consult Note (Signed)
[facility] PROGRESS NOTE  [name] RUE:[id number] DOB: May 23, 1945 DOA: 09/30/2011 PCP: Lorretta Harp, MD  Brief narrative: 66 year old male presented with left knee swelling, status post arthroscopy found to have pneumonia.   Active Problems:  Community acquired pneumonia  - based on CXR findings left lung base pneumonia  - continue levaquin for 7 more days, may be transitioned to PO  Knee effusion, left  - management per primary team   Code Status: full code  Family Communication: no family at bedside  Disposition Plan: per primary  Will sign off, if further questions please call [phone] or pager [phone]  Manson Passey, MD  [facility] Pager [phone]  If 7PM-7AM, please contact night-coverage [email or web address] Password [email or web address] 10/04/2011, 9:18 AM   LOS: 4 days   HPI/Subjective: Still coughing.   Objective: Filed Vitals:   10/03/11 1333 10/03/11 2205 10/04/11 0542 10/04/11 0915  BP: 163/72 149/71 170/64   Pulse: 75 76 82   Temp: 98.1 F (36.7 C) 97.7 F (36.5 C) 97.8 F (36.6 C)   TempSrc:  Oral Oral   Resp: 16 16 16    Height:      Weight:      SpO2: 94% 93% 94% 95%    Intake/Output Summary (Last 24 hours) at 10/04/11 0918 Last data filed at 10/04/11 0825  Gross per 24 hour  Intake 3526.25 ml  Output   1625 ml  Net 1901.25 ml    Exam:   General:  Pt is alert, follows commands appropriately, not in acute distress  Cardiovascular: Regular rate and rhythm, S1/S2, no murmurs, no rubs, no gallops  Respiratory: still congested with some rhonchi and coarse breath sounds  Abdomen: Soft, non tender, non distended, bowel sounds present, no guarding  Extremities: No edema, pulses DP and PT palpable bilaterally  Neuro: Grossly nonfocal  Data Reviewed: Basic Metabolic Panel:  Lab 10/04/11 2841 10/03/11 0350 10/02/11 0340 10/01/11 0420  NA 138 138 134* 134*  K 3.4* 3.5 3.5 3.8  CL 105 104 100 97  CO2 22 24 24 25   GLUCOSE 118* 111* 121* 153*  BUN 16 12  15 15   CREATININE 0.87 0.91 1.06 0.95  CALCIUM 8.3* 8.3* 8.3* 9.2   CBC:  Lab 10/04/11 0801 10/03/11 0350 10/02/11 0340 10/01/11 0420  WBC 11.9* 13.0* 14.9* 14.9*  HGB 11.9* 11.5* 11.7* 13.6  HCT 34.1* 34.5* 35.2* 39.2  MCV 91.2 91.0 91.4 89.5  PLT 279 218 203 266   CBG:  Lab 10/01/11 1716 10/01/11 1225  GLUCAP 98 103*    Recent Results (from the past 240 hour(s))  BODY FLUID CULTURE     Status: Normal (Preliminary result)   Collection Time   10/01/11  2:12 AM      Component Value Range Status Comment   Specimen Description SYNOVIAL   Final    Special Requests NONE   Final    Gram Stain     Final    Value: ABUNDANT WBC PRESENT, PREDOMINANTLY PMN     NO ORGANISMS SEEN   Culture NO GROWTH 2 DAYS   Final    Report Status PENDING   Incomplete   GRAM STAIN     Status: Normal   Collection Time   10/01/11 10:12 AM      Component Value Range Status Comment   Specimen Description SYNOVIAL LEFT KNEE   Final    Special Requests NONE   Final    Gram Stain     Final    Value:  ABUNDANT WBC SEEN     NO ORGANISMS SEEN     Gram Stain Report Called to,Read Back By and Verified With: A.[facility] RN AT 1055 ON [id number] BY C.BONGEL   Report Status 10/01/2011 FINAL   Final   MRSA PCR SCREENING     Status: Normal   Collection Time   10/01/11 12:28 PM      Component Value Range Status Comment   MRSA by PCR NEGATIVE  NEGATIVE Final   CULTURE, BLOOD (ROUTINE X 2)     Status: Normal (Preliminary result)   Collection Time   10/01/11 10:33 PM      Component Value Range Status Comment   Specimen Description BLOOD RIGHT ARM   Final    Special Requests BOTTLES DRAWN AEROBIC AND ANAEROBIC    Final    Culture  Setup Time 10/02/2011 17:12   Final    Culture     Final    Value:        BLOOD CULTURE RECEIVED NO GROWTH TO DATE CULTURE WILL BE HELD FOR 5 DAYS BEFORE ISSUING A FINAL NEGATIVE REPORT   Report Status PENDING   Incomplete   CULTURE, BLOOD (ROUTINE X 2)     Status: Normal (Preliminary result)    Collection Time   10/01/11 10:33 PM      Component Value Range Status Comment   Specimen Description BLOOD RIGHT HAND   Final    Special Requests BOTTLES DRAWN AEROBIC AND ANAEROBIC    Final    Culture  Setup Time 10/02/2011 17:12   Final    Culture     Final    Value:        BLOOD CULTURE RECEIVED NO GROWTH TO DATE CULTURE WILL BE HELD FOR 5 DAYS BEFORE ISSUING A FINAL NEGATIVE REPORT   Report Status PENDING   Incomplete   CULTURE, EXPECTORATED SPUTUM-ASSESSMENT     Status: Normal   Collection Time   10/03/11  4:39 AM      Component Value Range Status Comment   Specimen Description SPUTUM   Final    Special Requests NONE   Final    Sputum evaluation     Final    Value: MICROSCOPIC FINDINGS SUGGEST THAT THIS SPECIMEN IS NOT REPRESENTATIVE OF LOWER RESPIRATORY SECRETIONS. PLEASE RECOLLECT.     CALLED TO TRIPLEN,S. RN AT 0530 ON 10/03/11 BY GILLESPIE,B.   Report Status 10/03/2011 FINAL   Final      Studies: No results found.  Scheduled Meds:   . cholestyramine  1 packet Oral BID WC  . citalopram  20 mg Oral Daily  . docusate sodium  100 mg Oral BID  . Fluticasone-Salmeterol  1 puff Inhalation BID  . ibuprofen  800 mg Oral TID  . latanoprost  1 drop Both Eyes QHS  . levofloxacin (LEVAQUIN) IV  750 mg Intravenous Q24H  . lisinopril  10 mg Oral Daily  . pantoprazole  40 mg Oral Q1200  . tiotropium  18 mcg Inhalation Daily  . vancomycin  1,250 mg Intravenous Q12H  . DISCONTD: lisinopril  5 mg Oral Daily  . DISCONTD: vancomycin  1,000 mg Intravenous Q12H   Continuous Infusions:   . sodium chloride 20 mL/hr at 10/04/11 [phone]

## 2011-10-04 NOTE — Progress Notes (Signed)
Physical Therapy Treatment Patient Details Name: [name] MRN: [id number] DOB: 1945/06/06 Today's Date: 10/04/2011 Time: 7829-5621 PT Time Calculation (min): 17 min  PT Assessment / Plan / Recommendation Comments on Treatment Session  Pt progressing well this visit and able to tolerate 200 feet with RW.  Pt reports plan for d/c home tomorrow.  Pt also reports one step into house.    Follow Up Recommendations  Home health PT;Supervision for mobility/OOB    Barriers to Discharge        Equipment Recommendations  None recommended by PT    Recommendations for Other Services    Frequency     Plan Discharge plan needs to be updated;Frequency remains appropriate    Precautions / Restrictions Precautions Precautions: Fall Restrictions Other Position/Activity Restrictions: WBAT   Pertinent Vitals/Pain 4/10 L knee pain, repositioned, premedicated    Mobility  Bed Mobility Bed Mobility: Not assessed Details for Bed Mobility Assistance: sitting EOB on arrival Transfers Transfers: Sit to Stand;Stand to Sit Sit to Stand: 4: Min guard;From bed;With upper extremity assist Stand to Sit: 4: Min guard;With armrests;To chair/3-in-1 Details for Transfer Assistance: increased time to rise, verbal cues for hand placement and bringing RW back to chair with descent Ambulation/Gait Ambulation/Gait Assistance: 4: Min guard Ambulation Distance (Feet): 200 Feet Assistive device: Rolling walker Ambulation/Gait Assistance Details: verbal cues for safe use of RW Gait Pattern: Step-to pattern;Decreased stance time - left;Trunk flexed Gait velocity: decreased    Exercises     PT Diagnosis:    PT Problem List:   PT Treatment Interventions:     PT Goals Acute Rehab PT Goals Pt will go Sit to Stand: with supervision PT Goal: Sit to Stand - Progress: Updated due to goal met PT Goal: Stand to Sit - Progress: Progressing toward goal PT Transfer Goal: Bed to Chair/Chair to Bed - Progress:  Discontinued (comment) (pt ambulating min/guard) Pt will Ambulate: >150 feet;with supervision;with least restrictive assistive device PT Goal: Ambulate - Progress: Updated due to goal met  Visit Information  Last PT Received On: 10/04/11 Assistance Needed: +1    Subjective Data  Subjective: The pain is better today.   Cognition  Overall Cognitive Status: Appears within functional limits for tasks assessed/performed    Balance     End of Session PT - End of Session Equipment Utilized During Treatment: Gait belt Activity Tolerance: Patient tolerated treatment well Patient left: in chair;with call bell/phone within reach   GP     Hasset Chaviano,KATHrine E 10/04/2011, 1:25 PM Pager: [phone]

## 2011-10-05 ENCOUNTER — Other Ambulatory Visit: Payer: Self-pay | Admitting: *Deleted

## 2011-10-05 DIAGNOSIS — B1920 Unspecified viral hepatitis C without hepatic coma: Secondary | ICD-10-CM

## 2011-10-05 LAB — BASIC METABOLIC PANEL
CO2: 24 mEq/L (ref 19–32)
Calcium: 8.6 mg/dL (ref 8.4–10.5)
Creatinine, Ser: 1.18 mg/dL (ref 0.50–1.35)
GFR calc Af Amer: 72 mL/min — ABNORMAL LOW (ref >90)
Sodium: 139 mEq/L (ref 135–145)

## 2011-10-05 LAB — CBC
MCH: 30.5 pg (ref 26.0–34.0)
MCV: 91.7 fL (ref 78.0–100.0)
Platelets: 289 10*3/uL (ref 150–400)
RBC: 3.51 MIL/uL — ABNORMAL LOW (ref 4.22–5.81)
RDW: 13.5 % (ref 11.5–15.5)
WBC: 9.2 10*3/uL (ref 4.0–10.5)

## 2011-10-05 MED ORDER — BISACODYL 5 MG PO TBEC: 10.0000 mg | DELAYED_RELEASE_TABLET | Freq: Every day | ORAL | Status: DC | PRN

## 2011-10-05 MED ORDER — CHOLESTYRAMINE 4 G PO PACK: 1.0000 | PACK | Freq: Two times a day (BID) | ORAL | Status: DC

## 2011-10-05 MED ORDER — OXYCODONE HCL 5 MG PO TABS: 5.0000 mg | ORAL_TABLET | Freq: Four times a day (QID) | ORAL | Status: DC | PRN

## 2011-10-05 MED ORDER — NAPROXEN 500 MG PO TABS: 500.0000 mg | ORAL_TABLET | Freq: Two times a day (BID) | ORAL | Status: DC

## 2011-10-05 MED ORDER — BISACODYL 5 MG PO TBEC
10.0000 mg | DELAYED_RELEASE_TABLET | Freq: Every day | ORAL | Status: DC | PRN
  Administered 2011-10-05: 10 mg via ORAL
  Filled 2011-10-05: qty 2

## 2011-10-05 MED ORDER — DOXYCYCLINE HYCLATE 100 MG PO TABS: 100.0000 mg | ORAL_TABLET | Freq: Two times a day (BID) | ORAL | Status: AC

## 2011-10-05 MED ORDER — BISACODYL 10 MG RE SUPP
10.0000 mg | Freq: Once | RECTAL | Status: AC
  Administered 2011-10-05: 10 mg via RECTAL
  Filled 2011-10-05: qty 1

## 2011-10-05 MED ORDER — BISACODYL 10 MG RE SUPP: 10.0000 mg | Freq: Every day | RECTAL | Status: DC | PRN

## 2011-10-05 NOTE — Progress Notes (Signed)
Physical Therapy Treatment Patient Details Name: [name] MRN: [id number] DOB: 1945-05-08 Today's Date: 10/05/2011 Time: 4098-1191 PT Time Calculation (min): 10 min  PT Assessment / Plan / Recommendation Comments on Treatment Session  Pt supervision level now for mobility and reports pain is much better. Pt feels ready for d/c home with spouse.    Follow Up Recommendations  Home health PT;Supervision for mobility/OOB    Barriers to Discharge        Equipment Recommendations  None recommended by PT    Recommendations for Other Services    Frequency     Plan Discharge plan remains appropriate;Frequency remains appropriate    Precautions / Restrictions Precautions Precautions: Fall Restrictions Other Position/Activity Restrictions: WBAT   Pertinent Vitals/Pain 3/10 L knee pain, pt reports premedicated    Mobility  Bed Mobility Bed Mobility: Supine to Sit Supine to Sit: 6: Modified independent (Device/Increase time) Transfers Transfers: Sit to Stand;Stand to Sit Sit to Stand: 5: Supervision;From bed Stand to Sit: 5: Supervision;To chair/3-in-1;With upper extremity assist Details for Transfer Assistance: pt left RW while turning to sit in chair, educated to keep RW with pt Ambulation/Gait Ambulation/Gait Assistance: 5: Supervision Ambulation Distance (Feet): 100 Feet Assistive device: Rolling walker Ambulation/Gait Assistance Details: pt reports knee feels much better today and pain "fine" with ambulation  Gait Pattern: Step-through pattern;Decreased stance time - left;Trunk flexed Gait velocity: decreased    Exercises     PT Diagnosis:    PT Problem List:   PT Treatment Interventions:     PT Goals Acute Rehab PT Goals PT Goal: Supine/Side to Sit - Progress: Met PT Goal: Sit to Stand - Progress: Partly met PT Goal: Stand to Sit - Progress: Partly met PT Goal: Ambulate - Progress: Progressing toward goal  Visit Information  Last PT Received On:  10/05/11 Assistance Needed: +1    Subjective Data  Subjective: I get to go home today.   Cognition  Overall Cognitive Status: Appears within functional limits for tasks assessed/performed    Balance     End of Session PT - End of Session Activity Tolerance: Patient tolerated treatment well Patient left: in chair;with call bell/phone within reach   GP     [facility] 10/05/2011, 11:42 AM Pager: [phone]

## 2011-10-05 NOTE — Progress Notes (Signed)
Patient ID: [name], male   DOB: [birth year], 66 y.o.   MRN: [id number] WBC normal now.  Pain decreased left knee.  Effusion decreased.  All cultures negative thus far.  Plan: Discharge to home today on oral antibiotics and follow-up in the office next week.

## 2011-10-05 NOTE — Discharge Summary (Signed)
Patient ID: [name] MRN: [id number] DOB/AGE: 07/25/1945 66 y.o.  Admit date: 09/30/2011 Discharge date: 10/05/2011  Admission Diagnoses:  Principal Problem:  *Knee effusion, left Active Problems:  DEPRESSION/ANXIETY  Essential hypertension, benign  COPD  GERD  Hepatitis C  CAP (community acquired pneumonia)   Discharge Diagnoses:  Same  Past Medical History  Diagnosis Date  . Abnormal liver function tests 01/2010     on hospital admission February 23, 2010 AP = 36, AST = 62, ALT = 40, GGT = 392  . COPD (chronic obstructive pulmonary disease)   . Hypertension   . Hyperlipidemia   . GERD (gastroesophageal reflux disease)   . Anxiety   . Depression   . Pruritus 06/2009     this is most likely secondary to cholestasis, last bilirubin 2.7 (02-25-2010)  . Hepatitis C 2012     a hepatitis panel done February 19, 1998 showed anti-HBc +, anti-HBV +, hep C ab reactive, February 23, 2010 hepatitis B DNA not detected, HCV RNA  VL = 444000  . Shortness of breath     with exereize  . Cataract   . Glaucoma   . HOH (hard of hearing)     Surgeries: Procedure(s): ARTHROSCOPY KNEE on 09/30/2011 - 10/01/2011   Consultants:    Discharged Condition: Improved  Hospital Course: [name] is an 66 y.o. male who was admitted 09/30/2011 for operative treatment ofKnee effusion, left. Patient has severe unremitting pain that affects sleep, daily activities, and work/hobbies. After pre-op clearance the patient was taken to the operating room on 09/30/2011 - 10/01/2011 and underwent  Procedure(s): ARTHROSCOPY KNEE.    Patient was given perioperative antibiotics: Anti-infectives     Start     Dose/Rate Route Frequency Ordered Stop   10/05/11 0000   doxycycline (VIBRA-TABS) 100 MG tablet        100 mg Oral 2 times daily 10/05/11 0736 10/15/11 2359   10/04/11 1000   vancomycin (VANCOCIN) 1,250 mg in sodium chloride 0.9 % 250 mL IVPB        1,250 mg 166.7 mL/hr over 90 Minutes Intravenous Every 12  hours 10/04/11 0053     10/02/11 0000   vancomycin (VANCOCIN) IVPB 1000 mg/200 mL premix  Status:  Discontinued        1,000 mg 200 mL/hr over 60 Minutes Intravenous Every 12 hours 10/01/11 1404 10/04/11 0056   10/01/11 1800   levofloxacin (LEVAQUIN) IVPB 750 mg        750 mg 100 mL/hr over 90 Minutes Intravenous Every 24 hours 10/01/11 1726     10/01/11 1300   piperacillin-tazobactam (ZOSYN) IVPB 3.375 g  Status:  Discontinued        3.375 g 12.5 mL/hr over 240 Minutes Intravenous 3 times per day 10/01/11 1205 10/01/11 1248   10/01/11 1300   vancomycin (VANCOCIN) 1,500 mg in sodium chloride 0.9 % 500 mL IVPB        1,500 mg 250 mL/hr over 120 Minutes Intravenous  Once 10/01/11 1228 10/01/11 1441           Patient was given sequential compression devices, early ambulation, and chemoprophylaxis to prevent DVT.  Patient benefited maximally from hospital stay and there were no complications.  Suprisingly, no cultures came back as positive for infectious organisms.  Recent vital signs: Patient Vitals for the past 24 hrs:  BP Temp Temp src Pulse Resp SpO2  10/05/11 1444 167/75 mmHg 97.6 F (36.4 C) Oral 72  20  94 %  10/05/11 1100 151/74 mmHg - - 76  - -  10/05/11 0750 - - - - - 96 %  10/05/11 0455 139/72 mmHg 98.2 F (36.8 C) Oral 71  20  99 %  10/29/2011 2148 - - - - - 96 %  10/29/2011 2109 124/68 mmHg 98 F (36.7 C) Oral 77  20  96 %     Recent laboratory studies:  Basename 10/05/11 0420 10/29/11 0801 10/29/11 0400  WBC 9.2 11.9* --  HGB 10.7* 11.9* --  HCT 32.2* 34.1* --  PLT 289 279 --  NA 139 -- 138  K 4.8 -- 3.4*  CL 107 -- 105  CO2 24 -- 22  BUN 21 -- 16  CREATININE 1.18 -- 0.87  GLUCOSE 93 -- 118*  INR -- -- --  CALCIUM 8.6 -- --     Discharge Medications:     Medication List     As of 10/05/2011  6:38 PM    TAKE these medications         albuterol 108 (90 BASE) MCG/ACT inhaler   Commonly known as: PROVENTIL HFA;VENTOLIN HFA   Inhale 2 puffs into the  lungs every 4 (four) hours as needed. For shortness of breath or wheezing.      aspirin 81 MG chewable tablet   Chew 81 mg by mouth daily.      citalopram 20 MG tablet   Commonly known as: CELEXA   Take 20 mg by mouth daily.      doxycycline 100 MG tablet   Commonly known as: VIBRA-TABS   Take 1 tablet (100 mg total) by mouth 2 (two) times daily.      Fluticasone-Salmeterol 250-50 MCG/DOSE Aepb   Commonly known as: ADVAIR   Inhale 1 puff into the lungs 2 (two) times daily.      latanoprost 0.005 % ophthalmic solution   Commonly known as: XALATAN   Place 1 drop into both eyes at bedtime.      lisinopril 5 MG tablet   Commonly known as: PRINIVIL,ZESTRIL   Take 1 tablet (5 mg total) by mouth daily.      naproxen 500 MG tablet   Commonly known as: NAPROSYN   Take 1 tablet (500 mg total) by mouth 2 (two) times daily with a meal.      omeprazole 40 MG capsule   Commonly known as: PRILOSEC   Take 40 mg by mouth daily.      oxyCODONE 5 MG immediate release tablet   Commonly known as: Oxy IR/ROXICODONE   Take 1-2 tablets (5-10 mg total) by mouth every 6 (six) hours as needed for pain. For pain      tiotropium 18 MCG inhalation capsule   Commonly known as: SPIRIVA   Place 1 capsule (18 mcg total) into inhaler and inhale daily.      ASK your doctor about these medications         cholestyramine 4 G packet   Commonly known as: QUESTRAN   Take 1 packet by mouth 2 (two) times daily with a meal.        Diagnostic Studies: Dg Chest 2 View  10/01/2011  *RADIOLOGY REPORT*  Clinical Data: Cough and congestion.  Weakness.  Smoker.  CHEST - 2 VIEW  Comparison: 07/25/2011  Findings: Shallow inspiration.  Borderline heart size and pulmonary vascularity are likely normal for technique.  There is interval development of a linear infiltration or atelectasis in the left lung base.  Pneumonia should be considered  in the appropriate clinical setting although atelectasis can also have this  appearance.  No pneumothorax.  No blunting of costophrenic angles. Tortuous aorta.  Degenerative changes in the spine and shoulders.  IMPRESSION: Interval development of infiltration or atelectasis in the left lung base.   Original Report Authenticated By: Marlon Pel, M.D.    Dg Knee Complete 4 Views Left  10/01/2011  *RADIOLOGY REPORT*  Clinical Data: Severe knee pain and swelling.  No known injury.  LEFT KNEE - COMPLETE 4+ VIEW  Comparison: 07/01/2007  Findings: A moderate to large knee joint effusion is seen which is increased since previous study. Moderate tricompartmental osteoarthritis is seen, without significant interval change.  No other bone lesions are identified.  IMPRESSION:  1.  Increased size of moderate to large knee joint effusion. 2.  Moderate tricompartmental osteoarthritis, without significant change.   Original Report Authenticated By: Danae Orleans, M.D.     Disposition: 01-Home or Self Care      Discharge Orders    Future Orders Please Complete By Expires   Diet - low sodium heart healthy      Call MD / Call 911      Comments:   If you experience chest pain or shortness of breath, CALL 911 and be transported to the hospital emergency room.  If you develope a fever above 101 F, pus (white drainage) or increased drainage or redness at the wound, or calf pain, call your surgeon's office.   Constipation Prevention      Comments:   Drink plenty of fluids.  Prune juice may be helpful.  You may use a stool softener, such as Colace (over the counter) 100 mg twice a day.  Use MiraLax (over the counter) for constipation as needed.   Increase activity slowly as tolerated      Discharge instructions      Comments:   Increase your activities as comfort allows. You can get your incisions wet in the shower. Alternate ice and heat on left knee twice daily   Discharge patient         Follow-up Information    Follow up with [name], MD. In 1 week.   Contact  information:   [facility] [address] [place] [address] [phone]           Signed: [name] 10/05/2011, 6:38 PM

## 2011-10-06 LAB — CULTURE, RESPIRATORY W GRAM STAIN: Culture: NORMAL

## 2011-10-06 NOTE — Progress Notes (Signed)
NAMEMarland Hancock  WON, [name]    MR#:  [id number]      DATE:  09/22/2011  DOB:  1945-05-27    cc:     REFERRING PHYSICIAN:   Dell Ponto, MD, [facility], [address], [place], [place], [address], fax [phone].  primary care physician:   Lorretta Harp, MD.  CONSULTING PHYSICIAN:   Elyse Jarvis, MD.   REASON FOR VISIT:  Follow up of genotype 1A hepatitis C and pruritus.   HISTORY:  The patient returns today accompanied by his wife. I last saw him on 09/30/2010.  Subsequent to this he reports that on cholestyramine his pruritus is significantly better, if not almost gone. There are no symptoms to suggest cryoglobulin mediated or decompensated liver disease. He remains naive to treatment for his genotype 1A hepatitis C.   PAST MEDICAL HISTORY:  Significant for chronic obstructive lung disease. He is treated with a combination of inhalers, but does not need home oxygen. He also has undergone a right knee replacement 08/02/2011 without complication, according to him and his wife.   It will be recalled he underwent a Lexiscan Myoview study on 10/29/2010 in anticipation of treating his hepatitis C, which showed a small apical defects consistent with apical thinning but could not rule out minimal apical ischemia. The electrocardiogram was negative. I had asked for pulmonary function testing regarding his chronic obstructive pulmonary disease but I cannot find records regarding this.   CURRENT MEDICATIONS:  Albuterol 2 inhalations 4 hours p.r.n., Cholestyramine 4 g AC, Citalopram 20 mg daily, Advair Diskus one inhalation b.i.d., Xalatan 0.005% drops one drop each eye at bedtime, Lisinopril 5 mg daily, Omeprazole 40 mg daily, Spiriva 1 capsule into inhaler daily, Ultram 50 mg q.8 hours p.r.n. He reports his Carbidol was discontinued at some point he believes because of low blood pressure.   ALLERGIES:  Celecoxib causes unknown reaction. Sulfa medications cause  pruritus and sporadic rash.   HABITS:  Still smoking approximately one to 1-1/2 half pack of cigarettes per day. Alcohol denies interval consumption.   REVIEW OF SYSTEMS:  All 10 systems reviewed today with the patient and his wife, and they are negative other which is mentioned above. His CES-D was 28.   PHYSICAL EXAMINATION:  Constitutional:  Well appearing. Appeared stated age without significant peripheral wasting or stigmata of chronic liver disease. Vital signs: Height 68 inches, weight 225 pounds, blood pressure 166/83, pulse 72. Temperature was not taken. Ears, Nose, Mouth and Throat:  Unremarkable oropharynx.  No thyromegaly or neck masses.  Chest:  Resonant to percussion.  Clear to auscultation.  Cardiovascular:  Heart sounds normal S1, S2 without murmurs or rubs.  There is no peripheral edema.  Abdomen:  Normal bowel sounds.  No masses or tenderness.  I could not appreciate a liver edge or spleen tip.  I could not appreciate any hernias.  Lymphatics:  No cervical or inguinal lymphadenopathy.  Central Nervous System:  No asterixis or focal neurologic findings.  Dermatologic:  Anicteric without palmar erythema or spider angiomata.  Eyes:  Anicteric sclerae.  Pupils are equal and reactive to light.  laboratory work:   From 07/25/2011, his liver enzymes were normal with an AST of 27, ALT 24, ALP 104, total bilirubin was 0.4 and albumin was 3.4. His CBC showed a platelet count of 204. INR was 0.97. He was O-positive.   Last imaging of his liver was a noncontrasted MRI/MRCP on 08/24/2010. It is notable that the liver did  not appear cirrhotic within the limits of the study, but also on 07/09/2010 an ultrasound of the liver was unremarkable.   ASSESSMENT:  The patient is a 66 year old gentleman with history of genotype 1A hepatitis C. I previously stated there was radiographic evidence to suggest cirrhosis, but I have not actually found the studies to suggest this so it may have been dictated in  error. Although a noncontrast MRI is of little value to image the liver, the ultrasounds have also not shown any evidence of cirrhosis and his synthetic function is preserved.   In terms of pruritus, without evidence to suggest primary sclerosing cholangitis or primary biliary cirrhosis I was more concerned that this was a drug reaction such as from his Alprazolam, which I know he is no longer taking. I highly doubt it was the hepatitis C. This is further proven now because the virus continues but he does not have symptoms. It may be worth attempting to taper off the cholestyramine, which he is asking me to agree to.   In terms of his hepatitis C, I am concerned that his chronic obstructive lung disease would represent a significant contraindication to treating him. I would again want pulmonary function testing and I have not been able to find any.  The stress testing I had previously asked for is already done.  To his credit however, he has been able to tolerate knee surgery without any problems. My concern would be that if I was to give him any ribavirin based therapies for his hepatitis C that the ribavirin induced anemia may exacerbate any underlying symptoms of lung disease and the interferon may induce inflammation in the airways worsening his lung disease. It also would seem best to wait for the availability of sofosbuvir in combination with peg interferon and ribavirin.   In my discussion today with the patient and his wife we discussed backing down on the cholestyramine to b.i.d. perhaps because of complaints of constipation. I pointed to him it is likely the constipation worsened after the introduction of narcotics after his surgery in combination with the cholestyramine. We discussed also using MiraLAX or a generic equivalent for the constipation.  Our discussion then turned to the issue of management of his hepatitis C. We discussed waiting for the availability of sofosbuvir to take for his  genotype 1A hepatitis C. We discussed the potential of interferon exacerbating his underlying lung disease.   PLAN:  1. Hepatitis A immune and B core antibody positive, so will take that as a sign of immunity.  2. Pneumovax and flu shot through his primary physician.  3. I would ask for a set of pulmonary function tests to be done through his primary physician at subsequent followup appointments to test his FEV1. 4. There is no indication for screening for varices or imaging studies as it appears that he does not have cirrhosis nor evidence of portal hypertension by his lab testing or previous imaging.  5. Followup will need to be arranged within approximately 6 months' time. As this clinic is closing, I have given instructions on how to contact us at [facility] for subsequent followup as well as how to get a [facility] medical record number. 6. I gave him written instructions to taper his cholestyramine down from t.i.d. to twice daily to see if it helps with the constipation.               Brooke Dare, MD   ADDENDUM Labs 09/23/11  Acceptable  [phone] .[phone]  D:  Thu Aug 29 18:23:24 2013 ; T:  Fri Aug 30 08:53:35 2013  Job #:  [id number]

## 2011-10-08 LAB — CULTURE, BLOOD (ROUTINE X 2)
Culture: NO GROWTH
Culture: NO GROWTH

## 2011-10-25 ENCOUNTER — Ambulatory Visit (INDEPENDENT_AMBULATORY_CARE_PROVIDER_SITE_OTHER): Payer: Medicare Other | Admitting: Internal Medicine

## 2011-10-25 ENCOUNTER — Encounter: Payer: Self-pay | Admitting: Internal Medicine

## 2011-10-25 VITALS — BP 143/80 | HR 80 | Temp 97.8°F | Ht 66.0 in | Wt 224.5 lb

## 2011-10-25 DIAGNOSIS — M25569 Pain in unspecified knee: Secondary | ICD-10-CM

## 2011-10-25 DIAGNOSIS — J449 Chronic obstructive pulmonary disease, unspecified: Secondary | ICD-10-CM

## 2011-10-25 DIAGNOSIS — Z0000 Encounter for general adult medical examination without abnormal findings: Secondary | ICD-10-CM

## 2011-10-25 DIAGNOSIS — B1920 Unspecified viral hepatitis C without hepatic coma: Secondary | ICD-10-CM

## 2011-10-25 DIAGNOSIS — K219 Gastro-esophageal reflux disease without esophagitis: Secondary | ICD-10-CM

## 2011-10-25 DIAGNOSIS — M25462 Effusion, left knee: Secondary | ICD-10-CM

## 2011-10-25 DIAGNOSIS — J4489 Other specified chronic obstructive pulmonary disease: Secondary | ICD-10-CM

## 2011-10-25 DIAGNOSIS — M25469 Effusion, unspecified knee: Secondary | ICD-10-CM

## 2011-10-25 DIAGNOSIS — Z23 Encounter for immunization: Secondary | ICD-10-CM

## 2011-10-25 MED ORDER — OXYCODONE HCL 5 MG PO TABS: 5.0000 mg | ORAL_TABLET | Freq: Four times a day (QID) | ORAL | Status: DC | PRN

## 2011-10-25 MED ORDER — TIOTROPIUM BROMIDE MONOHYDRATE 18 MCG IN CAPS: 18.0000 ug | ORAL_CAPSULE | Freq: Every day | RESPIRATORY_TRACT | Status: DC

## 2011-10-25 MED ORDER — FLUTICASONE-SALMETEROL 250-50 MCG/DOSE IN AEPB: 1.0000 | INHALATION_SPRAY | Freq: Two times a day (BID) | RESPIRATORY_TRACT | Status: DC

## 2011-10-25 MED ORDER — OMEPRAZOLE 40 MG PO CPDR: 40.0000 mg | DELAYED_RELEASE_CAPSULE | Freq: Every day | ORAL | Status: DC

## 2011-10-25 MED ORDER — ALBUTEROL SULFATE HFA 108 (90 BASE) MCG/ACT IN AERS: 2.0000 | INHALATION_SPRAY | RESPIRATORY_TRACT | Status: DC | PRN

## 2011-10-25 NOTE — Assessment & Plan Note (Signed)
-  His colonoscopy is up-to-date. -Patient had pneumococcal vaccination on 2011. -Patient reports that he had Zostavax in [facility]. -will give flu shot today.

## 2011-10-25 NOTE — Patient Instructions (Signed)
1. You did great job in cutting down on your smoking. Please make more efforts and cutdown further on your smoking.  2. Please take all medications as prescribed.  3. If you have worsening of your symptoms or new symptoms arise, please call the clinic ([phone]), or go to the ER immediately if symptoms are severe.

## 2011-10-25 NOTE — Progress Notes (Signed)
Patient ID: [name], male   DOB: 1945-08-10, 66 y.o.   MRN: [id number]  Subjective:   Patient ID: [name] male   DOB: [birth year] 66 y.o.   MRN: [id number]  HPI: Mr.[name] is a 66 y.o. with past medical history as outlined below, who presents for a followup visit.  1.) Left knee pain: Patient has had chronic bilaterally knee pain. He had right knee replacement surgery at 7/13. Currently he is doing well for his right knee. He was hospitalized from  09/30/11 to 10/05/11 due to severe left knee swelling and pain. He underwent arthroscopy, irrigation and debriment. He was found to have a 76,000 WBC from aspirated synovial fluid. Synovial fluid culture was negative and the Gram stain was negative for organisms. There was no crystals detected in the synovial fluid. Patient was treated with doxycycline for 10 days. Today patient reports that her left knee pain improved significantly. He only has minimal pain and mild swelling in her left knee. He does not have any fever or chills. Patient has an appointment with Dr. Magnus Ivan on 10/26/11.  2.) COPD: This is stable. The patient is currently taking albuterol, Advair and Spiriva inhalers. Patient reports that he has cut down his smoking from 1.5 to 1.0 PAD. He has mild shortness of breath which is at his baseline. He does not have cough, wheezing or chest pain.  3.) HCV: He has been followed up by Dr. Jacqualine Mau. Last seen was on 09/22/11. His skin itches improved significantly. His cholestyramine was changed to 4 g twice a day by Dr. Jacqualine Mau. Patient had MRCP on 09/01/10, which showed no evidence of cirrhosis and choledocholithiasis or obstructive mass. Due to the concerning of his chronic obstructive lung disease,  Dr. Jacqualine Mau did not start new treatment for HCV and suggested to get PFT first. Currently patient is doing well. He does not have nausea, vomiting or abdominal pain. His appetite is good.   Past Medical History  Diagnosis Date  . Abnormal liver  function tests 01/2010     on hospital admission February 23, 2010 AP = 36, AST = 62, ALT = 40, GGT = 392  . COPD (chronic obstructive pulmonary disease)   . Hypertension   . Hyperlipidemia   . GERD (gastroesophageal reflux disease)   . Anxiety   . Depression   . Pruritus 06/2009     this is most likely secondary to cholestasis, last bilirubin 2.7 (02-25-2010)  . Hepatitis C 2012     a hepatitis panel done February 19, 1998 showed anti-HBc +, anti-HBV +, hep C ab reactive, February 23, 2010 hepatitis B DNA not detected, HCV RNA  VL = 444000  . Shortness of breath     with exereize  . Cataract   . Glaucoma   . HOH (hard of hearing)    Current Outpatient Prescriptions  Medication Sig Dispense Refill  . albuterol (VENTOLIN HFA) 108 (90 BASE) MCG/ACT inhaler Inhale 2 puffs into the lungs every 4 (four) hours as needed. For shortness of breath or wheezing.  1 Inhaler  5  . aspirin 81 MG chewable tablet Chew 81 mg by mouth daily.      . cholestyramine (QUESTRAN) 4 G packet Take 1 packet by mouth 2 (two) times daily with a meal.  60 each  5  . citalopram (CELEXA) 20 MG tablet Take 20 mg by mouth daily.      . Fluticasone-Salmeterol (ADVAIR DISKUS) 250-50 MCG/DOSE AEPB Inhale 1 puff into the lungs 2 (  two) times daily.  60 each  5  . latanoprost (XALATAN) 0.005 % ophthalmic solution Place 1 drop into both eyes at bedtime.      Marland Kitchen lisinopril (PRINIVIL,ZESTRIL) 5 MG tablet Take 1 tablet (5 mg total) by mouth daily.  30 tablet  11  . naproxen (NAPROSYN) 500 MG tablet Take 1 tablet (500 mg total) by mouth 2 (two) times daily with a meal.  60 tablet  0  . omeprazole (PRILOSEC) 40 MG capsule Take 1 capsule (40 mg total) by mouth daily.  30 capsule  5  . oxyCODONE (OXY IR/ROXICODONE) 5 MG immediate release tablet Take 1-2 tablets (5-10 mg total) by mouth every 6 (six) hours as needed for pain. For pain  30 tablet  0  . tiotropium (SPIRIVA) 18 MCG inhalation capsule Place 1 capsule (18 mcg total) into inhaler  and inhale daily.  30 capsule  5  . DISCONTD: albuterol (VENTOLIN HFA) 108 (90 BASE) MCG/ACT inhaler Inhale 2 puffs into the lungs every 4 (four) hours as needed. For shortness of breath or wheezing.  1 Inhaler  5   Family History  Problem Relation Age of Onset  . Stroke Neg Hx   . Cancer Neg Hx    History   Social History  . Marital Status: Legally Separated    Spouse Name: N/A    Number of Children: N/A  . Years of Education: N/A   Social History Main Topics  . Smoking status: Current Every Day Smoker -- 1.5 packs/day for 49 years    Types: Cigarettes  . Smokeless tobacco: None   Comment: currently smokes 1.0 PAD since last two months (10/25/11)  . Alcohol Use: No  . Drug Use: No  . Sexually Active: None   Other Topics Concern  . None   Social History Narrative  . None   Review of Systems:  General: no fevers, chills, no changes in body weight, no changes in appetite Skin: no rash HEENT: no blurry vision, hearing changes or sore throat Pulm: has mild SOB. No coughing or wheezing CV: no chest pain, palpitations, shortness of breath Abd: no nausea/vomiting, abdominal pain, diarrhea/constipation GU: no dysuria, hematuria, polyuria Ext: mild knee pain bilaterally.  Neuro: no weakness, numbness, or tingling     Objective:  Physical Exam: Filed Vitals:   10/25/11 1524  BP: 143/80  Pulse: 80  Temp: 97.8 F (36.6 C)  TempSrc: Oral  Height: 5\' 6"  (1.676 m)  Weight: 224 lb 8 oz (101.833 kg)  SpO2: 93%   General: resting in bed, not in acute distress HEENT: PERRL, EOMI, no scleral icterus Cardiac: S1/S2, RRR, No murmurs, gallops or rubs Pulm: Good air movement bilaterally, Clear to auscultation bilaterally, No rales, wheezing, rhonchi or rubs. Abd: Soft,  nondistended, nontender, no rebound pain, no organomegaly, BS present Ext: No rashes. 2+DP/PT pulse bilaterally. There is tenderness and swelling over his knees bilaterally. There is minimal effusion in knee  joints bilaterally. There is no warmth or redness over knee joints. Musculoskeletal: No joint deformities, erythema, or stiffness, ROM full and no nontender Skin: no rashes. No skin bruise. Neuro: alert and oriented X3, cranial nerves II-XII grossly intact, muscle strength 5/5 in all extremeties,  sensation to light touch intact.  Psych.: patient is not psychotic, no suicidal or hemocidal ideation.   Assessment & Plan:

## 2011-10-25 NOTE — Assessment & Plan Note (Signed)
Patient recently underwent arthroscopy, irrigation and debriment for his left knee. Patient completed a course of doxycycline for 10 days. His left knee pain improved significantly. Today he only has minimal pain and mild swelling in his left knee. He does not have any fever or chills. Patient has an appointment with Dr. Magnus Ivan on 10/26/11. Will give him refill of Oxycodone and Will follow up.

## 2011-10-25 NOTE — Assessment & Plan Note (Addendum)
Patient's HCV VL was 444000. His liver function test showed that ALT, ALT and bilirubin were normal on 09/23/10. Patient has been followed up by Dr. Jacqualine Mau. Due to the concerning of his chronic obstructive lung disease,  Dr. Jacqualine Mau did not start new treatment for HCV at this moment and suggested to get PFT first. Will order a PFT test today and follow up.

## 2011-10-25 NOTE — Assessment & Plan Note (Addendum)
It is stable. He does not have any signs of acute exacerbation. His lung auscultation is clear bilaterally. We'll continue current regimen.

## 2011-11-15 ENCOUNTER — Encounter (HOSPITAL_COMMUNITY): Payer: Medicare Other

## 2011-11-22 ENCOUNTER — Ambulatory Visit (HOSPITAL_COMMUNITY)
Admission: RE | Admit: 2011-11-22 | Discharge: 2011-11-22 | Disposition: A | Discharge status: Home / Self Care | Payer: Medicare Other | Source: Ambulatory Visit | Attending: Internal Medicine | Admitting: Internal Medicine

## 2011-11-22 DIAGNOSIS — J449 Chronic obstructive pulmonary disease, unspecified: Secondary | ICD-10-CM | POA: Insufficient documentation

## 2011-11-22 DIAGNOSIS — J4489 Other specified chronic obstructive pulmonary disease: Secondary | ICD-10-CM | POA: Insufficient documentation

## 2011-11-22 MED ORDER — ALBUTEROL SULFATE (5 MG/ML) 0.5% IN NEBU
2.5000 mg | INHALATION_SOLUTION | Freq: Once | RESPIRATORY_TRACT | Status: AC
  Administered 2011-11-22: 2.5 mg via RESPIRATORY_TRACT

## 2012-01-12 ENCOUNTER — Encounter: Payer: Self-pay | Admitting: Internal Medicine

## 2012-01-12 ENCOUNTER — Ambulatory Visit (INDEPENDENT_AMBULATORY_CARE_PROVIDER_SITE_OTHER): Payer: Medicare Other | Admitting: Internal Medicine

## 2012-01-12 VITALS — BP 149/85 | HR 73 | Temp 96.4°F | Ht 66.0 in | Wt 229.5 lb

## 2012-01-12 DIAGNOSIS — F329 Major depressive disorder, single episode, unspecified: Secondary | ICD-10-CM

## 2012-01-12 DIAGNOSIS — M25561 Pain in right knee: Secondary | ICD-10-CM

## 2012-01-12 DIAGNOSIS — R51 Headache: Secondary | ICD-10-CM | POA: Insufficient documentation

## 2012-01-12 DIAGNOSIS — Z720 Tobacco use: Secondary | ICD-10-CM

## 2012-01-12 DIAGNOSIS — M25569 Pain in unspecified knee: Secondary | ICD-10-CM

## 2012-01-12 DIAGNOSIS — R519 Headache, unspecified: Secondary | ICD-10-CM | POA: Insufficient documentation

## 2012-01-12 DIAGNOSIS — F17200 Nicotine dependence, unspecified, uncomplicated: Secondary | ICD-10-CM

## 2012-01-12 DIAGNOSIS — I10 Essential (primary) hypertension: Secondary | ICD-10-CM

## 2012-01-12 DIAGNOSIS — F341 Dysthymic disorder: Secondary | ICD-10-CM

## 2012-01-12 LAB — BASIC METABOLIC PANEL
CO2: 24 mEq/L (ref 19–32)
Calcium: 8.9 mg/dL (ref 8.4–10.5)
Chloride: 107 mEq/L (ref 96–112)
Creat: 1.01 mg/dL (ref 0.50–1.35)
Sodium: 138 mEq/L (ref 135–145)

## 2012-01-12 MED ORDER — SERTRALINE HCL 25 MG PO TABS: 25.0000 mg | ORAL_TABLET | Freq: Every day | ORAL | Status: DC

## 2012-01-12 MED ORDER — TRAMADOL HCL 50 MG PO TABS: 50.0000 mg | ORAL_TABLET | Freq: Four times a day (QID) | ORAL | Status: DC | PRN

## 2012-01-12 NOTE — Assessment & Plan Note (Signed)
Pertinent Data:  XR Right knee (06/2007) - Degenerative changes again noted particularly laterally and at the patellofemoral articulation. No fracture is seen. A moderate right knee joint effusion is again noted.  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 Doneen Poisson.   Assessment: Physical exam and history are most consistent with osteoarthritis of the knee. However, he has had total knee replacement in this knee, therefore maybe post operative pain. There are no physical exam findings were historical findings to suggest septic knee. He has specifically not been having any fevers, chills, redness, warmth, swelling of the involved knee.  Plan:      Prescription given for tramadol today - advised to avoid concomittant use of alcohol and to avoid driving, heavy lifting if this medication causes sedation.   Avoiding Tylenol for now given his untreated hepatitis C.  Continue only low doses of when necessary naproxen.  Advised that he must followup with Dr. Magnus Ivan for continued care - particularly in this post surgical knee.

## 2012-01-12 NOTE — Patient Instructions (Signed)
General Instructions:  Please follow-up at the clinic in 3 weeks with you PCP at which time we will reevaluate your depression, knee pain - OR, please follow-up in the clinic sooner if needed.  There have been changes in your medications:  STOP Citalopram  START Sertraline for your depression - stop taking it if you are having worsening depression  START tramadol for your pain - You have been started on a new medication that can cause drowsiness, do not drive or operate heavy machinery . Do not take this medication with alcohol.      If you have been started on new medication(s), and you develop symptoms concerning for allergic reaction, including, but not limited to, throat closing, tongue swelling, rash, please stop the medication immediately and call the clinic at [phone], and go to the ER.  If you are diabetic, please bring your meter to your next visit.  If symptoms worsen, or new symptoms arise, please call the clinic or go to the ER.  PLEASE BRING ALL OF YOUR MEDICATIONS  IN A BAG TO YOUR NEXT APPOINTMENT

## 2012-01-12 NOTE — Assessment & Plan Note (Addendum)
Assessment: Per the patient's history, symptoms seem most consistent with tension headache. These seem to be precipitated only by significant stressors, particularly when his daughter and wife engage in argument. Seems to be appropriately relieved by naproxen and laying down. No neurologic deficits noted on exam. No red flag symptoms that he describes today.  Plan:      Supportive care with when necessary naproxen, advice to take the minimum dosage as possible. Also advised to take with food.  Patient informed of the red flag symptoms that should prompt his immediate return for reevaluation - such as, but not limited to the following: New neurologic deficits, vision changes, any indication of stroke, increased severity of headaches. He expresses understanding of the information provided.  Check BMET today given borderline renal function.

## 2012-01-12 NOTE — Assessment & Plan Note (Signed)
  Assessment: 1. The patient was counseled on the dangers of tobacco use, which include, but are not limited to cardiovascular disease, increased cancer risk of multiple types of cancer, COPD, peripheral vascular disease, strokes. 2. He was also counseled on the benefits of smoking cessation. 3. Progress toward smoking cessation:    now at one pack per day from prior 2 packs per day. Therefore, there has been some improvement. 4. Barriers to progress toward smoking cessation:    stress  Plan: 1. Instruction/counseling given: The patient was firmly advised to quit and referred to a tobacco cessation program.   2. We also reviewed strategies to maximize success, including:  Removing cigarettes and smoking materials from environment  Stress management  Substitution of other forms of reinforcement Support of family/friends.  Selecting a quit date. 3. Educational resources provided: [facility] (1-800-QUIT-NOW) brochure 4. Self management tools provided:   5. Medications to assist with smoking cessation: No medications were prescribed. 6. Patient agreed to the following self-care plans for smoking cessation:

## 2012-01-12 NOTE — Assessment & Plan Note (Signed)
Pertinent Data: BP Readings from Last 3 Encounters:  01/12/12 149/85  10/25/11 143/80  10/05/11 167/75    Basic Metabolic Panel:    Component Value Date/Time   NA 139 10/05/2011 0420   K 4.8 10/05/2011 0420   CL 107 10/05/2011 0420   CO2 24 10/05/2011 0420   BUN 21 10/05/2011 0420   CREATININE 1.18 10/05/2011 0420   CREATININE 1.08 09/23/2011 1150   GLUCOSE 93 10/05/2011 0420   CALCIUM 8.6 10/05/2011 0420    Assessment: Disease Control:  Not controlled  Progress toward goals:  Unchanged  Barriers to meeting goals: no barriers identified    Unfortunately, we did not address this issue today, as the patient had multiple other concerns that required attention. He does not exactly know what medications he is taking.  I called his pharmacy who indicates that the Coreg that he was previously on was last refilled in March 2013. His lisinopril has last been refilled in October 2013 for a 90 day supply.  I spoke with the patient prior to calling his pharmacy, who indicates that he misses his lisinopril about 1-2 times a week at least.  Patient's regimen is unclear. He was previously on Coreg  The patient indicates that he misses his l  However, it seems he is just on a very low dose of Lisinopril, therefore, may benefit from    Plan:  Continue lisinopril for now.  Patient advised to start taking his medication regularly.  I don't see any specific history per his record review that mandates beta blocker usage. However, I will defer to his PCP to determine if Coreg needs to be resumed.  Educational resources provided: brochure  Self management tools provided:

## 2012-01-12 NOTE — Progress Notes (Signed)
Patient: [name]   MRN: [id number]  DOB: 07-Aug-1945  PCP: Lorretta Harp, MD   Subjective:    HPI: [name] is a 66 y.o. male with a PMHx of COPD with ongoing smoking (1 ppd from prior 2ppd), HTN, and hepatitis C who presented to clinic today for the following:  1) HA - Patient describes a 8 days history of unchanged, intermittent,  throbbing pain located on left parietal region. Occuring almost daily, takes aleve for it, which adequately treats the pain. Having more stress at home with frequent arguing between daughter and wife, feels stuck in the middle.   Currently, the pain is rated a 1/10 in severity. Factors that precipitated current episode include: stress. Aggravating factors: stress (but not affected by light, sounds, cold air, defacation, activity). Alleviating factors: OTC NSAIDs (using 2-3 naproxen daily), and feels better with laying in dark room. denies recent trauma, MVA, falls. Admits to associated lightheaded when in the middle of the arguing. Denies associated aura, photophobia  2) Depression - is not well controlled on current therapy - he has been on celexa that is being prescribed by his hepatologist. Associated symptoms include:depressed mood, feelings of worthlessness/guilt, hopelessness, insomnia and moodiness. Denies associated suicidal ideation, homicidal ideation. Currently, the patient does not follow with mental health services.  3) Knee pain bilaterally -  Patient describes a several years history of bilateral knee pain that has been worse in his right knee over the last few months. Patient states that since time of onset, the pain is stable, intermittent, aching. He confirms occasional popping sensation. Has not noticed any swelling, redness, warmth of the involved joint. Admits to associated popping and crepitus sensation. Denies associated foreign body sensation, giving out, locking and swelling. Currently, the pain is rated a 0/10 in severity. Pain is worse  with prolonged activity and  he denies inciting injury, He clarifies that in 07/2011 he did undergo right total knee replacement in the setting of end-stage degenerative. As well, he had surgery on his left knee for septic arthritis in 09/2011.    Review of Systems: Per HPI.   Current Outpatient Medications: Medication Sig  . albuterol (VENTOLIN HFA) 108 (90 BASE) MCG/ACT inhaler Inhale 2 puffs into the lungs every 4 (four) hours as needed. For shortness of breath or wheezing.  Marland Kitchen aspirin 81 MG chewable tablet Chew 81 mg by mouth daily.  . citalopram (CELEXA) 20 MG tablet Take 20 mg by mouth daily.  . Fluticasone-Salmeterol (ADVAIR DISKUS) 250-50 MCG/DOSE AEPB Inhale 1 puff into the lungs 2 (two) times daily.  Marland Kitchen latanoprost (XALATAN) 0.005 % ophthalmic solution Place 1 drop into both eyes at bedtime.  Marland Kitchen lisinopril (PRINIVIL,ZESTRIL) 5 MG tablet Take 1 tablet (5 mg total) by mouth daily.  Marland Kitchen omeprazole (PRILOSEC) 40 MG capsule Take 1 capsule (40 mg total) by mouth daily.  Marland Kitchen tiotropium (SPIRIVA) 18 MCG inhalation capsule Place 1 capsule (18 mcg total) into inhaler and inhale daily.  . naproxen (NAPROSYN) 500 MG tablet Take 1 tablet (500 mg total) by mouth 2 (two) times daily with a meal.    Allergies  Allergen Reactions  . Penicillins Shortness Of Breath and Rash  . Celecoxib     REACTION: strange behavior  . Sulfa Antibiotics   . Sulfonamide Derivatives     REACTION: itching    Past Medical History  Diagnosis Date  . Abnormal liver function tests 01/2010     on hospital admission February 23, 2010 AP = 36, AST =  62, ALT = 40, GGT = 392  . COPD (chronic obstructive pulmonary disease)   . Hypertension   . Hyperlipidemia   . GERD (gastroesophageal reflux disease)   . Anxiety   . Depression   . Pruritus 06/2009     this is most likely secondary to cholestasis, last bilirubin 2.7 (02-25-2010)  . Hepatitis C 2012     a hepatitis panel done February 19, 1998 showed anti-HBc +, anti-HBV +,  hep C ab reactive, February 23, 2010 hepatitis B DNA not detected, HCV RNA  VL = 444000  . Shortness of breath     with exercise  . Cataract   . Glaucoma   . HOH (hard of hearing)     Past Surgical History  Procedure Date  . Open anterior shoulder reconstruction 2003     surgery performed June 26, 2001 by Dr. Rennis Chris, open right shoulder anterior reconstruction secondary to recurrent right shoulder anterior inferior dislocation  . Knee arthroplasty 08/02/2011    Procedure: COMPUTER ASSISTED TOTAL KNEE ARTHROPLASTY;  Surgeon: [name], MD;  Location: [facility] OR;  Service: Orthopedics;  Laterality: Right;  Right total knee arthroplasty  . Knee arthroscopy 10/01/2011    Procedure: ARTHROSCOPY KNEE;  Surgeon: [name], MD;  Location: [facility] ORS;  Service: Orthopedics;  Laterality: Left;  with synevectomy     Objective:    Physical Exam: Filed Vitals:   01/12/12 1031  BP: 149/85  Pulse: 73  Temp: 96.4 F (35.8 C)      General Exam:   Head: Normocephalic, atraumatic.  Eyes: No signs of anemia or jaundince.  Nose: Mucous membranes moist, not inflammed, nonerythematous.  Throat: Oropharynx nonerythematous, no exudate appreciated.   Neck: No deformities, masses, or tenderness noted. Supple, no JVD.  Lungs:  Normal respiratory effort. Clear to auscultation BL without crackles or wheezes.  Heart: RRR. S1 and S2 normal without gallop, murmur, or rubs.  Abdomen:  BS normoactive. Soft, Nondistended, non-tender.  No masses or organomegaly.  Extremities: No pretibial edema. Bilateral knees - no edema, redness, warmth noted over the joint. Crepitation noted with flexion and extension of the knee. Popping heard with extension of the knee.   Skin: No visible rashes, scars.     Neurologic Exam:   Mental Status: Alert, oriented, thought content appropriate.  Speech fluent without evidence of aphasia. Able to follow 3 step commands without difficulty.  Cranial Nerves:   II:  Visual fields grossly intact.  III/IV/VI: Extraocular movements intact.  Pupils reactive bilaterally.  V/VII: Smile symmetric. facial light touch sensation normal bilaterally.  VIII: Grossly intact.  IX/X: Normal gag.  XI: Bilateral shoulder shrug normal.  XII: Midline tongue extension normal.  Motor:  5/5 bilaterally with normal tone and bulk  Sensory:  Light touch intact throughout, bilaterally    Assessment/ Plan:   Case and plan of care discussed with attending physician, Dr. Blanch Media.

## 2012-01-12 NOTE — Assessment & Plan Note (Addendum)
Assessment:  Pt is not currently well controlled on his home medication of Celexa - which he has been on for approximately 2 years via his hepatologist, Dr. Joetta Manners. Denies SI/HI. Other potential contributing factors towards his symptoms include recently escalation of arguments between his daughter and wife.  Plan:  Wills start Sertraline. I am starting a lower dosage in the setting of his liver dysfunction and age.  STOP Celexa.  He shouldn't advised that he must followup with his PCP within the next 2-3 weeks, to assess response to the medication.  Consider escalation of dosage at next visit if having some improvement but not complete stability of symptoms. Patient informed of the red flag symptoms that should prompt his immediate return for reevaluation - such as, but not limited to the following: worsening depressive symptoms, moodiness, suicidal ideation or homicidal ideation. He expresses understanding of the information provided.

## 2012-01-24 ENCOUNTER — Emergency Department (HOSPITAL_COMMUNITY)
Admission: EM | Admit: 2012-01-24 | Discharge: 2012-01-24 | Disposition: A | Discharge status: Home / Self Care | Payer: Medicare Other | Attending: Emergency Medicine | Admitting: Emergency Medicine

## 2012-01-24 ENCOUNTER — Encounter (HOSPITAL_COMMUNITY): Payer: Self-pay | Admitting: *Deleted

## 2012-01-24 DIAGNOSIS — Z7982 Long term (current) use of aspirin: Secondary | ICD-10-CM | POA: Insufficient documentation

## 2012-01-24 DIAGNOSIS — J449 Chronic obstructive pulmonary disease, unspecified: Secondary | ICD-10-CM | POA: Insufficient documentation

## 2012-01-24 DIAGNOSIS — K219 Gastro-esophageal reflux disease without esophagitis: Secondary | ICD-10-CM | POA: Insufficient documentation

## 2012-01-24 DIAGNOSIS — Z8619 Personal history of other infectious and parasitic diseases: Secondary | ICD-10-CM | POA: Insufficient documentation

## 2012-01-24 DIAGNOSIS — F17200 Nicotine dependence, unspecified, uncomplicated: Secondary | ICD-10-CM | POA: Insufficient documentation

## 2012-01-24 DIAGNOSIS — R0602 Shortness of breath: Secondary | ICD-10-CM | POA: Insufficient documentation

## 2012-01-24 DIAGNOSIS — Z79899 Other long term (current) drug therapy: Secondary | ICD-10-CM | POA: Insufficient documentation

## 2012-01-24 DIAGNOSIS — I10 Essential (primary) hypertension: Secondary | ICD-10-CM | POA: Insufficient documentation

## 2012-01-24 DIAGNOSIS — F329 Major depressive disorder, single episode, unspecified: Secondary | ICD-10-CM | POA: Insufficient documentation

## 2012-01-24 DIAGNOSIS — F411 Generalized anxiety disorder: Secondary | ICD-10-CM | POA: Insufficient documentation

## 2012-01-24 DIAGNOSIS — M436 Torticollis: Secondary | ICD-10-CM | POA: Insufficient documentation

## 2012-01-24 DIAGNOSIS — E785 Hyperlipidemia, unspecified: Secondary | ICD-10-CM | POA: Insufficient documentation

## 2012-01-24 DIAGNOSIS — Z8709 Personal history of other diseases of the respiratory system: Secondary | ICD-10-CM | POA: Insufficient documentation

## 2012-01-24 DIAGNOSIS — H409 Unspecified glaucoma: Secondary | ICD-10-CM | POA: Insufficient documentation

## 2012-01-24 DIAGNOSIS — H918X9 Other specified hearing loss, unspecified ear: Secondary | ICD-10-CM | POA: Insufficient documentation

## 2012-01-24 DIAGNOSIS — F3289 Other specified depressive episodes: Secondary | ICD-10-CM | POA: Insufficient documentation

## 2012-01-24 DIAGNOSIS — R51 Headache: Secondary | ICD-10-CM | POA: Insufficient documentation

## 2012-01-24 DIAGNOSIS — J4489 Other specified chronic obstructive pulmonary disease: Secondary | ICD-10-CM | POA: Insufficient documentation

## 2012-01-24 DIAGNOSIS — IMO0002 Reserved for concepts with insufficient information to code with codable children: Secondary | ICD-10-CM | POA: Insufficient documentation

## 2012-01-24 MED ORDER — CYCLOBENZAPRINE HCL 10 MG PO TABS: 10.0000 mg | ORAL_TABLET | Freq: Two times a day (BID) | ORAL | Status: DC | PRN

## 2012-01-24 MED ORDER — HYDROCODONE-ACETAMINOPHEN 5-325 MG PO TABS: 1.0000 | ORAL_TABLET | Freq: Four times a day (QID) | ORAL | Status: DC | PRN

## 2012-01-24 MED ORDER — HYDROCODONE-ACETAMINOPHEN 5-325 MG PO TABS
1.0000 | ORAL_TABLET | Freq: Once | ORAL | Status: AC
  Administered 2012-01-24: 1 via ORAL
  Filled 2012-01-24: qty 1

## 2012-01-24 NOTE — ED Provider Notes (Signed)
History     CSN: [id number]  Arrival date & time 01/24/12  4098   First MD Initiated Contact with Patient 01/24/12 [phone]      Chief Complaint  Patient presents with  . Neck Pain  . Headache    (Consider location/radiation/quality/duration/timing/severity/associated sxs/prior treatment) Patient is a 66 y.o. male presenting with neck pain and headaches. The history is provided by the patient.  Neck Pain  Pertinent negatives include no chest pain and no headaches.  Headache  Associated symptoms include shortness of breath. Pertinent negatives include no fever.   patient with a 3 day history of right next deafness and soreness worse with movement when he coughs it causes shooting pain to shoot up into the side of his head and also if he moves suddenly it causes the same thing. Not improving. Muscle feels tight on that side. Describes the pain at worst is 10 out of 10 at rest without movement or no coughing the pain is 0/10. Patient woke one morning with this stiffness and soreness in the neck. It is only on the right side. Patient does state when the pain gets very severe he feels like he's going to pass out but otherwise and currently he feels fine patient is followed by outpatient clinics.  Past Medical History  Diagnosis Date  . Abnormal liver function tests 01/2010     on hospital admission February 23, 2010 AP = 36, AST = 62, ALT = 40, GGT = 392  . COPD (chronic obstructive pulmonary disease)   . Hypertension   . Hyperlipidemia   . GERD (gastroesophageal reflux disease)   . Anxiety   . Depression   . Pruritus 06/2009     this is most likely secondary to cholestasis, last bilirubin 2.7 (02-25-2010)  . Hepatitis C 2012     a hepatitis panel done February 19, 1998 showed anti-HBc +, anti-HBV +, hep C ab reactive, February 23, 2010 hepatitis B DNA not detected, HCV RNA  VL = 444000  . Shortness of breath     with exercise  . Cataract   . Glaucoma   . HOH (hard of hearing)     Past  Surgical History  Procedure Date  . Open anterior shoulder reconstruction 2003     surgery performed June 26, 2001 by Dr. Rennis Chris, open right shoulder anterior reconstruction secondary to recurrent right shoulder anterior inferior dislocation  . Knee arthroplasty 08/02/2011    Procedure: COMPUTER ASSISTED TOTAL KNEE ARTHROPLASTY;  Surgeon: [name], MD;  Location: [facility] OR;  Service: Orthopedics;  Laterality: Right;  Right total knee arthroplasty  . Knee arthroscopy 10/01/2011    Procedure: ARTHROSCOPY KNEE;  Surgeon: [name], MD;  Location: [facility] ORS;  Service: Orthopedics;  Laterality: Left;  with synevectomy    Family History  Problem Relation Age of Onset  . Stroke Neg Hx   . Cancer Neg Hx     History  Substance Use Topics  . Smoking status: Current Every Day Smoker -- 1.0 packs/day for 49 years    Types: Cigarettes  . Smokeless tobacco: Not on file     Comment: cuttin back from 1.5 pack/day  . Alcohol Use: No      Review of Systems  Constitutional: Negative for fever and diaphoresis.  HENT: Positive for neck pain. Negative for congestion and trouble swallowing.   Respiratory: Positive for cough and shortness of breath.   Cardiovascular: Negative for chest pain.  Gastrointestinal: Negative for abdominal pain.  Genitourinary:  Negative for dysuria.  Musculoskeletal: Negative for back pain.  Skin: Negative for rash.  Neurological: Negative for headaches.  Hematological: Does not bruise/bleed easily.  Psychiatric/Behavioral: Negative for confusion.    Allergies  Penicillins; Celecoxib; Sulfa antibiotics; and Sulfonamide derivatives  Home Medications   Current Outpatient Rx  Name  Route  Sig  Dispense  Refill  . ALBUTEROL SULFATE HFA 108 (90 BASE) MCG/ACT IN AERS   Inhalation   Inhale 2 puffs into the lungs every 4 (four) hours as needed. For shortness of breath or wheezing.   1 Inhaler   5   . ASPIRIN 81 MG PO CHEW   Oral   Chew 81 mg by mouth  daily.         . CHOLESTYRAMINE 4 G PO PACK   Oral   Take 1 packet by mouth 2 (two) times daily.          Marland Kitchen FLUTICASONE-SALMETEROL 250-50 MCG/DOSE IN AEPB   Inhalation   Inhale 1 puff into the lungs 2 (two) times daily.   60 each   5   . LATANOPROST 0.005 % OP SOLN   Both Eyes   Place 1 drop into both eyes at bedtime.         Marland Kitchen LISINOPRIL 5 MG PO TABS   Oral   Take 1 tablet (5 mg total) by mouth daily.   30 tablet   11   . OMEPRAZOLE 40 MG PO CPDR   Oral   Take 1 capsule (40 mg total) by mouth daily.   30 capsule   5   . SERTRALINE HCL 25 MG PO TABS   Oral   Take 1 tablet (25 mg total) by mouth daily.   30 tablet   2   . TIOTROPIUM BROMIDE MONOHYDRATE 18 MCG IN CAPS   Inhalation   Place 1 capsule (18 mcg total) into inhaler and inhale daily.   30 capsule   5   . TRAMADOL HCL 50 MG PO TABS   Oral   Take 1 tablet (50 mg total) by mouth every 6 (six) hours as needed for pain.   40 tablet   0   . CYCLOBENZAPRINE HCL 10 MG PO TABS   Oral   Take 1 tablet (10 mg total) by mouth 2 (two) times daily as needed for muscle spasms.   20 tablet   0   . HYDROCODONE-ACETAMINOPHEN 5-325 MG PO TABS   Oral   Take 1-2 tablets by mouth every 6 (six) hours as needed for pain.   14 tablet   0     BP 175/85  Pulse 76  Temp 97.7 F (36.5 C) (Oral)  Resp 14  SpO2 93%  Physical Exam  Nursing note and vitals reviewed. Constitutional: He is oriented to person, place, and time. He appears well-developed and well-nourished. No distress.  HENT:  Head: Normocephalic and atraumatic.  Mouth/Throat: Oropharynx is clear and moist.  Eyes: Conjunctivae normal and EOM are normal. Pupils are equal, round, and reactive to light.  Neck: No tracheal deviation present.       Tightness to the right trapezius muscle and sternocleidomastoid muscle no tightness or abnormalities on the left side. No adenopathy. Some decreased range of motion due to the tightness of the muscles. Patient  if he holds his head still is perfectly comfortable.  Cardiovascular: Normal rate, regular rhythm, normal heart sounds and intact distal pulses.   No murmur heard. Pulmonary/Chest: Effort normal and breath sounds normal. No  stridor. No respiratory distress. He has no wheezes. He has no rales.  Abdominal: Soft. Bowel sounds are normal. There is no tenderness.  Musculoskeletal: Normal range of motion.  Lymphadenopathy:    He has no cervical adenopathy.  Neurological: He is alert and oriented to person, place, and time. No cranial nerve deficit. He exhibits normal muscle tone. Coordination normal.       Some hearing deficit has hearing aids.  Skin: Skin is warm and dry. No rash noted.    ED Course  Procedures (including critical care time)  Labs Reviewed - No data to display No results found.   1. Torticollis       MDM  Symptoms are consistent with the protocol as. Patient with muscle tightness in the right trapezius muscle and the right sternocleidomastoid mastoid muscle area. Patient only has pain with movement of the neck or with coughing and is only on that side. Patient only has shooting pain with movement or coughing. This seems to be only related to a muscular source. Patient feels fine if he does know neck movement or has no coughing. Patient has only tried tramadol sporadically at home for the pain.        Shelda Jakes, MD 01/24/12 [phone]

## 2012-01-24 NOTE — ED Notes (Signed)
Pt is here with right neck pain and states pain goes up into his head that is sharp which makes him feel like he is going to pass out.  No vision change, no chest pain

## 2012-02-01 ENCOUNTER — Encounter: Payer: Medicare Other | Admitting: Internal Medicine

## 2012-02-08 ENCOUNTER — Other Ambulatory Visit: Payer: Self-pay | Admitting: *Deleted

## 2012-02-08 ENCOUNTER — Encounter: Payer: Self-pay | Admitting: Internal Medicine

## 2012-02-08 ENCOUNTER — Ambulatory Visit (INDEPENDENT_AMBULATORY_CARE_PROVIDER_SITE_OTHER): Payer: Medicare Other | Admitting: Internal Medicine

## 2012-02-08 VITALS — BP 150/92 | HR 77 | Temp 97.2°F | Ht 69.0 in | Wt 233.9 lb

## 2012-02-08 DIAGNOSIS — L299 Pruritus, unspecified: Secondary | ICD-10-CM

## 2012-02-08 DIAGNOSIS — B1920 Unspecified viral hepatitis C without hepatic coma: Secondary | ICD-10-CM

## 2012-02-08 DIAGNOSIS — I10 Essential (primary) hypertension: Secondary | ICD-10-CM

## 2012-02-08 DIAGNOSIS — M25569 Pain in unspecified knee: Secondary | ICD-10-CM

## 2012-02-08 DIAGNOSIS — J449 Chronic obstructive pulmonary disease, unspecified: Secondary | ICD-10-CM

## 2012-02-08 MED ORDER — CHOLESTYRAMINE 4 G PO PACK: 1.0000 | PACK | Freq: Three times a day (TID) | ORAL | Status: AC

## 2012-02-08 MED ORDER — LISINOPRIL 5 MG PO TABS: 10.0000 mg | ORAL_TABLET | Freq: Every day | ORAL | Status: DC

## 2012-02-08 NOTE — Assessment & Plan Note (Signed)
COPD is stable. No signs for acute exacerbation. He does not have chest pain or wheezing. His shortness of breath is at his baseline. His lung auscultation is clear bilaterally. Will continue current regimen.

## 2012-02-08 NOTE — Patient Instructions (Addendum)
1. Please take cholestyramine 3 times a day from now.  2. Please increase your lisinopril dose from 5 mg to 10 mg daily from now.   3. If you have worsening of your symptoms or new symptoms arise, please call the clinic [phone]), or go to the ER immediately if symptoms are severe.  Please do not drink alcohol at any time

## 2012-02-08 NOTE — Progress Notes (Signed)
Patient ID: [name], male   DOB: 04-14-1945, 67 y.o.   MRN: [id number] Patient ID: [name], male   DOB: Oct 20, 1945, 67 y.o.   MRN: [id number]  Subjective:   Patient ID: [name] male   DOB: 1945/09/09 67 y.o.   MRN: [id number]  HPI: Mr.[name] is a 67 y.o. with past medical history as outlined below, who presents for a followup visit.  1.)  COPD: This is stable. The patient is currently taking albuterol, Advair and Spiriva inhalers. He has mild shortness of breath which is at his baseline. He does not have cough, wheezing or chest pain.  3.) HCV: He has been followed up by Dr. Jacqualine Hancock. Last seen was on 10/05/11. He reports that her cholestyramin dosage was decreased to 2 times a day by Dr. Timothy Hancock. Recently his skin itchy comes back. Patient is taking Benadryl which helps partially, but makes him very drowsy. Patient had MRCP on 09/01/10, which showed no evidence of cirrhosis and choledocholithiasis or obstructive mass. Due to the concerning of his chronic obstructive lung disease,  Dr. Jacqualine Hancock did not start new treatment for HCV and suggested to get PFT first.  Patient reports that he did PFT test on 10/201, but did not get results back yet. Patient reports that his previous hepatitis C Dr., Dr. Timothy Hancock left for [facility].  He needs to have a new referral for a new hepatitis C Dr.  3.) HTN: Patient is currently taking lisinopril 5 mg daily. His blood pressure is still not at goal. Today his blood pressure is 144/93 mmHg. He does not have chest pain, shortness of breath, or leg edema.  Past Medical History  Diagnosis Date  . Abnormal liver function tests 01/2010     on hospital admission February 23, 2010 AP = 36, AST = 62, ALT = 40, GGT = 392  . COPD (chronic obstructive pulmonary disease)   . Hypertension   . Hyperlipidemia   . GERD (gastroesophageal reflux disease)   . Anxiety   . Depression   . Pruritus 06/2009     this is most likely secondary to cholestasis, last bilirubin 2.7  (02-25-2010)  . Hepatitis C 2012     a hepatitis panel done February 19, 1998 showed anti-HBc +, anti-HBV +, hep C ab reactive, February 23, 2010 hepatitis B DNA not detected, HCV RNA  VL = 444000  . Shortness of breath     with exercise  . Cataract   . Glaucoma   . HOH (hard of hearing)    Current Outpatient Prescriptions  Medication Sig Dispense Refill  . albuterol (VENTOLIN HFA) 108 (90 BASE) MCG/ACT inhaler Inhale 2 puffs into the lungs every 4 (four) hours as needed. For shortness of breath or wheezing.  1 Inhaler  5  . aspirin 81 MG chewable tablet Chew 81 mg by mouth daily.      . cholestyramine (QUESTRAN) 4 G packet Take 1 packet by mouth 3 (three) times daily with meals.  60 each  10  . cyclobenzaprine (FLEXERIL) 10 MG tablet Take 1 tablet (10 mg total) by mouth 2 (two) times daily as needed for muscle spasms.  20 tablet  0  . Fluticasone-Salmeterol (ADVAIR DISKUS) 250-50 MCG/DOSE AEPB Inhale 1 puff into the lungs 2 (two) times daily.  60 each  5  . HYDROcodone-acetaminophen (NORCO/VICODIN) 5-325 MG per tablet Take 1-2 tablets by mouth every 6 (six) hours as needed for pain.  14 tablet  0  . latanoprost (XALATAN) 0.005 %  ophthalmic solution Place 1 drop into both eyes at bedtime.      Marland Kitchen lisinopril (PRINIVIL,ZESTRIL) 5 MG tablet Take 2 tablets (10 mg total) by mouth daily.  30 tablet  11  . omeprazole (PRILOSEC) 40 MG capsule Take 1 capsule (40 mg total) by mouth daily.  30 capsule  5  . sertraline (ZOLOFT) 25 MG tablet Take 1 tablet (25 mg total) by mouth daily.  30 tablet  2  . tiotropium (SPIRIVA) 18 MCG inhalation capsule Place 1 capsule (18 mcg total) into inhaler and inhale daily.  30 capsule  5  . traMADol (ULTRAM) 50 MG tablet Take 1 tablet (50 mg total) by mouth every 6 (six) hours as needed for pain.  40 tablet  0   Family History  Problem Relation Age of Onset  . Stroke Neg Hx   . Cancer Neg Hx    History   Social History  . Marital Status: Legally Separated    Spouse  Name: N/A    Number of Children: N/A  . Years of Education: N/A   Social History Main Topics  . Smoking status: Current Every Day Smoker -- 1.0 packs/day for 49 years    Types: Cigarettes  . Smokeless tobacco: None     Comment: cuttin back from 1.5 pack/day  . Alcohol Use: No  . Drug Use: No  . Sexually Active: None   Other Topics Concern  . None   Social History Narrative  . None   Review of Systems:  General: no fevers, chills, no changes in body weight, no changes in appetite Skin: no rash HEENT: no blurry vision, hearing changes or sore throat Pulm: has mild SOB. No coughing or wheezing CV: no chest pain, palpitations, shortness of breath Abd: no nausea/vomiting, abdominal pain, diarrhea/constipation GU: no dysuria, hematuria, polyuria Ext: mild knee pain bilaterally.  Neuro: no weakness, numbness, or tingling  Objective:  Physical Exam: Filed Vitals:   02/08/12 1105 02/08/12 1155  BP: 144/93 150/92  Pulse: 80 77  Temp: 97.2 F (36.2 C)   TempSrc: Oral   Height: 5\' 9"  (1.753 m)   Weight: 233 lb 14.4 oz (106.096 kg)   SpO2: 98%    General: resting in bed, not in acute distress HEENT: PERRL, EOMI, no scleral icterus Cardiac: S1/S2, RRR, No murmurs, gallops or rubs Pulm: Good air movement bilaterally, Clear to auscultation bilaterally, No rales, wheezing, rhonchi or rubs. Abd: Soft,  nondistended, nontender, no rebound pain, no organomegaly, BS present Ext: No rashes. 2+DP/PT pulse bilaterally.  Musculoskeletal: No joint deformities, erythema, or stiffness, ROM full and no nontender Skin: no rashes. No skin bruise. Neuro: alert and oriented X3, cranial nerves II-XII grossly intact, muscle strength 5/5 in all extremeties,  sensation to light touch intact.  Psych.: patient is not psychotic, no suicidal or hemocidal ideation.   Assessment & Plan:

## 2012-02-08 NOTE — Assessment & Plan Note (Signed)
BP Readings from Last 3 Encounters:  02/08/12 150/92  01/24/12 175/85  01/12/12 149/85    Lab Results  Component Value Date   NA 138 01/12/2012   K 4.1 01/12/2012   CREATININE 1.01 01/12/2012    Assessment:  Blood pressure control: mildly elevated  Progress toward BP goal:  unchanged  Comments:   Plan:  Medications:  will increase his lisinopril dosage to 10 mg daily.   Educational resources provided: brochure  Self management tools provided: home blood pressure logbook  Other plans: Patient's blood pressure is not at goal. Will increased his lisinopril dosage to 10 mg daily and check his renal function in one month.

## 2012-02-08 NOTE — Assessment & Plan Note (Signed)
Patient had a viral load of 444000 on 02/24/10, but his liver function since he is stable. His AST was 23 and ALT was 15 on 09/23/11. Patient did not get any treatment because of concerning of his pulmonary function. Recently he did on her function test.   -Will get PFT test results.  -We will give him referral to hepatitis C clinic. - will increase cholestyramine to 3 times a day at the same does for his skin itches.

## 2012-02-09 MED ORDER — HYDROCODONE-ACETAMINOPHEN 5-325 MG PO TABS: 1.0000 | ORAL_TABLET | Freq: Four times a day (QID) | ORAL | Status: DC | PRN

## 2012-02-09 NOTE — Telephone Encounter (Signed)
Last refill 12/31 for # 14 Pt seen in clinic yesterday and forgot to ask for pain med

## 2012-02-10 NOTE — Telephone Encounter (Signed)
Rx called in to pharmacy 02/10/12 10AM. Stanton Kidney Eissa Buchberger RN 02/10/12 10AM

## 2012-02-27 ENCOUNTER — Telehealth: Payer: Self-pay | Admitting: *Deleted

## 2012-02-27 NOTE — Telephone Encounter (Signed)
Pt's spouse calls and request appt stating pt has a cough x 1week with fever occasionally appt given 2/4 with dr Clyde Lundborg

## 2012-02-28 ENCOUNTER — Encounter: Payer: Self-pay | Admitting: Internal Medicine

## 2012-02-28 ENCOUNTER — Ambulatory Visit (INDEPENDENT_AMBULATORY_CARE_PROVIDER_SITE_OTHER): Payer: Medicare Other | Admitting: Internal Medicine

## 2012-02-28 VITALS — BP 133/86 | HR 84 | Temp 97.4°F | Ht 69.0 in | Wt 231.0 lb

## 2012-02-28 DIAGNOSIS — R05 Cough: Secondary | ICD-10-CM | POA: Insufficient documentation

## 2012-02-28 DIAGNOSIS — F17200 Nicotine dependence, unspecified, uncomplicated: Secondary | ICD-10-CM

## 2012-02-28 DIAGNOSIS — R059 Cough, unspecified: Secondary | ICD-10-CM

## 2012-02-28 DIAGNOSIS — Z720 Tobacco use: Secondary | ICD-10-CM

## 2012-02-28 DIAGNOSIS — J441 Chronic obstructive pulmonary disease with (acute) exacerbation: Secondary | ICD-10-CM

## 2012-02-28 DIAGNOSIS — I10 Essential (primary) hypertension: Secondary | ICD-10-CM

## 2012-02-28 LAB — BASIC METABOLIC PANEL WITH GFR
CO2: 24 mEq/L (ref 19–32)
Chloride: 110 mEq/L (ref 96–112)
Creat: 1.01 mg/dL (ref 0.50–1.35)
GFR, Est Non African American: 77 mL/min
Potassium: 4.5 mEq/L (ref 3.5–5.3)

## 2012-02-28 MED ORDER — PREDNISONE (PAK) 10 MG PO TABS: ORAL_TABLET | ORAL | Status: DC

## 2012-02-28 MED ORDER — AZITHROMYCIN 250 MG PO TABS: ORAL_TABLET | ORAL | Status: AC

## 2012-02-28 MED ORDER — NICOTINE 21 MG/24HR TD PT24: 1.0000 | MEDICATED_PATCH | TRANSDERMAL | Status: DC

## 2012-02-28 MED ORDER — HYDROCOD POLST-CHLORPHEN POLST 10-8 MG/5ML PO LQCR: 5.0000 mL | Freq: Every evening | ORAL | Status: DC | PRN

## 2012-02-28 MED ORDER — GUAIFENESIN ER 600 MG PO TB12: 600.0000 mg | ORAL_TABLET | Freq: Two times a day (BID) | ORAL | Status: AC

## 2012-02-28 NOTE — Assessment & Plan Note (Signed)
  Assessment:  Progress toward smoking cessation:  smoking the same amount (patient is convinced by me to try to cut down or quit smokin)  Barriers to progress toward smoking cessation:  lack of motivation to quit;lack of understanding of harms of tobacco  Comments:  Plan:  Instruction/counseling given:  I counseled patient on the dangers of tobacco use.  Educational resources provided:  [employer]) brochure  Self management tools provided:     Medications to assist with smoking cessation:  Nicotine Patch Patient agreed to the following self-care plans for smoking cessation:      Other: Patient finally agrees to try to cut down his smoking and possibly quit smoking. Nicotine patch prescription was given. Patient will be given referral to social worker.

## 2012-02-28 NOTE — Progress Notes (Signed)
Patient ID: [name], male   DOB: 11-11-1945, 67 y.o.   MRN: [id number] Subjective:   Patient ID: [name] male   DOB: Oct 10, 1945 67 y.o.   MRN: [id number]  CC:    Acute visit due to cough HPI:  Mr.[name] is a 67 y.o. man with past medical history as outlined below, who present for an acute visit today.  He reports that he started having cough for 6 days. The cough is productive. He coughs up 2 teaspoonful of sputum which was initially white colored and then changed to yellow-colored sputum. He did not noticed any blood in the sputum. He states that coughing makes her muscle pain below his rib cage bilaterally. He has mild shortness of breath at the baseline because of COPD, which has not been getting worse. He feels slightly better since his cough started. He denies the symptoms for cold, such as runny nose, sore throat, headache, fever or chills. She tried some over-the-counter Mucinex at home without significant improvement. He does not have chest pain and palpitation.  Off note, patient has COPD. He continues to smoke. He has been smoked one pack a day for more than 45 years. Today he feels like that he is ready to cut down his smoking. He wanst to try nicotine patch when he tries to quit or cut down his smoking.  In previous visit, the patient's blood pressure medication, lisinopril dosage was increased from 5 mg daily to 10 mg daily. Today his blood pressure is normal.  Denies fever, chills, fatigue, headaches, abdominal pain,diarrhea, constipation, dysuria, urgency, frequency, hematuria.   Past Medical History  Diagnosis Date  . Abnormal liver function tests 01/2010     on hospital admission February 23, 2010 AP = 36, AST = 62, ALT = 40, GGT = 392  . COPD (chronic obstructive pulmonary disease)   . Hypertension   . Hyperlipidemia   . GERD (gastroesophageal reflux disease)   . Anxiety   . Depression   . Pruritus 06/2009     this is most likely secondary to cholestasis,  last bilirubin 2.7 (02-25-2010)  . Hepatitis C 2012     a hepatitis panel done February 19, 1998 showed anti-HBc +, anti-HBV +, hep C ab reactive, February 23, 2010 hepatitis B DNA not detected, HCV RNA  VL = 444000  . Shortness of breath     with exercise  . Cataract   . Glaucoma   . HOH (hard of hearing)    Current Outpatient Prescriptions  Medication Sig Dispense Refill  . albuterol (VENTOLIN HFA) 108 (90 BASE) MCG/ACT inhaler Inhale 2 puffs into the lungs every 4 (four) hours as needed. For shortness of breath or wheezing.  1 Inhaler  5  . aspirin 81 MG chewable tablet Chew 81 mg by mouth daily.      Marland Kitchen azithromycin (ZITHROMAX Z-PAK) 250 MG tablet Take 2 tablets (500 mg) on  Day 1,  followed by 1 tablet (250 mg) once daily on Days 2 through 5.  6 each  0  . chlorpheniramine-HYDROcodone (TUSSIONEX) 10-8 MG/5ML LQCR Take 5 mLs by mouth at bedtime as needed.  140 mL  1  . cholestyramine (QUESTRAN) 4 G packet Take 1 packet by mouth 3 (three) times daily with meals.  60 each  10  . cyclobenzaprine (FLEXERIL) 10 MG tablet Take 1 tablet (10 mg total) by mouth 2 (two) times daily as needed for muscle spasms.  20 tablet  0  . Fluticasone-Salmeterol (ADVAIR DISKUS)  250-50 MCG/DOSE AEPB Inhale 1 puff into the lungs 2 (two) times daily.  60 each  5  . guaiFENesin (MUCINEX) 600 MG 12 hr tablet Take 1 tablet (600 mg total) by mouth 2 (two) times daily.  60 tablet  2  . HYDROcodone-acetaminophen (NORCO/VICODIN) 5-325 MG per tablet Take 1-2 tablets by mouth every 6 (six) hours as needed for pain.  14 tablet  0  . latanoprost (XALATAN) 0.005 % ophthalmic solution Place 1 drop into both eyes at bedtime.      Marland Kitchen lisinopril (PRINIVIL,ZESTRIL) 5 MG tablet Take 2 tablets (10 mg total) by mouth daily.  30 tablet  11  . nicotine (NICODERM CQ - DOSED IN MG/24 HOURS) 21 mg/24hr patch Place 1 patch onto the skin daily.  30 patch  2  . omeprazole (PRILOSEC) 40 MG capsule Take 1 capsule (40 mg total) by mouth daily.  30  capsule  5  . predniSONE (STERAPRED UNI-PAK) 10 MG tablet Please take 30 mg today, 2/5, 2/6; and then 20 mg on 2/7, 2/8 and 2/9, then 10 mg on 2/10, 2/11 and 03/07/12; then stop.  18 tablet  0  . sertraline (ZOLOFT) 25 MG tablet Take 1 tablet (25 mg total) by mouth daily.  30 tablet  2  . tiotropium (SPIRIVA) 18 MCG inhalation capsule Place 1 capsule (18 mcg total) into inhaler and inhale daily.  30 capsule  5  . traMADol (ULTRAM) 50 MG tablet Take 1 tablet (50 mg total) by mouth every 6 (six) hours as needed for pain.  40 tablet  0   Family History  Problem Relation Age of Onset  . Stroke Neg Hx   . Cancer Neg Hx    History   Social History  . Marital Status: Legally Separated    Spouse Name: N/A    Number of Children: N/A  . Years of Education: N/A   Social History Main Topics  . Smoking status: Current Every Day Smoker -- 1.0 packs/day for 49 years    Types: Cigarettes  . Smokeless tobacco: None     Comment: cuttin back from 1.5 pack/day  . Alcohol Use: No  . Drug Use: No  . Sexually Active: None   Other Topics Concern  . None   Social History Narrative  . None    Review of Systems: as per HPI, other finding include skin itches.  Objective:  Physical Exam: Filed Vitals:   02/28/12 1530  BP: 133/86  Pulse: 84  Temp: 97.4 F (36.3 C)  TempSrc: Oral  Height: 5\' 9"  (1.753 m)  Weight: 231 lb (104.781 kg)  SpO2: 99%   Constitutional: Vital signs reviewed.  Patient is a well-developed and well-nourished, in no acute distress and cooperative with exam.   HEENT:  Head: Normocephalic and atraumatic Ear: TM normal bilaterally Mouth: no erythema or exudates, MMM Eyes: PERRL, EOMI, conjunctivae normal, No scleral icterus.  Neck: Supple, Trachea midline normal ROM, No JVD, mass, thyromegaly, or carotid bruit present. No lymph node enlargement. Cardiovascular: RRR, S1 normal, S2 normal, no MRG, pulses symmetric and intact bilaterally Pulmonary/Chest: Good air movement  bilaterally, there is diffuse expiratory rhonchi bilaterally. There is no rales, wheezing or rubs. There is mild tenderness over his lower chest wall bilaterally below rib cage. Abdominal: Soft. Non-tender, non-distended, bowel sounds are normal, no masses, organomegaly, or guarding present.  GU: no CVA tenderness Musculoskeletal: No joint deformities, erythema, or stiffness, ROM full and non-tender Hematology: no cervical, inginal, or axillary adenopathy.  Neurological:  A&O x3, Strength is normal and symmetric bilaterally, cranial nerve II-XII are grossly intact, no focal motor deficit, sensory intact to light touch bilaterally. Brachial reflex 2+ bilaterally. Knee reflex 2+ bilaterally. Babinski's sign negative. Finger to nose test normal. Skin: Warm, dry and intact. No rash, cyanosis Psychiatric: Normal mood and affect. speech and behavior is normal. Judgment and thought content normal. Cognition and memory are normal.   Assessment & Plan:

## 2012-02-28 NOTE — Assessment & Plan Note (Signed)
Patient has severe productive cough. He has diffuse bilateral rhonchi on lung auscultation. Given his COPD, it is likely that patient has mild COPD exacerbation. Currently patient's oxygen saturation is normal at 99% on room air. Patient does not have signs for pneumonia given no fever, chills and a chest pain.  -Will treat patient symptomatically with tussionux and the Mucinex -will treat with tapering dose of prednisone for 9 days and  Z-Pak for five-day.

## 2012-02-28 NOTE — Assessment & Plan Note (Signed)
BP Readings from Last 3 Encounters:  02/28/12 133/86  02/08/12 150/92  01/24/12 175/85    Lab Results  Component Value Date   NA 138 01/12/2012   K 4.1 01/12/2012   CREATININE 1.01 01/12/2012    Assessment:  Blood pressure control: controlled  Progress toward BP goal:  at goal  Comments:  Plan:  Medications:  continue current medications  Educational resources provided: brochure  Self management tools provided: instructions for home blood pressure monitoring  Other plans: Patient's blood pressure is normal today. Since we recently increased his lisinopril dosage from 5 mg daily to 10 mg daily, will check his BMP to make sure his creatinine is OK.

## 2012-02-28 NOTE — Patient Instructions (Signed)
1. Please takeTusionex, Zithromax and prednisone as prescribed for your cough. Please use nicotine patch when you quit smoking.  2. Please take all medications as prescribed.  3. If you have worsening of your symptoms or new symptoms arise, please call the clinic ([phone]), or go to the ER immediately if symptoms are severe.  You have done great job in taking all your medications. I appreciate it very much. Please continue doing that.

## 2012-03-13 ENCOUNTER — Encounter: Payer: Medicare Other | Admitting: Internal Medicine

## 2012-03-16 ENCOUNTER — Telehealth: Payer: Self-pay | Admitting: Licensed Clinical Social Worker

## 2012-03-16 NOTE — Telephone Encounter (Signed)
Mr. Pulido was referred to CSW for smoking cessation.  CSW placed called to pt. Male family member answered.  CSW left message requesting return call. CSW provided contact hours and phone number.

## 2012-03-23 NOTE — Telephone Encounter (Signed)
During this telephone call, pt did not want to come to phone.  However, spouse states pt has increased agitation and irritability, "jumpiness and upset".  Spouse states pt went to [facility], who prescribed two medications for pt's itchiness".  Pt could only afford one of the medications, they could not afford "cortisone".  Spouse inquiring if there is something more affordable that would not make pt drowsy.  CSW encourage pt to schedule an appt with PCP to discuss concerns, as CSW will route note to PCP.  CSW offered to schedule psychiatry appt to discuss what spouse calls "nervous problem, fidgeting".  Spouse declines at this time and would like to wait and see how the recently prescribed "itching" medication works.  CSW encouraged spouse of resources available, ie: [facility], [facility] as needed.  Spouse grateful and has CSW contact information and will notify CSW when they are ready for additional resources.  Pt still smoking, as smoking cessation does not appear to be his priority at this time.

## 2012-03-23 NOTE — Telephone Encounter (Signed)
CSW placed called to pt.  CSW left message requesting return call. CSW provided contact hours and phone number. 

## 2012-04-03 ENCOUNTER — Other Ambulatory Visit: Payer: Self-pay | Admitting: Internal Medicine

## 2012-04-03 DIAGNOSIS — B182 Chronic viral hepatitis C: Secondary | ICD-10-CM

## 2012-04-06 ENCOUNTER — Other Ambulatory Visit: Payer: Self-pay | Admitting: Internal Medicine

## 2012-04-09 ENCOUNTER — Other Ambulatory Visit: Payer: Self-pay | Admitting: Internal Medicine

## 2012-04-09 ENCOUNTER — Ambulatory Visit
Admission: RE | Admit: 2012-04-09 | Discharge: 2012-04-09 | Disposition: A | Discharge status: Home / Self Care | Payer: Medicare Other | Source: Ambulatory Visit | Attending: Internal Medicine | Admitting: Internal Medicine

## 2012-04-09 DIAGNOSIS — B182 Chronic viral hepatitis C: Secondary | ICD-10-CM

## 2012-04-20 ENCOUNTER — Other Ambulatory Visit: Payer: Self-pay | Admitting: Internal Medicine

## 2012-04-20 DIAGNOSIS — K219 Gastro-esophageal reflux disease without esophagitis: Secondary | ICD-10-CM

## 2012-04-24 ENCOUNTER — Telehealth: Payer: Self-pay | Admitting: *Deleted

## 2012-04-24 NOTE — Telephone Encounter (Signed)
Please clarify the script for questran, the pharmacy has called

## 2012-05-06 ENCOUNTER — Other Ambulatory Visit: Payer: Self-pay | Admitting: Internal Medicine

## 2012-05-06 DIAGNOSIS — F329 Major depressive disorder, single episode, unspecified: Secondary | ICD-10-CM

## 2012-05-10 ENCOUNTER — Other Ambulatory Visit: Payer: Self-pay | Admitting: Internal Medicine

## 2012-05-10 DIAGNOSIS — N2889 Other specified disorders of kidney and ureter: Secondary | ICD-10-CM

## 2012-05-16 ENCOUNTER — Other Ambulatory Visit: Payer: Self-pay | Admitting: Internal Medicine

## 2012-05-16 LAB — BUN: BUN: 17 mg/dL (ref 6–23)

## 2012-05-16 LAB — CREATININE, SERUM: Creat: 0.9 mg/dL (ref 0.50–1.35)

## 2012-05-18 ENCOUNTER — Ambulatory Visit
Admission: RE | Admit: 2012-05-18 | Discharge: 2012-05-18 | Disposition: A | Discharge status: Home / Self Care | Payer: Medicare Other | Source: Ambulatory Visit | Attending: Internal Medicine | Admitting: Internal Medicine

## 2012-05-18 DIAGNOSIS — N2889 Other specified disorders of kidney and ureter: Secondary | ICD-10-CM

## 2012-05-18 MED ORDER — IOHEXOL 350 MG/ML SOLN
125.0000 mL | Freq: Once | INTRAVENOUS | Status: AC | PRN
  Administered 2012-05-18: 125 mL via INTRAVENOUS

## 2012-05-28 ENCOUNTER — Ambulatory Visit (INDEPENDENT_AMBULATORY_CARE_PROVIDER_SITE_OTHER): Payer: Medicare Other | Admitting: Internal Medicine

## 2012-05-28 ENCOUNTER — Encounter: Payer: Self-pay | Admitting: Internal Medicine

## 2012-05-28 VITALS — BP 141/80 | HR 78 | Temp 97.3°F | Ht 69.0 in | Wt 235.3 lb

## 2012-05-28 DIAGNOSIS — J4489 Other specified chronic obstructive pulmonary disease: Secondary | ICD-10-CM

## 2012-05-28 DIAGNOSIS — F17200 Nicotine dependence, unspecified, uncomplicated: Secondary | ICD-10-CM

## 2012-05-28 DIAGNOSIS — J449 Chronic obstructive pulmonary disease, unspecified: Secondary | ICD-10-CM

## 2012-05-28 DIAGNOSIS — Z720 Tobacco use: Secondary | ICD-10-CM

## 2012-05-28 DIAGNOSIS — I10 Essential (primary) hypertension: Secondary | ICD-10-CM

## 2012-05-28 DIAGNOSIS — L299 Pruritus, unspecified: Secondary | ICD-10-CM

## 2012-05-28 DIAGNOSIS — R7402 Elevation of levels of lactic acid dehydrogenase (LDH): Secondary | ICD-10-CM

## 2012-05-28 MED ORDER — HYDROXYZINE HCL 10 MG PO TABS: 10.0000 mg | ORAL_TABLET | Freq: Three times a day (TID) | ORAL | Status: DC | PRN

## 2012-05-28 NOTE — Assessment & Plan Note (Signed)
Stable COPD. PFTs done in November 2013- mild for limitation with normal lung volumes and normal diffusion capacity. - Oxygen saturation stable today. - Not on home O2. - Continue Advair, Spiriva and as needed albuterol.

## 2012-05-28 NOTE — Assessment & Plan Note (Signed)
Unclear etiology.  Does not have uremia.  Does have fatty infiltration of liver with liver transaminases. Also does not have hyperbilirubinemia. - Has been treated conservatively with bile acid sequestrant cholestyramine with improvement in past. - Continue cholestyramine and at hydroxyzine temporarily. Reevaluate if no improvement or worsening of symptoms.

## 2012-05-28 NOTE — Assessment & Plan Note (Signed)
Recent CT abdomen shows fatty infiltration of liver no cirrhosis. - Patient has itching of unclear etiology. - Will continue cholestyramine and add hydroxyzine for short-term and reevaluate.

## 2012-05-28 NOTE — Assessment & Plan Note (Signed)
Still smokes one pack a day. Says he has cut down from 2 packs a day to one pack a day.

## 2012-05-28 NOTE — Patient Instructions (Signed)
Please make a followup appointment in 6-8 weeks or earlier if needed.  Starting hydroxyzine 10 mg up to 3 times a day and cholestyramine 3 times a day for itching. Continue using inhalers as you do for COPD.  Keep taking all medications regularly.

## 2012-05-28 NOTE — Assessment & Plan Note (Signed)
BP Readings from Last 3 Encounters:  05/28/12 141/80  02/28/12 133/86  02/08/12 150/92    Lab Results  Component Value Date   NA 139 02/28/2012   K 4.5 02/28/2012   CREATININE 0.90 05/16/2012    Assessment: Blood pressure control:   fair Progress toward BP goal:    mild worsening Comments:   Plan: Medications:  continue current medications, Continue lisinopril 10 mg daily. No new changes today. Educational resources provided:   Self management tools provided:   Other plans: No new changes.

## 2012-05-28 NOTE — Progress Notes (Signed)
I have discussed this case with Dr. Patel soon after patient visit , read the documentation and I agree with the plan of care provided. Please see the resident note for details of management.  

## 2012-05-28 NOTE — Progress Notes (Signed)
  Subjective:    Patient ID: [name], male    DOB: 03/26/1945, 67 y.o.   MRN: [id number]  HPI patient is a 67 year old man with history of hypertension, COPD, any transaminases, hep C and other problems as per problem list who comes to the clinic for followup visit.  He reports some shortness of breath with wheezing. Does not have cough or productive sputum. He denies any fever, chills, chest pain, nausea vomiting, abdominal pain, diarrhea.  He also reports itching all over his body which he is going through for a while now.  cholestyramine 3 times a day helped him before, but then it was cut down to twice a day and now he is itching more. Also she was prescribed hydroxyzine but he did not get a refill due to some authorization issues.     Review of Systems     as per history of present illness.  Objective:   Physical Exam  General: NAD HEENT: PERRL, EOMI, no scleral icterus Cardiac: S1, S2, RRR, no rubs, murmurs or gallops Pulm: Fine end expiratory wheezes bilaterally, moving normal volumes of air. Abd: soft, nontender, nondistended, BS present Ext: warm and well perfused, no pedal edema Neuro: alert and oriented X3, cranial nerves II-XII grossly intact       Assessment & Plan:

## 2012-05-30 ENCOUNTER — Telehealth: Payer: Self-pay | Admitting: *Deleted

## 2012-05-30 NOTE — Telephone Encounter (Signed)
Call from pt - stated "itching medication" cost too much. I called [facility] - stated $36.35 w/discount card for #90 tabs. Pt/spouse informed.

## 2012-06-12 NOTE — Telephone Encounter (Signed)
Contacted pt's insurance at [phone].  Insurance has agree to pay for rx unitl 06/12/2013. Patient and pharmacy informed.Criss Alvine, Darlene Cassady5/20/201411:21 AM

## 2012-07-31 ENCOUNTER — Other Ambulatory Visit: Payer: Self-pay | Admitting: Internal Medicine

## 2012-07-31 DIAGNOSIS — I10 Essential (primary) hypertension: Secondary | ICD-10-CM

## 2012-09-07 ENCOUNTER — Other Ambulatory Visit: Payer: Self-pay | Admitting: *Deleted

## 2012-09-07 DIAGNOSIS — J449 Chronic obstructive pulmonary disease, unspecified: Secondary | ICD-10-CM

## 2012-09-07 MED ORDER — FLUTICASONE-SALMETEROL 250-50 MCG/DOSE IN AEPB: 1.0000 | INHALATION_SPRAY | Freq: Two times a day (BID) | RESPIRATORY_TRACT | Status: DC

## 2012-09-14 ENCOUNTER — Ambulatory Visit (INDEPENDENT_AMBULATORY_CARE_PROVIDER_SITE_OTHER): Payer: Medicare Other | Admitting: Internal Medicine

## 2012-09-14 VITALS — BP 166/85 | HR 64 | Temp 96.9°F | Ht 69.0 in | Wt 239.8 lb

## 2012-09-14 DIAGNOSIS — F17200 Nicotine dependence, unspecified, uncomplicated: Secondary | ICD-10-CM | POA: Insufficient documentation

## 2012-09-14 DIAGNOSIS — Z23 Encounter for immunization: Secondary | ICD-10-CM

## 2012-09-14 DIAGNOSIS — I10 Essential (primary) hypertension: Secondary | ICD-10-CM

## 2012-09-14 DIAGNOSIS — R51 Headache: Secondary | ICD-10-CM

## 2012-09-14 DIAGNOSIS — J449 Chronic obstructive pulmonary disease, unspecified: Secondary | ICD-10-CM | POA: Insufficient documentation

## 2012-09-14 DIAGNOSIS — F329 Major depressive disorder, single episode, unspecified: Secondary | ICD-10-CM | POA: Insufficient documentation

## 2012-09-14 DIAGNOSIS — Z9119 Patient's noncompliance with other medical treatment and regimen: Secondary | ICD-10-CM | POA: Insufficient documentation

## 2012-09-14 LAB — LIPID PANEL
HDL: 40 mg/dL (ref >39)
LDL Cholesterol: 56 mg/dL (ref 0–99)

## 2012-09-14 LAB — LDL CHOLESTEROL, DIRECT: Direct LDL: 65 mg/dL

## 2012-09-14 MED ORDER — FLUOXETINE HCL (PMDD) 20 MG PO CAPS: 20.0000 mg | ORAL_CAPSULE | Freq: Every day | ORAL | Status: DC

## 2012-09-14 NOTE — Patient Instructions (Addendum)
It was a pleasure to meet you today! - It is very important that you take your medication every day. - We would like for you to record when you take your blood pressure medication and your Blood pressure throughout the day in the log we have given you. - We should stop taking your Sertraline and we will prescribe you a new medication called Fluoxetine. Take this medication as prescribed once daily. - We will need to see you again in 2 weeks with your logs to make any changes that need to be made.

## 2012-09-14 NOTE — Progress Notes (Signed)
Subjective:     Patient ID: [name], male   DOB: 04-21-1945, 67 y.o.   MRN: [id number]  Nurse Notes Anxiety Symptoms include dizziness. Patient reports no chest pain, palpitations or shortness of breath.    HPI [name] comes in for a follow up on hypertension and depression. He claims that both his problems are poorly controlled.  For hypertension, he is on lisinopril 10 mg which he takes about 2-3 times a week, and misses the rest of the days. He says that he misses the tablet when he sleeps out of his house and does not have his medicine with him. He also fears that taking too much medicine will drop his blood pressure too much.  For his depression he in on sertraline, and he thinks that the medicine does not suit him much. He has anger fits after taking the medicine and gets very loopy if he does not take the medicine or miss it on a single day. He otherwise reports no other specific complaints.   Review of Systems  Constitutional: Negative for chills, activity change and fatigue.  HENT: Negative for facial swelling.   Eyes: Negative for visual disturbance.  Respiratory: Negative for chest tightness, shortness of breath and wheezing.   Cardiovascular: Negative for chest pain, palpitations and leg swelling.  Gastrointestinal: Negative for abdominal pain, diarrhea and constipation.  Endocrine: Negative for polyuria.  Musculoskeletal: Negative for myalgias, joint swelling and arthralgias.  Neurological: Positive for dizziness and headaches. Negative for tremors, seizures, syncope, speech difficulty and weakness.  Psychiatric/Behavioral: Negative for behavioral problems and agitation.       Objective:   Physical Exam General: No acute distress. African American.  HEENT: PERRL, EOMI.   CV: S1S2 RRR, no murmur Lungs: Bilateral Vesicular breath sounds.  Abdomen: Soft, non-tender, benign Pedal Edema: none Neuro: Alert and oriented times 3. No gross deficits seen.       Assessment & Plan:     Uncontrolled hypertension - Non-compliance with current medications seems to be the barrier in achieving optimum blood pressure goals. Dizziness and headaches can be attributed to the high blood pressures. Denies visual changes. Orthostatics are objectively negative but symptomatically positive. We would ask the patient to continue lisinopril 10 mg and take it consistently every day for 2 weeks and come in for a re-evaluation. He will bring a blood pressure log with him.We will do a lipid profile today as it has not been done in 2 years.  Depression - We will change sertraline to fluoxetine 20 mg daily. Impressed upon the patient to take the medicine daily.   Non-compliance - Counseling done for 10 minutes. Medicines and dosages reviewed with patient. The patient affirms that he would take the medication daily.   Return to clinic in 2 weeks for HTN reevaluation.

## 2012-09-14 NOTE — Progress Notes (Signed)
This is a Psychologist, occupational Note.  The care of the patient was discussed with Dr. Dalphine Handing and the assessment and plan was formulated with their assistance.  Please see their note for official documentation of the patient encounter.       Subjective:    Patient ID: [name], male   DOB: 1945/06/21, 67 y.o.   MRN: [id number]  HPI: [name] is a 67 yo M with past medical history significant for essential hypertension, COPD, Hepatitis C, GERD and depression who presents to clinic for evaluation of high blood pressure, dizziness and depression. He reports that he has noticed his blood pressure being elevated consistently and began measuring it at home with a BP monitor. His BP meter reveals ranges of (110-180)/(85-115) with a pulse ranging from 60-100. He states that when his blood pressure is elevated he occasionally feels a pulsation type headache that is present in a circular fashion around his head. He has not attempted to take medications to alleviate symptoms but reports he usually lays down to and tries to go to sleep to help. Nothing makes the pain worse. He rates the pain 5/10 and states that it usually occurs when his blood pressure is elevated. He reports that he occasionally feels dizzy when he stands up quickly and that usually occurs also when his blood pressure is elevated. HE describes the dizziness as light headed. He denies LOC ever, tinnitus, N/V, fevers, chills or other constitutional symptoms. He also reports not taking his lisinopril or other medications 3-4X/week because he wakes up late and fears that he will take too much medication if he takes it as scheduled the next day.   [name] also complains of increased depression recently. He is adamant that his sertraline does not work and reports that his wife agrees. He reports that he occasionally also misses doses of sertraline as well. He describes feeling depressed about growing older and not being useful any longer. He also reports  that his wife and daughter argue constantly and he feels guilty spending more time with one than the other. He reports that he would like to be switched to a different medication.   Review of Systems  Constitutional: Negative.   Eyes: Negative.   Respiratory: Positive for cough and shortness of breath.        Consistent cough that has been present in past associated with COPD.    Gastrointestinal: Negative.   Endocrine: Negative.   Genitourinary: Negative.   Musculoskeletal: Negative.   Skin: Negative.   Neurological: Positive for dizziness, light-headedness and headaches. Negative for tremors, seizures, syncope, facial asymmetry, speech difficulty, weakness and numbness.       See HPI  Hematological: Negative.   Psychiatric/Behavioral: Positive for sleep disturbance. Negative for suicidal ideas, hallucinations, behavioral problems, confusion, self-injury, dysphoric mood, decreased concentration and agitation. The patient is nervous/anxious. The patient is not hyperactive.        Objective:   Physical Exam  Constitutional: He is oriented to person, place, and time. He appears well-developed and well-nourished. No distress.  HENT:  Head: Normocephalic and atraumatic.  Eyes: EOM are normal. Pupils are equal, round, and reactive to light.  Neck: Normal range of motion.  Cardiovascular: Normal rate, regular rhythm and normal heart sounds.   Pulmonary/Chest: He has wheezes.  Coarse breath sounds.    Abdominal: Bowel sounds are normal. He exhibits no distension.  Musculoskeletal: Normal range of motion. He exhibits no edema.  Neurological: He is alert and oriented to person,  place, and time.  Skin: Skin is warm and dry. He is not diaphoretic.       Assessment:     Patient symptoms are likely related to his uncontrolled hypertension. Differential diagnosis include orthostatic hypotension, however orthostatic BP was obtained with unremarkable findings, BPV and depression could also  contribute to his current symptoms. Likely reason for uncontrolled hypertension and depression is due to non-compliance with medications. The patient appears to miss his medications including his lisinopril and sertraline 3-4X/week due to fever of taking too much.      Plan:     Hypertension: The importance of taking his medication daily was discussed with patient and his fear of overdosing was addressed. He understands that he doesn't have to take his medication at the exact same time each day. The patient was also given a log book to record his blood pressures and timing of his medications to be reviewed later. We will not change his current medication today due to lack of information to make such changes. Lipid panel was collected today and will be evaluated and addressed at his follow up appointment with possible initiation of statin therapy.   Depression: The patient will be converted to Fluoxetine 20mg  QD, and his Sertraline will be discontinued. The importance of taking his medication daily was stressed and that he may not see results for 2-4 weeks of regular administration.   Prophylaxis- Patient was administered influenza vaccination today.       Disposition: RTC 2 weeks for evaluation of BP log, depression and lipid panel results.

## 2012-09-16 ENCOUNTER — Telehealth: Payer: Self-pay | Admitting: Internal Medicine

## 2012-09-16 NOTE — Telephone Encounter (Signed)
   INTERNAL MEDICINE RESIDENCY PROGRAM After-Hours Telephone Call    Reason for call:   I placed an outgoing call to [name] at 230 pm. Patient and his wife called concerning about his blood pressure. His wife also states that patient has had chronic headache, and his headache is at baseline. Denies confusion, facial drooping, inability to talk, body numbness, tingling or weakness. Patient's BP are as follows per report from his wife.         09/13/12  BP 185/115   09/14/12  BP 166/80   09/15/12  BP 146/110   09/17/12  BP 165/115      Last encounter / Pertinent Data:   09/14/12. Office visit and seen by Dr. Dalphine Handing who indicated that his uncontrolled hypertension was due to medical noncompliance. Patient was instructed to take his home medication Lisinopril 10 mg po daily and follow up in 2 weeks. His BP was 166/85 during the office visit. He states that he takes lisinopril 5 mg po BID.     Assessment/ Plan:  # Uncontrolled hypertension      Patient has chronic uncontrolled hypertension due to medical noncompliance. He was instructed to take his home dose of Lisinopril after hsi OV on Friday on 09/14/12. Endorse baseline headache. No other signs of stroke.   Plan  I instructed patient to rest for 20-30 minutes and recheck his BP, which is 155/77 .   Patient is instructed to take his Lisinopril as prescribed and continue to check his resting blood pressure 1-2 times/ day. Patient and wife are educated about the signs and symptoms of stroke.   I will route this note to Dr. Dalphine Handing.   As always, pt is advised that if symptoms worsen or new symptoms arise, they should go to an urgent care facility or to to ER for further evaluation.    Dede Query, MD   09/16/2012, 2:44 PM

## 2012-09-17 MED ORDER — LISINOPRIL-HYDROCHLOROTHIAZIDE 10-12.5 MG PO TABS: 1.0000 | ORAL_TABLET | Freq: Every day | ORAL | Status: DC

## 2012-09-17 MED ORDER — HYDROCHLOROTHIAZIDE 12.5 MG PO TABS: 12.5000 mg | ORAL_TABLET | Freq: Every day | ORAL | Status: DC

## 2012-09-17 NOTE — Telephone Encounter (Addendum)
Talked to patient's wife - [name]. As mentioned in the previous visit account, the patient's medicines were not altered because he had hardly been taking the lisinopril that he was on which is 10 mg. Even today as I talk to his wife, she tells me that the patient has been taking Lisinopril 5 mg twice daily. Current measured bp relayed by wife 180/100. The wife also displays poor understanding of his medicines.   Keeping the non-compliance in mind, I will start him on Lisinopril - HCTZ combination 10-12.5 for now. I have instructed the wife about the change and went over it a couple of times with her and she repeated the instructions back to me. He will stop taking his current lisinopril and start taking the combination pill from tomorrow.   There is an allergy with celecoxib (cross sensitivity) listed (allergy outcome/reason - strange behaviour). I discussed with the wife what the allergy was and she had no idea. I will go ahead and prescribe HCTZ and let us know if he feels anything unusual. The patient will follow up with [place] on 9/5.

## 2012-09-17 NOTE — Addendum Note (Signed)
Addended by: Aletta Edouard on: 09/17/2012 03:42 PM   Modules accepted: Orders, Medications

## 2012-09-28 ENCOUNTER — Encounter: Payer: Self-pay | Admitting: Internal Medicine

## 2012-09-28 ENCOUNTER — Ambulatory Visit (INDEPENDENT_AMBULATORY_CARE_PROVIDER_SITE_OTHER): Payer: Medicare Other | Admitting: Internal Medicine

## 2012-09-28 VITALS — BP 140/79 | HR 86 | Temp 97.7°F | Ht 69.0 in | Wt 234.5 lb

## 2012-09-28 DIAGNOSIS — F329 Major depressive disorder, single episode, unspecified: Secondary | ICD-10-CM

## 2012-09-28 DIAGNOSIS — M25561 Pain in right knee: Secondary | ICD-10-CM

## 2012-09-28 DIAGNOSIS — I10 Essential (primary) hypertension: Secondary | ICD-10-CM

## 2012-09-28 DIAGNOSIS — F17200 Nicotine dependence, unspecified, uncomplicated: Secondary | ICD-10-CM

## 2012-09-28 DIAGNOSIS — Z0000 Encounter for general adult medical examination without abnormal findings: Secondary | ICD-10-CM

## 2012-09-28 DIAGNOSIS — Z720 Tobacco use: Secondary | ICD-10-CM

## 2012-09-28 DIAGNOSIS — J449 Chronic obstructive pulmonary disease, unspecified: Secondary | ICD-10-CM

## 2012-09-28 DIAGNOSIS — R05 Cough: Secondary | ICD-10-CM

## 2012-09-28 DIAGNOSIS — K219 Gastro-esophageal reflux disease without esophagitis: Secondary | ICD-10-CM

## 2012-09-28 MED ORDER — HYDROCODONE BITARTRATE ER 10 MG PO CP12: 10.0000 mg | ORAL_CAPSULE | Freq: Two times a day (BID) | ORAL | Status: DC

## 2012-09-28 MED ORDER — TRAMADOL HCL 50 MG PO TABS: 50.0000 mg | ORAL_TABLET | Freq: Three times a day (TID) | ORAL | Status: AC | PRN

## 2012-09-28 MED ORDER — OMEPRAZOLE 40 MG PO CPDR: DELAYED_RELEASE_CAPSULE | ORAL | Status: DC

## 2012-09-28 MED ORDER — HYDROCODONE-IBUPROFEN 5-200 MG PO TABS: 1.0000 | ORAL_TABLET | Freq: Two times a day (BID) | ORAL | Status: DC | PRN

## 2012-09-28 MED ORDER — BENZONATATE 100 MG PO CAPS: 100.0000 mg | ORAL_CAPSULE | Freq: Four times a day (QID) | ORAL | Status: DC | PRN

## 2012-09-28 NOTE — Progress Notes (Signed)
Patient ID: [name], male   DOB: 09/28/1945, 67 y.o.   MRN: [id number] Subjective:   Patient ID: [name] male   DOB: 08/19/1945 67 y.o.   MRN: [id number]  CC:  Follow up visit.  HPI:  Mr.[name] is a 67 y.o. man with past medical history as outlined below, who presents for a followup visit today  1. Hypertension: it was elevated in the previous visit due to partially non-complaince to the medications. Today his bp is 140/79 mmHg. He is taking his medications regularly.   2. Depression: He had worsening symptoms in his previous visit. He reported that sertraline does not work and reports that his wife agrees. He described feeling depressed about growing older and not being useful any longer. He also had guilty feeling. His sertraline was converted to Fluoxetine 20mg  QD. Today he feels much better. He is very happy about the new medications. He does not have any feeling of depression. He does not have suicidal ideation.   3. COPD: Still smokes 1PAD. Has still has cough. No chest pain. His shortness of breath is at his baseline.  ROS: Denies fever, chills, fatigue, headaches, abdominal pain,diarrhea, constipation, dysuria, urgency, frequency, hematuria.Has knee pain (R>L).     Past Medical History  Diagnosis Date  . Abnormal liver function tests 01/2010     on hospital admission February 23, 2010 AP = 36, AST = 62, ALT = 40, GGT = 392  . COPD (chronic obstructive pulmonary disease)   . Hypertension   . Hyperlipidemia   . GERD (gastroesophageal reflux disease)   . Anxiety   . Depression   . Pruritus 06/2009     this is most likely secondary to cholestasis, last bilirubin 2.7 (02-25-2010)  . Hepatitis C 2012     a hepatitis panel done February 19, 1998 showed anti-HBc +, anti-HBV +, hep C ab reactive, February 23, 2010 hepatitis B DNA not detected, HCV RNA  VL = 444000  . Shortness of breath     with exercise  . Cataract   . Glaucoma   . HOH (hard of hearing)    Current  Outpatient Prescriptions  Medication Sig Dispense Refill  . albuterol (VENTOLIN HFA) 108 (90 BASE) MCG/ACT inhaler Inhale 2 puffs into the lungs every 4 (four) hours as needed. For shortness of breath or wheezing.  1 Inhaler  5  . aspirin 81 MG chewable tablet Chew 81 mg by mouth daily.      . cholestyramine (QUESTRAN) 4 G packet Take 1 packet by mouth 3 (three) times daily with meals.  60 each  10  . cyclobenzaprine (FLEXERIL) 10 MG tablet Take 1 tablet (10 mg total) by mouth 2 (two) times daily as needed for muscle spasms.  20 tablet  0  . Fluoxetine HCl, PMDD, 20 MG CAPS Take 1 capsule (20 mg total) by mouth daily.  30 each  2  . Fluticasone-Salmeterol (ADVAIR DISKUS) 250-50 MCG/DOSE AEPB Inhale 1 puff into the lungs 2 (two) times daily.  60 each  5  . guaiFENesin (MUCINEX) 600 MG 12 hr tablet Take 1 tablet (600 mg total) by mouth 2 (two) times daily.  60 tablet  2  . hydrOXYzine (ATARAX/VISTARIL) 10 MG tablet Take 1 tablet (10 mg total) by mouth 3 (three) times daily as needed for itching.  90 tablet  2  . latanoprost (XALATAN) 0.005 % ophthalmic solution Place 1 drop into both eyes at bedtime.      Marland Kitchen lisinopril-hydrochlorothiazide (PRINZIDE) 10-12.5  MG per tablet Take 1 tablet by mouth daily.  30 tablet  1  . nicotine (NICODERM CQ - DOSED IN MG/24 HOURS) 21 mg/24hr patch Place 1 patch onto the skin daily.  30 patch  2  . omeprazole (PRILOSEC) 40 MG capsule TAKE ONE CAPSULE BY MOUTH DAILY  30 capsule  3  . tiotropium (SPIRIVA) 18 MCG inhalation capsule Place 1 capsule (18 mcg total) into inhaler and inhale daily.  30 capsule  5  . traMADol (ULTRAM) 50 MG tablet Take 1 tablet (50 mg total) by mouth every 8 (eight) hours as needed for pain.  40 tablet  0  . benzonatate (TESSALON PERLES) 100 MG capsule Take 1 capsule (100 mg total) by mouth every 6 (six) hours as needed for cough.  30 capsule  3  . HYDROcodone-acetaminophen (NORCO/VICODIN) 5-325 MG per tablet Take 1-2 tablets by mouth every 6 (six)  hours as needed for pain.  14 tablet  0   No current facility-administered medications for this visit.   Family History  Problem Relation Age of Onset  . Stroke Neg Hx   . Cancer Neg Hx    History   Social History  . Marital Status: Legally Separated    Spouse Name: N/A    Number of Children: N/A  . Years of Education: N/A   Social History Main Topics  . Smoking status: Current Every Day Smoker -- 1.00 packs/day for 49 years    Types: Cigarettes  . Smokeless tobacco: None     Comment: cuttin back from 1.5 pack/day  . Alcohol Use: No  . Drug Use: No  . Sexual Activity: None   Other Topics Concern  . None   Social History Narrative  . None    Review of Systems: Full 14-point review of systems otherwise negative. See HPI.   Objective:  Physical Exam: Filed Vitals:   09/28/12 0942  BP: 140/79  Pulse: 86  Temp: 97.7 F (36.5 C)  TempSrc: Oral  Height: 5\' 9"  (1.753 m)  Weight: 234 lb 8 oz (106.369 kg)  SpO2: 96%    Constitutional: Vital signs reviewed.  Patient is a well-developed and well-nourished, in no acute distress and cooperative with exam.  HEENT:  Head: Normocephalic and atraumatic Ear: TM normal bilaterally Mouth: no erythema or exudates, MMM Eyes: PERRL, EOMI, conjunctivae normal, No scleral icterus.  Neck: Supple, Trachea midline normal ROM, No JVD, mass, thyromegaly, or carotid bruit present. No lymph node enlargement. Cardiovascular: RRR, S1 normal, S2 normal, no MRG, pulses symmetric and intact bilaterally Pulmonary/Chest: Good air movement bilaterally, there is diffuse expiratory rhonchi bilaterally. There is no rales, wheezing or rubs. Abdominal: Soft. Non-tender, non-distended, bowel sounds are normal, no masses, organomegaly, or guarding present.   GU: no CVA tenderness Musculoskeletal: No joint deformities, erythema, or stiffness, ROM full and non-tender Hematology: no cervical, inginal, or axillary adenopathy.  Neurological: A&O x3,  Strength is normal and symmetric bilaterally, cranial nerve II-XII are grossly intact, no focal motor deficit, sensory intact to light touch bilaterally. Skin: Warm, dry and intact. No rash, cyanosis Psychiatric: Normal mood and affect. speech and behavior is normal. Judgment and thought content normal. Cognition and memory are normal.      Assessment & Plan:

## 2012-09-28 NOTE — Assessment & Plan Note (Signed)
-   gave pneumococcal vaccination today.

## 2012-09-28 NOTE — Assessment & Plan Note (Signed)
  Assessment: Progress toward smoking cessation:  smoking the same amount Barriers to progress toward smoking cessation:  lack of motivation to quit Comments:   Plan: Instruction/counseling given:  I counseled patient on the dangers of tobacco use, advised patient to stop smoking, and reviewed strategies to maximize success. Educational resources provided:    Self management tools provided:    Medications to assist with smoking cessation:  Nicotine Patch Patient agreed to the following self-care plans for smoking cessation:    Other plans:

## 2012-09-28 NOTE — Assessment & Plan Note (Signed)
It is stable. He has bilateral rhonchi. His shortness of breath is at his baseline. He still smokes one pack a day. No signs of acute exacerbation. He was educated again about the importance of quitting smoking. He would like to consider it. Will continue albuterol inhaler and Advair inhaler. Treat symptoms with a cough medication.

## 2012-09-28 NOTE — Assessment & Plan Note (Signed)
After switched to fluoxetine, patient's symptoms improved significantly. Today she feels much better. She does not feel depressed oday. She does not have suicidal ideations. She is happy about this new medication. Will continue current regimen.

## 2012-09-28 NOTE — Patient Instructions (Signed)
1.Please take all medications as prescribed.  2. Please cut down your smoking as we discussed. It is very important for your breathing.  3. If you have worsening of your symptoms or new symptoms arise, please call the clinic ([phone]), or go to the ER immediately if symptoms are severe.  You have done great job in taking all your medications. I appreciate it very much. Please continue doing that.

## 2012-09-28 NOTE — Progress Notes (Signed)
Rx for hydrocodone - ibuprofen called  In to pharmacy. Stanton Kidney Cedar Ditullio RN 09/28/12 2PM

## 2012-09-28 NOTE — Assessment & Plan Note (Signed)
BP Readings from Last 3 Encounters:  09/28/12 140/79  09/14/12 166/85  05/28/12 141/80    Lab Results  Component Value Date   NA 139 02/28/2012   K 4.5 02/28/2012   CREATININE 0.90 05/16/2012    Assessment: Blood pressure control: controlled Progress toward BP goal:    Comments:   Plan: Medications:  continue current medications Educational resources provided:   Self management tools provided:   Other plans: His blood pressure is normal. The blood pressure is 140/79. Patient is educated to continue his medications consistently.

## 2012-09-29 NOTE — Progress Notes (Signed)
Case discussed with Dr. Niu at the time of the visit.  We reviewed the resident's history and exam and pertinent patient test results.  I agree with the assessment, diagnosis, and plan of care documented in the resident's note.    

## 2012-10-19 IMAGING — US US ABDOMEN COMPLETE
1 series · 14 of 25 positions shown · non-contrast
Comparison: CT of 03/08/2007

CLINICAL DATA: Elevated liver function test.  Nausea.  Itching.

COMPLETE ABDOMINAL ULTRASOUND

[Series 1: us abdomen complete · 0.31mm/px · 14 of 84 slices shown]
[im 1/84]
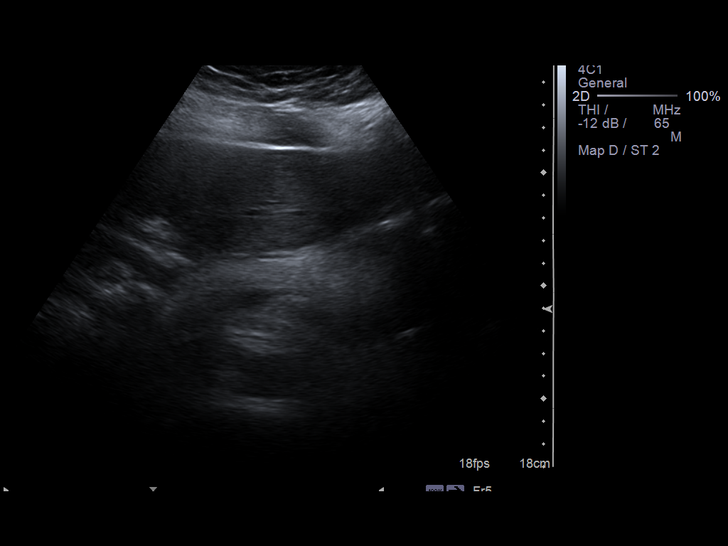
[im 7/84]
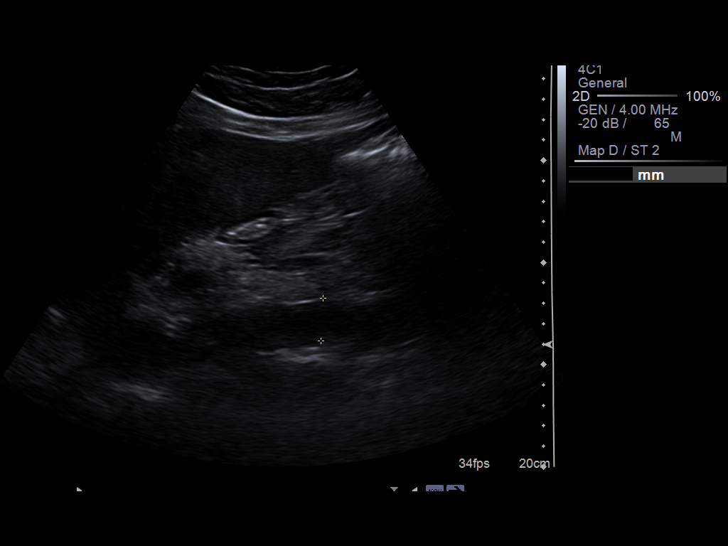
[im 14/84]
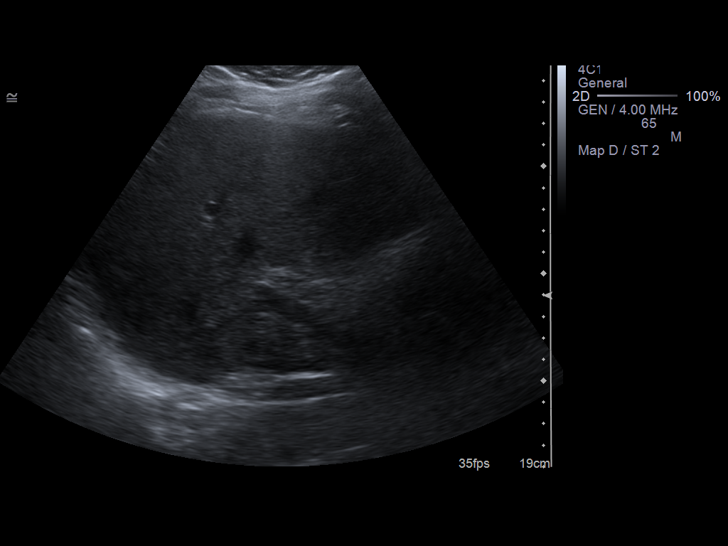
[im 21/84]
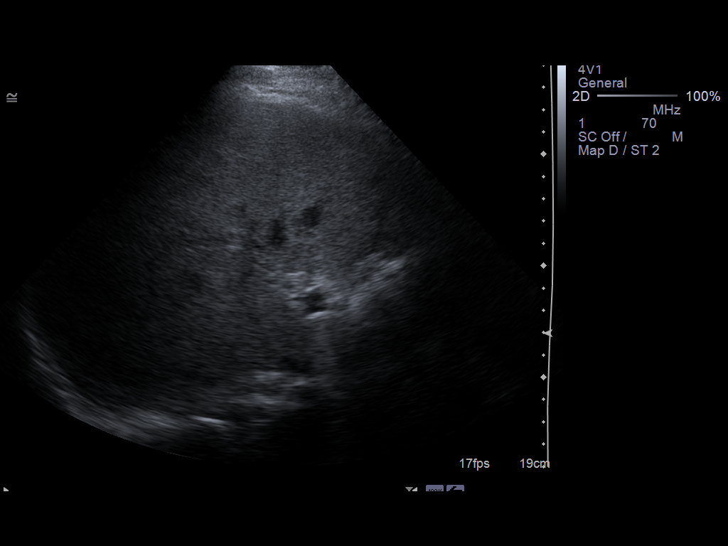
[im 28/84]
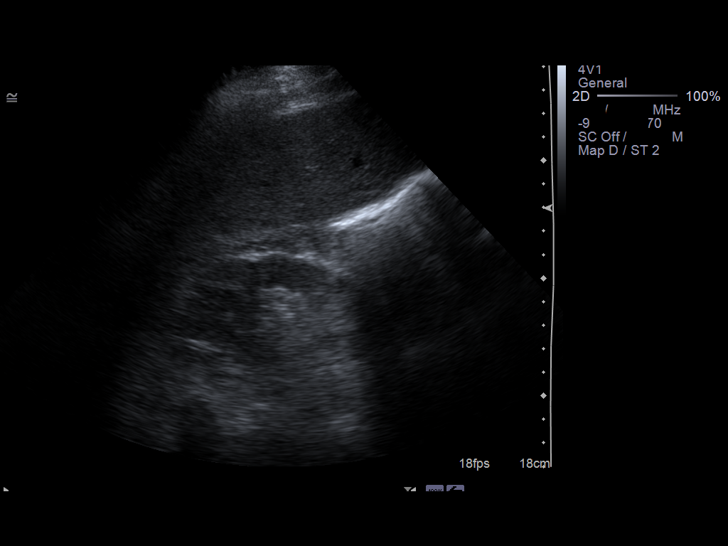
[im 32/84]
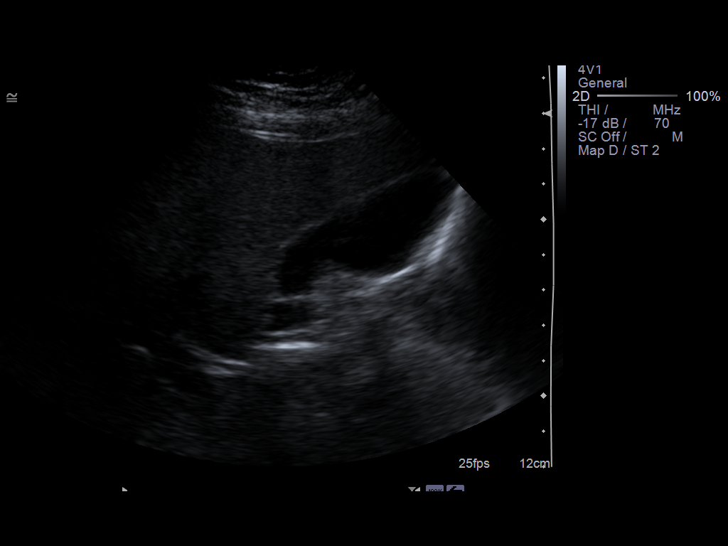
[im 39/84]
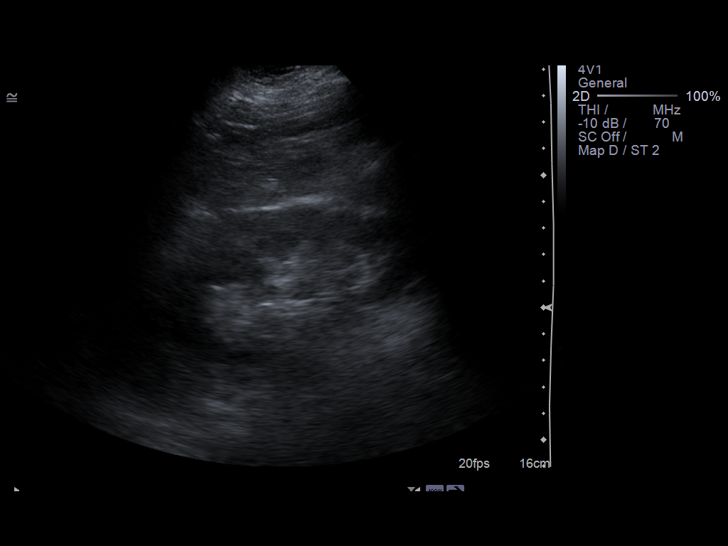
[im 45/84]
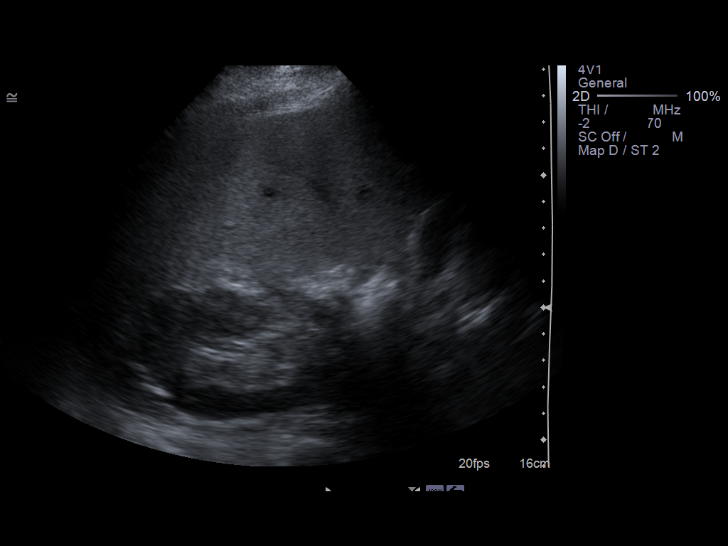
[im 52/84]
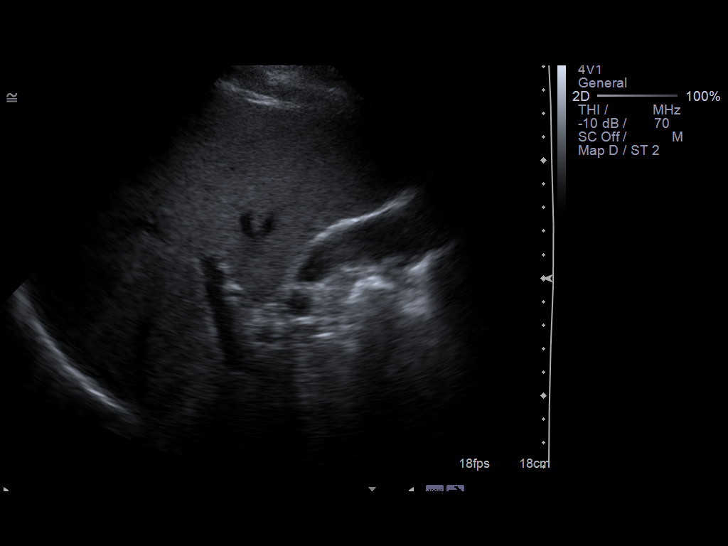
[im 56/84]
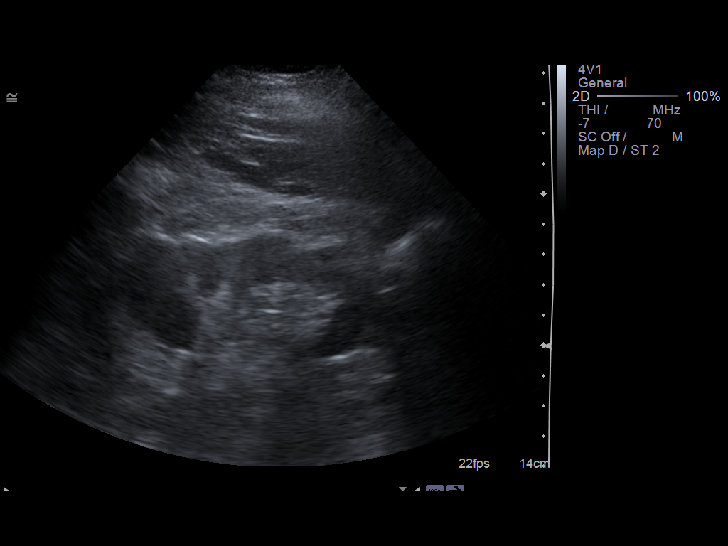
[im 63/84]
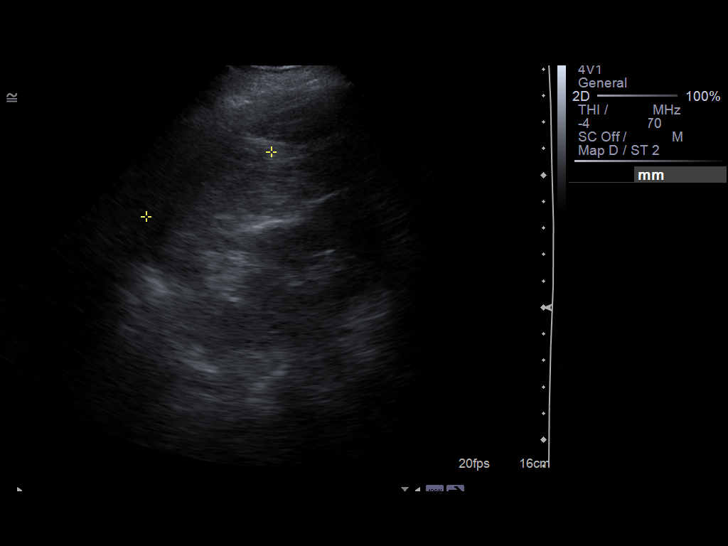
[im 70/84]
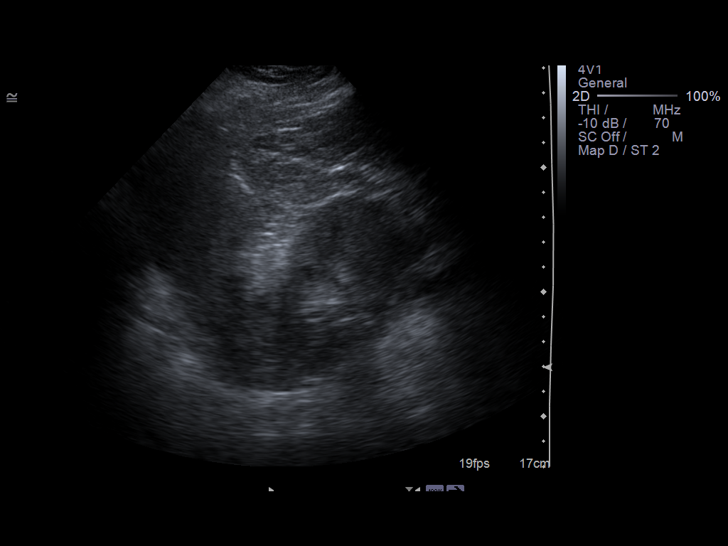
[im 77/84]
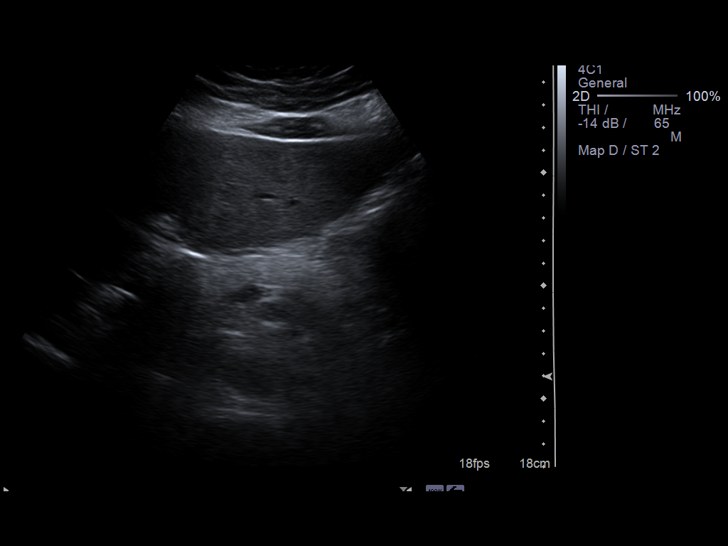
[im 84/84]
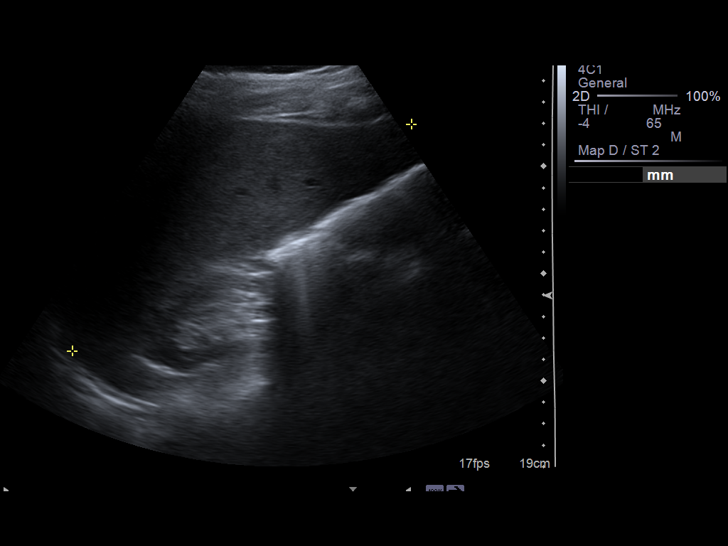

[14 of 25 positions shown; findings below may reference images not displayed]

FINDINGS: Gallbladder:  Normal, without wall thickening, stone, or
pericholecystic fluid.  Sonographic Murphy's sign was not elicited.

Common bile duct: Normal, at 4 mm.

Liver: Hepatomegaly, 19.0 cm.  Increased echogenicity, consistent
with steatosis.

IVC: Negative

Pancreas:  Negative

Spleen:  Normal in size and echogenicity.

Right Kidney:  11.8 cm. No hydronephrosis.

Left Kidney:  11.0 cm. No hydronephrosis.

Abdominal aorta:  Nonaneurysmal without ascites.
IMPRESSION: 1.  Hepatomegaly and hepatic steatosis.
2.  No other explanation for elevated liver function tests.

## 2012-10-19 IMAGING — CR DG CHEST 2V
2 series · 2 of 2 positions shown · non-contrast
Comparison: 03/28/2008.

CLINICAL DATA: Acute hepatitis.  Pneumonia.  Short of breath.

CHEST - 2 VIEW

[w chest pa]
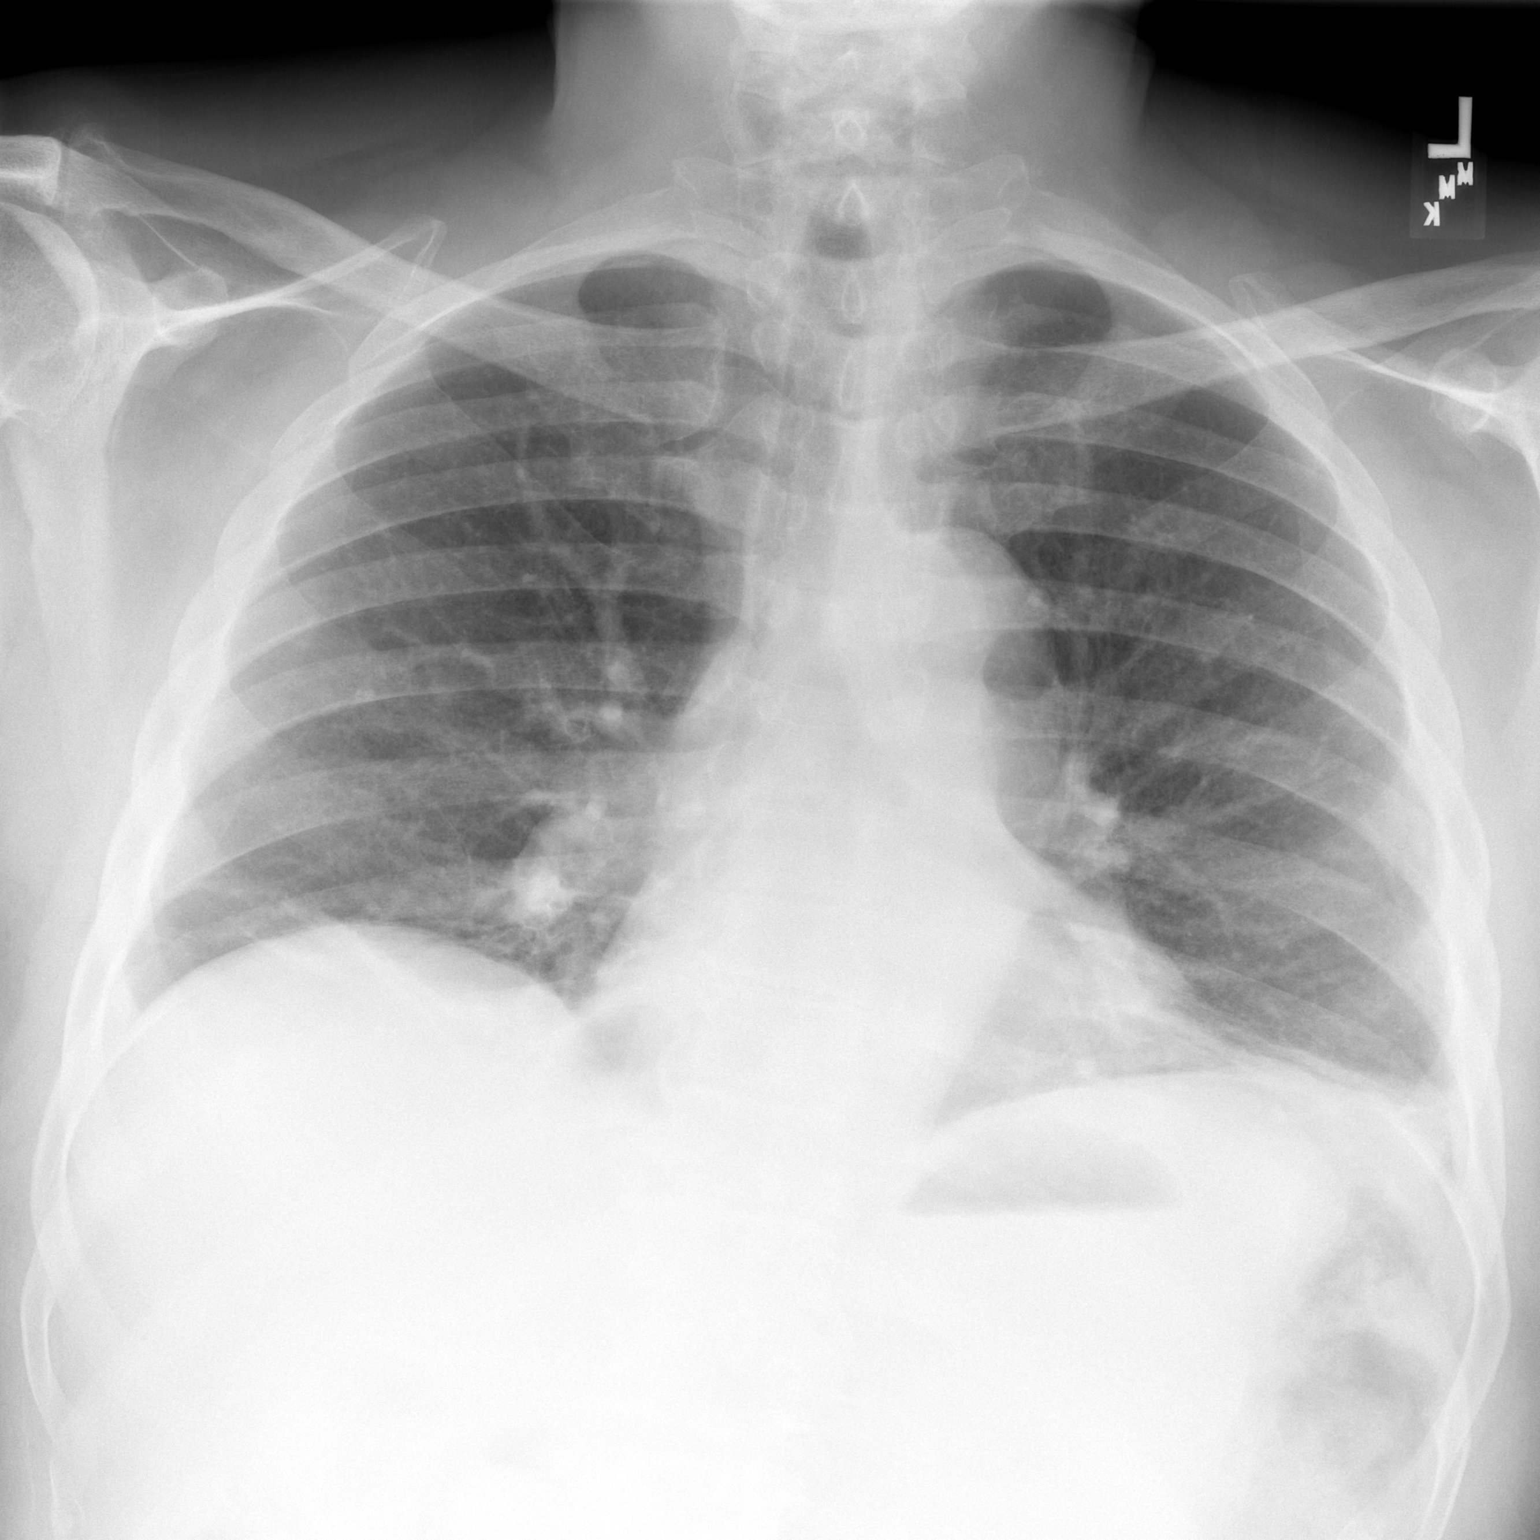

[w chest lat]
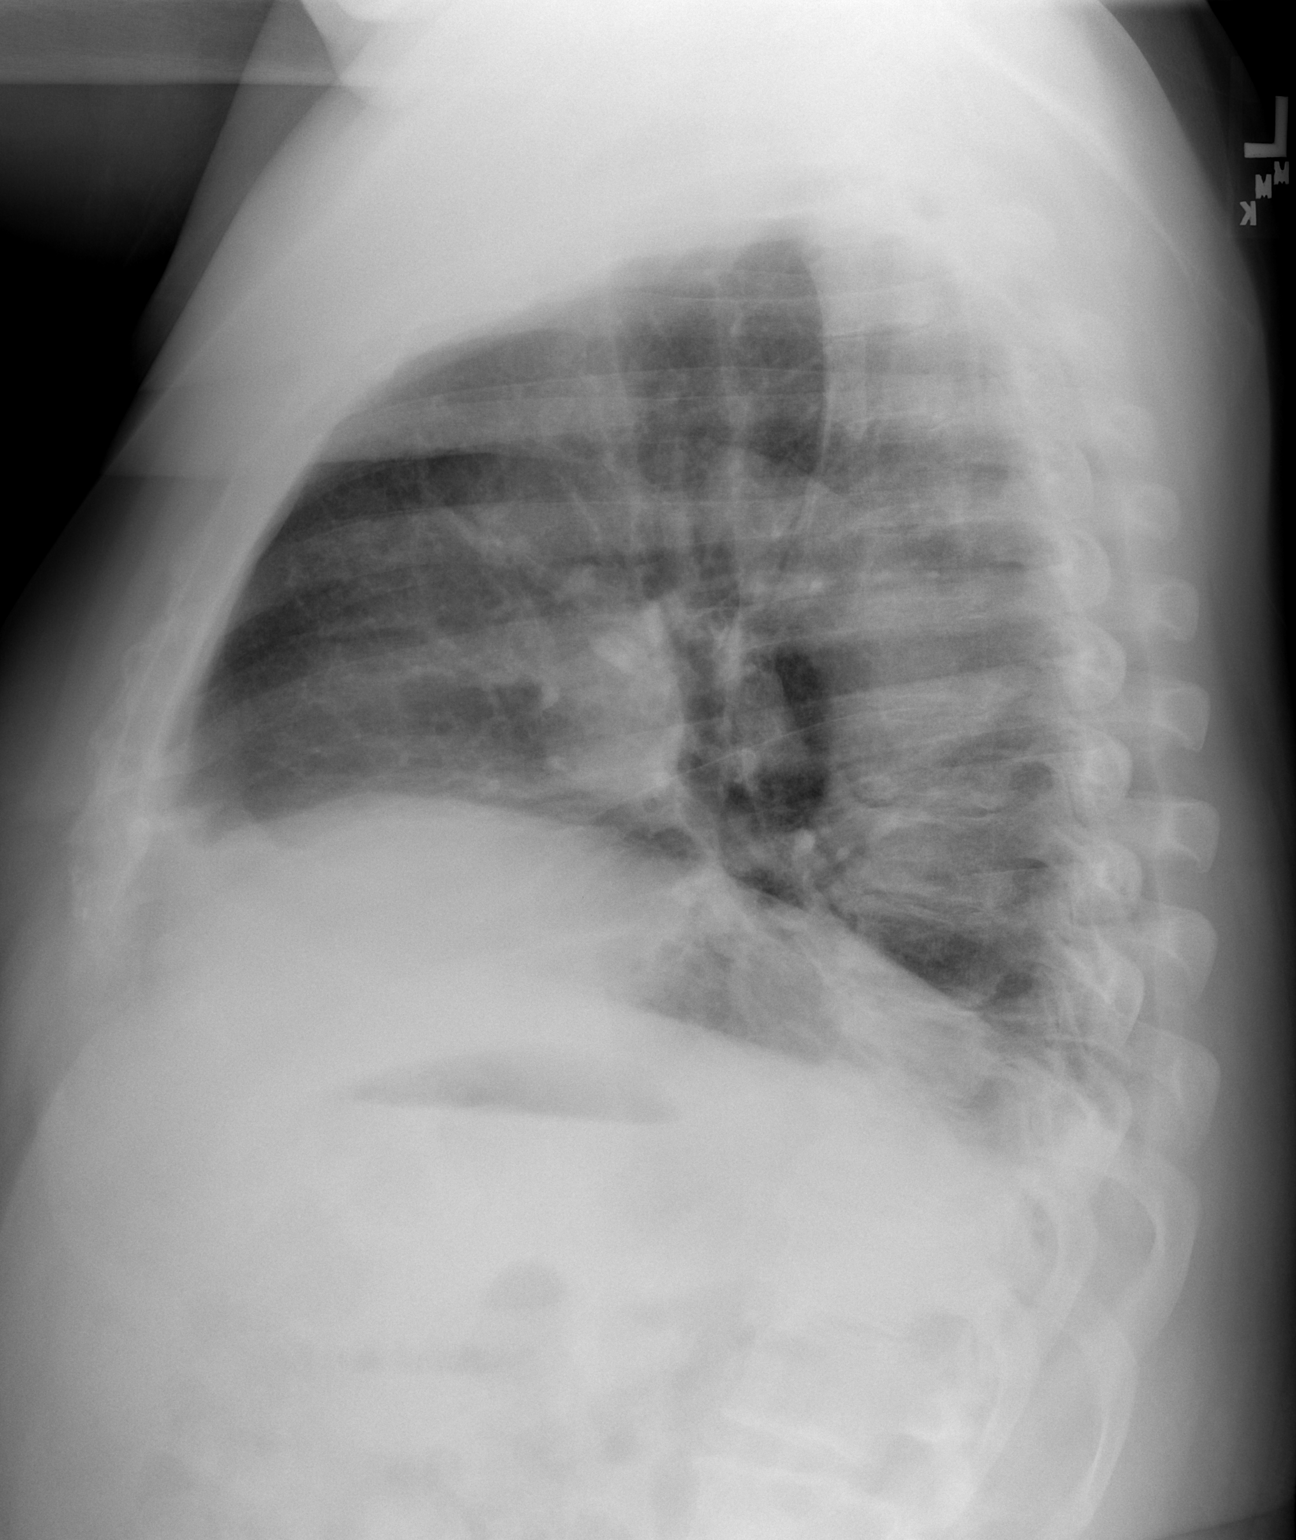

[2 of 2 positions shown; findings below may reference images not displayed]

FINDINGS: Cardiopericardial silhouette within normal limits.
Prominent bilateral basilar atelectasis.  Lung volumes are low.
Crowding of pulmonary vasculature is present.  No airspace
disease/pneumonia.  No pleural effusion identified.
IMPRESSION: Low volume chest with bilateral basilar atelectasis.  No focal
airspace disease/pneumonia identified.

## 2012-10-26 ENCOUNTER — Other Ambulatory Visit: Payer: Self-pay | Admitting: Internal Medicine

## 2012-10-26 DIAGNOSIS — F329 Major depressive disorder, single episode, unspecified: Secondary | ICD-10-CM

## 2012-11-01 ENCOUNTER — Other Ambulatory Visit: Payer: Self-pay | Admitting: Internal Medicine

## 2012-11-01 DIAGNOSIS — I10 Essential (primary) hypertension: Secondary | ICD-10-CM

## 2012-11-08 ENCOUNTER — Other Ambulatory Visit: Payer: Self-pay | Admitting: Internal Medicine

## 2012-11-08 DIAGNOSIS — I10 Essential (primary) hypertension: Secondary | ICD-10-CM

## 2012-11-14 ENCOUNTER — Other Ambulatory Visit: Payer: Self-pay | Admitting: Internal Medicine

## 2012-11-14 DIAGNOSIS — F329 Major depressive disorder, single episode, unspecified: Secondary | ICD-10-CM

## 2012-12-11 ENCOUNTER — Other Ambulatory Visit: Payer: Self-pay | Admitting: Internal Medicine

## 2012-12-11 DIAGNOSIS — F329 Major depressive disorder, single episode, unspecified: Secondary | ICD-10-CM

## 2012-12-18 ENCOUNTER — Other Ambulatory Visit: Payer: Self-pay | Admitting: Internal Medicine

## 2012-12-18 DIAGNOSIS — F329 Major depressive disorder, single episode, unspecified: Secondary | ICD-10-CM

## 2013-01-26 ENCOUNTER — Other Ambulatory Visit: Payer: Self-pay | Admitting: Internal Medicine

## 2013-01-28 ENCOUNTER — Other Ambulatory Visit: Payer: Self-pay | Admitting: Internal Medicine

## 2013-01-28 DIAGNOSIS — I10 Essential (primary) hypertension: Secondary | ICD-10-CM

## 2013-02-04 ENCOUNTER — Ambulatory Visit: Payer: Medicare Other | Admitting: Internal Medicine

## 2013-02-13 ENCOUNTER — Other Ambulatory Visit: Payer: Self-pay | Admitting: Internal Medicine

## 2013-02-13 DIAGNOSIS — C220 Liver cell carcinoma: Secondary | ICD-10-CM

## 2013-02-16 ENCOUNTER — Other Ambulatory Visit: Payer: Self-pay | Admitting: Internal Medicine

## 2013-02-16 DIAGNOSIS — K219 Gastro-esophageal reflux disease without esophagitis: Secondary | ICD-10-CM

## 2013-02-18 ENCOUNTER — Ambulatory Visit
Admission: RE | Admit: 2013-02-18 | Discharge: 2013-02-18 | Disposition: A | Discharge status: Home / Self Care | Payer: Medicare HMO | Source: Ambulatory Visit | Attending: Internal Medicine | Admitting: Internal Medicine

## 2013-02-18 DIAGNOSIS — C220 Liver cell carcinoma: Secondary | ICD-10-CM

## 2013-03-05 ENCOUNTER — Other Ambulatory Visit: Payer: Self-pay | Admitting: Internal Medicine

## 2013-03-05 DIAGNOSIS — J449 Chronic obstructive pulmonary disease, unspecified: Secondary | ICD-10-CM

## 2013-04-04 ENCOUNTER — Encounter: Payer: Self-pay | Admitting: Internal Medicine

## 2013-04-04 ENCOUNTER — Ambulatory Visit (INDEPENDENT_AMBULATORY_CARE_PROVIDER_SITE_OTHER): Payer: Commercial Managed Care - HMO | Admitting: Internal Medicine

## 2013-04-04 VITALS — BP 128/68 | HR 81 | Temp 97.0°F | Ht 68.5 in | Wt 251.0 lb

## 2013-04-04 DIAGNOSIS — F3289 Other specified depressive episodes: Secondary | ICD-10-CM

## 2013-04-04 DIAGNOSIS — F32A Depression, unspecified: Secondary | ICD-10-CM

## 2013-04-04 DIAGNOSIS — I10 Essential (primary) hypertension: Secondary | ICD-10-CM

## 2013-04-04 DIAGNOSIS — F329 Major depressive disorder, single episode, unspecified: Secondary | ICD-10-CM

## 2013-04-04 DIAGNOSIS — R05 Cough: Secondary | ICD-10-CM

## 2013-04-04 DIAGNOSIS — R058 Other specified cough: Secondary | ICD-10-CM | POA: Insufficient documentation

## 2013-04-04 DIAGNOSIS — R059 Cough, unspecified: Secondary | ICD-10-CM

## 2013-04-04 MED ORDER — FLUOXETINE HCL 40 MG PO CAPS: ORAL_CAPSULE | ORAL | Status: DC

## 2013-04-04 MED ORDER — CHLORPHENIRAMINE MALEATE 4 MG PO TABS: 4.0000 mg | ORAL_TABLET | Freq: Two times a day (BID) | ORAL | Status: AC | PRN

## 2013-04-04 NOTE — Progress Notes (Signed)
   Subjective:    Patient ID: [name], male    DOB: [birth year], 68 y.o.   MRN: [id number]  HPI  Pt presents for f/u of depression since the death of his brother.  Had been started on fluoxetine 20 mg which he states isnt effective.  Also complaining of mild cough at times that has been ongoing "for 6 or 7 months". The cough is associated with runny nose at times and pt thinks it may be dust in the home contributing. No shortness of breath, chest pain, or fever.    Review of Systems  Constitutional: Negative for fever and fatigue.  HENT: Positive for postnasal drip and rhinorrhea. Negative for congestion.   Eyes: Negative for visual disturbance.  Respiratory: Negative for shortness of breath and wheezing.   Cardiovascular: Negative for chest pain and palpitations.  Gastrointestinal: Negative.   Genitourinary: Negative.   Musculoskeletal:       Right shoulder "been bothering me".  Allergic/Immunologic:       Thinks he is allergic to dust  Neurological: Negative.   Psychiatric/Behavioral: Positive for dysphoric mood.       Objective:   Physical Exam  Constitutional: He is oriented to person, place, and time. He appears well-developed and well-nourished. No distress.  HENT:  Head: Normocephalic and atraumatic.  Eyes: Conjunctivae and EOM are normal. Pupils are equal, round, and reactive to light.  Neck: Normal range of motion.  Cardiovascular: Normal rate, regular rhythm, normal heart sounds and intact distal pulses.   Pulmonary/Chest: Effort normal and breath sounds normal. No respiratory distress. He has no wheezes.  Abdominal: Soft. Bowel sounds are normal.  Musculoskeletal: He exhibits no edema.  Declines shoulder exam, sates that he will "just take Aleve"  Neurological: He is alert and oriented to person, place, and time.  Skin: Skin is warm and dry.  Psychiatric: He has a normal mood and affect. He expresses no suicidal ideation.  States that he is depressed but his  disposition on exam is normal          Assessment & Plan:  See separate problem list charting:

## 2013-04-04 NOTE — Assessment & Plan Note (Signed)
Will increase fluoxetine to 40 mg qd.  Pt to take 2 of the 20 mg pills daily. Will change refill to 40 mg pills for 1 pill daily.

## 2013-04-04 NOTE — Patient Instructions (Addendum)
General Instructions: Take the Aleve for your shoulder pain. It is important to take the heartburn medicine with the Aleve. Your cough may be from your post-nasal drip. We will given you an inexpensive medicine to help with this. Try to cut down your smoking.  This puts you at risk for lung disease and lung cancer. Start taking 2 pills of the depression medicine (fluoxetine).  This will be 40 mg.  When it is time for a refill, the pills will be changed to 40 mg and then you will only need to take 1 pill a day. Return to clinic in 3 months or earlier if needed.  Thank you for bringing your medicines today. This helps [place] keep you safe from mistakes!  Treatment Goals:  Goals (1 Years of Data) as of 04/04/13         As of Today 09/28/12 09/14/12 05/28/12 02/28/12     Blood Pressure    . Blood Pressure < 140/90  128/68 140/79 166/85 141/80 133/86      Progress Toward Treatment Goals:  Treatment Goal 04/04/2013  Blood pressure at goal  Stop smoking smoking the same amount    Self Care Goals & Plans:  Self Care Goal 04/04/2013  Manage my medications take my medicines as prescribed; bring my medications to every visit; refill my medications on time; follow the sick day instructions if I am sick  Monitor my health keep track of my blood pressure  Eat healthy foods eat more vegetables; eat fruit for snacks and desserts  Be physically active find an activity I enjoy    No flowsheet data found.   Care Management & Community Referrals:  Referral 09/14/2012  Referrals made for care management support none needed  Referrals made to community resources -     Chlorpheniramine tablets What is this medicine? CHLORPHENIRAMINE (klor fen IR a meen) is an antihistamine. It is used to treat a runny nose from allergies or a cold. It is also used to treat the symptoms an allergic reaction. This medicine will not treat an infection. This medicine may be used for other purposes; ask your health care  provider or pharmacist if you have questions. COMMON BRAND NAME(S): AHIST, Aller-Chlor , Allergy , Chlor-Pheniton, Chlor-Trimeton, Diabetic Tussin Allergy Relief, ED-Chlortan, Teldrin HBP What should I tell my health care provider before I take this medicine? They need to know if you have any of these conditions: -glaucoma -heart disease -high blood pressure -lung or breathing disease, like asthma -pain or difficulty passing urine -prostate trouble -ulcers or other stomach problems -an unusual or allergic reaction to chlorpheniramine, other medicines, foods, dyes, or preservatives -pregnant or trying to get pregnant -breast-feeding How should I use this medicine? Take this medicine by mouth with a full glass of water. Follow the directions on the prescription label. Take your doses at regular intervals. Do not take your medicine more often than directed. Talk to your pediatrician regarding the use of this medicine in children. While this drug may be prescribed for selected conditions, precautions do apply. Patients over 51 years old may have a stronger reaction and need a smaller dose. Overdosage: If you think you have taken too much of this medicine contact a poison control center or emergency room at once. NOTE: This medicine is only for you. Do not share this medicine with others. What if I miss a dose? If you miss a dose, take it as soon as you can. If it is almost time for your next  dose, take only that dose. Do not take double or extra doses. What may interact with this medicine? -alcohol -barbiturate medicines for sleep or treating seizures -MAOIs like Carbex, Eldepryl, Marplan, Nardil, and Parnate -medicines for allergies -medicines for depression, anxiety, or psychotic disturbances -medicines for sleep -some antibiotics This list may not describe all possible interactions. Give your health care provider a list of all the medicines, herbs, non-prescription drugs, or dietary  supplements you use. Also tell them if you smoke, drink alcohol, or use illegal drugs. Some items may interact with your medicine. What should I watch for while using this medicine? Visit your doctor or health care professional for regular check ups. Tell your doctor if your symptoms do not improve or if they get worse. Your mouth may get dry. Chewing sugarless gum or sucking hard candy, and drinking plenty of water may help. Contact your doctor if the problem does not go away or is severe. This medicine may cause dry eyes and blurred vision. If you wear contact lenses you may feel some discomfort. Lubricating drops may help. See your eye doctor if the problem does not go away or is severe. You may get drowsy or dizzy. Do not drive, use machinery, or do anything that needs mental alertness until you know how this medicine affects you. Do not stand or sit up quickly, especially if you are an older patient. This reduces the risk of dizzy or fainting spells. Alcohol may interfere with the effect of this medicine. Avoid alcoholic drinks. This medicine can make you more sensitive to the sun. Keep out of the sun. If you cannot avoid being in the sun, wear protective clothing and use sunscreen. Do not use sun lamps or tanning beds/booths. What side effects may I notice from receiving this medicine? Side effects that you should report to your doctor or health care professional as soon as possible: -allergic reactions like skin rash, itching or hives, swelling of the face, lips, or tongue -breathing problems -changes in vision -confused, agitated, nervous -fast, irregular heartbeat -feeling faint, dizzy -seizures -tremor -trouble passing urine or change in the amount of urine -unusual sweating -unusually weak or tired Side effects that usually do not require medical attention (report to your doctor or health care professional if they continue or are bothersome): -constipation or diarrhea -drowsy -dry  mouth, nose, throat -headache -loss of appetite -stomach upset, vomiting -trouble sleeping This list may not describe all possible side effects. Call your doctor for medical advice about side effects. You may report side effects to FDA at 1-800-FDA-1088. Where should I keep my medicine? Keep out of the reach of children. Store at room temperature between 15 and 30 degrees C (59 and 86 degrees F). Keep container tightly closed. Throw away any unused medicine after the expiration date. NOTE: This sheet is a summary. It may not cover all possible information. If you have questions about this medicine, talk to your doctor, pharmacist, or health care provider.  2014, [employer]. (2007-04-25 17:37:35)

## 2013-04-04 NOTE — Assessment & Plan Note (Signed)
BP Readings from Last 3 Encounters:  04/04/13 128/68  09/28/12 140/79  09/14/12 166/85    Lab Results  Component Value Date   NA 139 02/28/2012   K 4.5 02/28/2012   CREATININE 0.90 05/16/2012    Assessment: Blood pressure control: controlled Progress toward BP goal:  at goal Comments:   Plan: Medications:  continue current medications Educational resources provided: brochure;handout;video Self management tools provided:   Other plans: cont lisinopril-hct 10-12.5, if cough continues on chlorpheniramine may need to consider d/c lisinopril

## 2013-04-05 NOTE — Progress Notes (Signed)
Case discussed with Dr. Schooler at the time of the visit.  We reviewed the resident's history and exam and pertinent patient test results.  I agree with the assessment, diagnosis, and plan of care documented in the resident's note.     

## 2013-04-05 NOTE — Assessment & Plan Note (Addendum)
Likely secondary to post-nasal drip related to perrenial allergies.  Will give trial of chlorpheniramine. If improved consider long-tem therapy with anti-histamine.

## 2013-05-15 ENCOUNTER — Other Ambulatory Visit: Payer: Self-pay | Admitting: Internal Medicine

## 2013-05-15 DIAGNOSIS — I10 Essential (primary) hypertension: Secondary | ICD-10-CM

## 2013-05-27 ENCOUNTER — Other Ambulatory Visit: Payer: Self-pay | Admitting: Internal Medicine

## 2013-05-27 DIAGNOSIS — B1920 Unspecified viral hepatitis C without hepatic coma: Secondary | ICD-10-CM

## 2013-06-10 ENCOUNTER — Encounter: Payer: Self-pay | Admitting: Internal Medicine

## 2013-06-10 ENCOUNTER — Ambulatory Visit (HOSPITAL_COMMUNITY)
Admission: RE | Admit: 2013-06-10 | Discharge: 2013-06-10 | Disposition: A | Discharge status: Home / Self Care | Payer: Medicare HMO | Source: Ambulatory Visit | Attending: Internal Medicine | Admitting: Internal Medicine

## 2013-06-10 ENCOUNTER — Ambulatory Visit (INDEPENDENT_AMBULATORY_CARE_PROVIDER_SITE_OTHER): Payer: Commercial Managed Care - HMO | Admitting: Internal Medicine

## 2013-06-10 VITALS — BP 140/82 | HR 71 | Temp 97.1°F | Ht 68.5 in | Wt 258.0 lb

## 2013-06-10 DIAGNOSIS — R059 Cough, unspecified: Secondary | ICD-10-CM | POA: Insufficient documentation

## 2013-06-10 DIAGNOSIS — J984 Other disorders of lung: Secondary | ICD-10-CM | POA: Insufficient documentation

## 2013-06-10 DIAGNOSIS — Z720 Tobacco use: Secondary | ICD-10-CM

## 2013-06-10 DIAGNOSIS — J449 Chronic obstructive pulmonary disease, unspecified: Secondary | ICD-10-CM

## 2013-06-10 DIAGNOSIS — J9819 Other pulmonary collapse: Secondary | ICD-10-CM | POA: Insufficient documentation

## 2013-06-10 DIAGNOSIS — R05 Cough: Secondary | ICD-10-CM | POA: Insufficient documentation

## 2013-06-10 DIAGNOSIS — R062 Wheezing: Secondary | ICD-10-CM | POA: Insufficient documentation

## 2013-06-10 DIAGNOSIS — F17200 Nicotine dependence, unspecified, uncomplicated: Secondary | ICD-10-CM

## 2013-06-10 DIAGNOSIS — R0602 Shortness of breath: Secondary | ICD-10-CM | POA: Insufficient documentation

## 2013-06-10 MED ORDER — DM-GUAIFENESIN ER 30-600 MG PO TB12: 2.0000 | ORAL_TABLET | Freq: Every day | ORAL | Status: DC

## 2013-06-10 NOTE — Progress Notes (Signed)
INTERNAL MEDICINE TEACHING ATTENDING ADDENDUM - Aldine Contes, MD: I reviewed and discussed at the time of visit with the resident Dr. Denton Brick, the patient's medical history, physical examination, diagnosis and results of tests and treatment and I agree with the patient's care as documented.

## 2013-06-10 NOTE — Progress Notes (Signed)
Patient ID: [name], male   DOB: July 13, 1945, 68 y.o.   MRN: [id number]   Subjective:   Patient ID: [name] male   DOB: [birth year] 68 y.o.   MRN: [id number]  HPI: Mr.[name] is a 68 y.o. with PMH of COPD, HTN, Depression, and Hep C, presented today with c/o of Cough, present for the past year, says sometimes he coughs so much he passes out. Cough is productive of whitish sputum, no recent change in quantity or colour. Pt has baseline SOB present with exertion but thi is unchanged and has been present for years. No Nasal congestion or itchy eyes, no body aches, no fever, no sick contacts, no pedal edema, uses 1 pillow to sleep for years.  Pt presented for similar complaints previously was prescribed tessalon, but say his insurance would not cover it, and he could not afford it. Pt smokes 1ppd of cigarattes, has smoked since he was 68 years old. No hx of weightloss, has actually added weight, pt has been counselled several times about smoking cessation but he says she is not ready and just wants [place] to do something about the cough he is having. Pt is on spiriva and Advir inhalers and says he has not been very compliant with them.     Past Medical History  Diagnosis Date  . Abnormal liver function tests 01/2010     on hospital admission February 23, 2010 AP = 36, AST = 62, ALT = 40, GGT = 392  . COPD (chronic obstructive pulmonary disease)   . Hypertension   . Hyperlipidemia   . GERD (gastroesophageal reflux disease)   . Anxiety   . Depression   . Pruritus 06/2009     this is most likely secondary to cholestasis, last bilirubin 2.7 (02-25-2010)  . Hepatitis C 2012     a hepatitis panel done February 19, 1998 showed anti-HBc +, anti-HBV +, hep C ab reactive, February 23, 2010 hepatitis B DNA not detected, HCV RNA  VL = 444000  . Shortness of breath     with exercise  . Cataract   . Glaucoma   . HOH (hard of hearing)    Current Outpatient Prescriptions  Medication Sig Dispense  Refill  . albuterol (VENTOLIN HFA) 108 (90 BASE) MCG/ACT inhaler Inhale 2 puffs into the lungs every 4 (four) hours as needed. For shortness of breath or wheezing.  1 Inhaler  5  . aspirin 81 MG chewable tablet Chew 81 mg by mouth daily.      . benzonatate (TESSALON PERLES) 100 MG capsule Take 1 capsule (100 mg total) by mouth every 6 (six) hours as needed for cough.  30 capsule  3  . chlorpheniramine (CHLOR-TRIMETON) 4 MG tablet Take 1 tablet (4 mg total) by mouth 2 (two) times daily as needed for allergies.  14 tablet  0  . cholestyramine (QUESTRAN) 4 G packet Take 1 packet by mouth 3 (three) times daily with meals.  60 each  10  . cholestyramine (QUESTRAN) 4 G packet MIX THE CONTENTS OF 1 PACKET WITH 4-6 OUNCES OF LIQUID AND DRINK TWICE DAILY WITH A MEAL  60 packet  5  . cyclobenzaprine (FLEXERIL) 10 MG tablet Take 1 tablet (10 mg total) by mouth 2 (two) times daily as needed for muscle spasms.  20 tablet  0  . dextromethorphan-guaiFENesin (MUCINEX DM) 30-600 MG per 12 hr tablet Take 2 tablets by mouth daily.  30 tablet  0  . FLUoxetine (PROZAC) 40 MG capsule  TAKE ONE CAPSULE BY MOUTH DAILY  30 capsule  5  . Fluticasone-Salmeterol (ADVAIR DISKUS) 250-50 MCG/DOSE AEPB Inhale 1 puff into the lungs 2 (two) times daily.  60 each  5  . hydrocodone-ibuprofen (VICOPROFEN) 5-200 MG per tablet Take 1 tablet by mouth every 12 (twelve) hours as needed for pain.  40 tablet  0  . hydrOXYzine (ATARAX/VISTARIL) 10 MG tablet Take 1 tablet (10 mg total) by mouth 3 (three) times daily as needed for itching.  90 tablet  2  . latanoprost (XALATAN) 0.005 % ophthalmic solution Place 1 drop into both eyes at bedtime.      Marland Kitchen lisinopril-hydrochlorothiazide (PRINZIDE,ZESTORETIC) 10-12.5 MG per tablet TAKE 1 TABLET BY MOUTH DAILY  30 tablet  11  . lisinopril-hydrochlorothiazide (PRINZIDE,ZESTORETIC) 10-12.5 MG per tablet TAKE 1 TABLET BY MOUTH EVERY DAY  30 tablet  3  . nicotine (NICODERM CQ - DOSED IN MG/24 HOURS) 21  mg/24hr patch Place 1 patch onto the skin daily.  30 patch  2  . omeprazole (PRILOSEC) 40 MG capsule TAKE 1 CAPSULE BY MOUTH DAILY  30 capsule  3  . SPIRIVA HANDIHALER 18 MCG inhalation capsule INHALE CONTENTS OF ONE CAPSULE ONCE DAILY USING HANDIHALER  30 capsule  5  . traMADol (ULTRAM) 50 MG tablet Take 1 tablet (50 mg total) by mouth every 8 (eight) hours as needed for pain.  40 tablet  0   No current facility-administered medications for this visit.   Family History  Problem Relation Age of Onset  . Stroke Neg Hx   . Cancer Neg Hx    History   Social History  . Marital Status: Married    Spouse Name: N/A    Number of Children: N/A  . Years of Education: N/A   Social History Main Topics  . Smoking status: Current Every Day Smoker -- 1.00 packs/day for 49 years    Types: Cigarettes  . Smokeless tobacco: None     Comment: cuttin back from 1.5 pack/day  . Alcohol Use: No  . Drug Use: No  . Sexual Activity: None   Other Topics Concern  . None   Social History Narrative  . None   Review of Systems: CONSTITUTIONAL- No Fever, weightloss, night sweat or change in appetite. SKIN- No Rash, colour changes or itching. HEAD- No Headache or dizziness. EYES- No Vision loss, pain, redness, double or blurred vision. EARS- No vertigo, hearing loss or ear discharge. Mouth/throat- No Sorethroat, dentures, or bleeding gums. RESPIRATORY- No Cough or SOB. CARDIAC- No Palpitations, DOE, PND or chest pain. GI- No nausea, vomiting, diarrhoea, constipation, abd pain. URINARY- No Frequency, urgency, straining or dysuria. NEUROLOGIC- No Numbness, syncope, seizures or burning. [facility]- Describes mood as happy, good sleep and appetite.  Objective:  Physical Exam: Filed Vitals:   06/10/13 1001  BP: 140/82  Pulse: 71  Temp: 97.1 F (36.2 C)  TempSrc: Oral  Height: 5' 8.5" (1.74 m)  Weight: 258 lb (117.028 kg)  SpO2: 100%   GENERAL- alert, co-operative, appears as stated age, not in any  distress. HEENT- Atraumatic, normocephalic, PERRL, EOMI, oral mucosa appears moist, no cervical LN enlargement, thyroid does not appear enlarged, neck supple. CARDIAC- RRR, no murmurs, rubs or gallops. RESP- Mild expiratory wheezes. ABDOMEN- Soft, nontender, no guarding or rebound, no palpable masses or organomegaly, bowel sounds present. BACK- Normal curvature of the spine, No tenderness along the vertebrae, no CVA tenderness. NEURO- No obvious Cr N abnormality, strenght upper and lower extremities- 5/5, Gait- Normal. EXTREMITIES-  pulse 2+, symmetric, no pedal edema. SKIN- Warm, dry, No rash or lesion. PSYCH- Normal mood and affect, appropriate thought content and speech.  Assessment & Plan:  The patient's case and plan of care was discussed with attending physician, Dr. Orie Fisherman.  Please see problem based charting for assessment and plan.

## 2013-06-10 NOTE — Patient Instructions (Signed)
General Instructions: We will like to do an Xray on you today. Also it is very important you quit smoking. This is the cause of your cough. Also since you can not afford tessalon we will prescribe mucinex for you.    Please bring your medicines with you each time you come to clinic.  Medicines may include prescription medications, over-the-counter medications, herbal remedies, eye drops, vitamins, or other pills.   Progress Toward Treatment Goals:  Treatment Goal 04/04/2013  Blood pressure at goal  Stop smoking smoking the same amount    Self Care Goals & Plans:  Self Care Goal 04/04/2013  Manage my medications take my medicines as prescribed; bring my medications to every visit; refill my medications on time; follow the sick day instructions if I am sick  Monitor my health keep track of my blood pressure  Eat healthy foods eat more vegetables; eat fruit for snacks and desserts  Be physically active find an activity I enjoy    No flowsheet data found.   Care Management & Community Referrals:  Referral 09/14/2012  Referrals made for care management support none needed  Referrals made to community resources -

## 2013-06-10 NOTE — Assessment & Plan Note (Signed)
  Assessment: Progress toward smoking cessation:   Smoking same amount. Barriers to progress toward smoking cessation:   Says it helps with his depression. Comments: Pt not ready to quit. Still smoking 1PPD, despite cough, wheeze and SOB, and knowledge that he should quit.  Plan: Instruction/counseling given:  I counseled patient on the dangers of tobacco use, advised patient to stop smoking, and reviewed strategies to maximize success. Educational resources provided:    Self management tools provided:    Medications to assist with smoking cessation:  None- Pt not interested now, never got previous nicotine patches prescribed. Patient agreed to the following self-care plans for smoking cessation:    Other plans:

## 2013-06-10 NOTE — Assessment & Plan Note (Signed)
Appears stable, with chronic cough, wheeze and SOB- all stable over the past year. Pt is not compliant with his inhalers- does not take them everyday. Could not afford tessalon previously prescribed for cough. Still smoking 1PPD.    Plan- Ambulatory pulse ox today in clinic- 93%. - Chest xray- considering significant smoking hx with cough, will start with chest xray. - Counselled on adherence to inhalers- Advair and spiriva. - Dextromethophan- guaifenasin- 1 tablet daily.

## 2013-06-12 ENCOUNTER — Ambulatory Visit: Payer: Medicare HMO | Admitting: Internal Medicine

## 2013-06-13 ENCOUNTER — Other Ambulatory Visit: Payer: Self-pay | Admitting: Internal Medicine

## 2013-06-13 ENCOUNTER — Telehealth: Payer: Self-pay | Admitting: *Deleted

## 2013-06-13 DIAGNOSIS — M25569 Pain in unspecified knee: Secondary | ICD-10-CM

## 2013-06-13 MED ORDER — NAPROXEN 500 MG PO TABS: 500.0000 mg | ORAL_TABLET | Freq: Two times a day (BID) | ORAL | Status: DC

## 2013-06-13 NOTE — Telephone Encounter (Signed)
Pt called asking for a refill on Naproxen 500 mg take 1 BID as needed. Pt has been on this med for inflammation since his knee replacement in 2013.  He only uses occasionally, this bottle has lasted 2 years.   Onset of pain after visit to clinic.  He had to climb over a wall while here, causing pain to knee area.       Pt # [id number]

## 2013-06-13 NOTE — Telephone Encounter (Signed)
I sent the Naproxen prescription to his pharmacy, 500 mg bid prn for knee pain.   Ivor Costa, MD PGY3, Internal Medicine Teaching Service Pager: [phone]

## 2013-08-01 ENCOUNTER — Encounter: Payer: Self-pay | Admitting: Internal Medicine

## 2013-08-01 ENCOUNTER — Ambulatory Visit (INDEPENDENT_AMBULATORY_CARE_PROVIDER_SITE_OTHER): Payer: Commercial Managed Care - HMO | Admitting: Internal Medicine

## 2013-08-01 VITALS — BP 163/88 | HR 66 | Temp 97.3°F | Ht 66.0 in | Wt 262.5 lb

## 2013-08-01 DIAGNOSIS — R05 Cough: Secondary | ICD-10-CM

## 2013-08-01 DIAGNOSIS — H109 Unspecified conjunctivitis: Secondary | ICD-10-CM

## 2013-08-01 DIAGNOSIS — R059 Cough, unspecified: Secondary | ICD-10-CM

## 2013-08-01 DIAGNOSIS — J449 Chronic obstructive pulmonary disease, unspecified: Secondary | ICD-10-CM

## 2013-08-01 DIAGNOSIS — J42 Unspecified chronic bronchitis: Secondary | ICD-10-CM

## 2013-08-01 DIAGNOSIS — J4489 Other specified chronic obstructive pulmonary disease: Secondary | ICD-10-CM

## 2013-08-01 MED ORDER — FLUTICASONE-SALMETEROL 250-50 MCG/DOSE IN AEPB: 1.0000 | INHALATION_SPRAY | Freq: Two times a day (BID) | RESPIRATORY_TRACT | Status: DC

## 2013-08-01 MED ORDER — FLUTICASONE PROPIONATE 50 MCG/ACT NA SUSP: 2.0000 | Freq: Every day | NASAL | Status: DC

## 2013-08-01 MED ORDER — ALBUTEROL SULFATE HFA 108 (90 BASE) MCG/ACT IN AERS: 2.0000 | INHALATION_SPRAY | RESPIRATORY_TRACT | Status: DC | PRN

## 2013-08-01 MED ORDER — ALBUTEROL SULFATE HFA 108 (90 BASE) MCG/ACT IN AERS: 2.0000 | INHALATION_SPRAY | RESPIRATORY_TRACT | Status: AC | PRN

## 2013-08-01 NOTE — Progress Notes (Signed)
   Subjective:    Patient ID: [name], male    DOB: Oct 12, 1945, 68 y.o.   MRN: [id number]  HPI Comments: Mr. Kenderick is a 68 year old male with a PMH of HTN, COPD, HCV and GERD.  He presents with c/o headache, frontal x 3 days.  H/A pain 6/10.  Denies trauma, change in vision, fever/chills, N/V, lightheadedness, dizziness or syncope, jaw pain or claudication. Has tried Aleve, Vicodin without relief.  Also with dyspnea x 1 week and resumed his inhalers at that time (he had not been using them regularly). Still with dry cough, worse at night.  No mucous production.  No sick contacts.  He denies PND, orthopnea or lower extremity edema.  He is still smoking 1 PPD.  He says he is thinking of quitting.       Review of Systems  Constitutional: Positive for fatigue. Negative for fever, chills and appetite change.  HENT: Positive for congestion, postnasal drip and sinus pressure. Negative for ear discharge, ear pain, facial swelling, rhinorrhea, sneezing, sore throat and tinnitus.   Eyes: Negative for visual disturbance.  Respiratory: Positive for cough, shortness of breath and wheezing.   Cardiovascular: Negative for chest pain, palpitations and leg swelling.  Gastrointestinal: Negative for nausea, vomiting, diarrhea, constipation and blood in stool.  Genitourinary: Negative for dysuria and frequency.  Neurological: Negative for dizziness and light-headedness.       Objective:   Physical Exam  Vitals reviewed. Constitutional: He is oriented to person, place, and time. He appears well-developed. No distress.  HENT:  Head: Normocephalic and atraumatic.  Right Ear: External ear normal.  Left Ear: External ear normal.  Mouth/Throat: Oropharynx is clear and moist. No oropharyngeal exudate.  Sinuses and temples are not tender to palpation TMs partially obscured by earwax but appear translucent, not erythematous or bulging.  Eyes: EOM are normal. Right eye exhibits no discharge. Left eye  exhibits no discharge.  B/L conjunctiva and sclera erythematous/hyperemic; pupils miotic - difficult to determine reactivity  Cardiovascular: Normal rate, regular rhythm and normal heart sounds.  Exam reveals no gallop and no friction rub.   No murmur heard. Pulmonary/Chest: Effort normal and breath sounds normal. No respiratory distress. He has no wheezes. He has no rales.  Abdominal: Soft. Bowel sounds are normal. He exhibits no distension. There is no tenderness.  Musculoskeletal: Normal range of motion. He exhibits no edema and no tenderness.  Lymphadenopathy:    He has no cervical adenopathy.  Neurological: He is alert and oriented to person, place, and time. No cranial nerve deficit.  Skin: Skin is warm. He is not diaphoretic.  Psychiatric: He has a normal mood and affect. His behavior is normal.          Assessment & Plan:  Please see problem based assessment and plan.

## 2013-08-01 NOTE — Patient Instructions (Signed)
1. Please call your eye doctor and ask to be seen earlier because I am worried your eye may still be infected.     2. Please take all medications as prescribed, including Advair and Spiriva for your problems breathing.  Try Flonase nasal spray for allergy symptoms and your cough.    3. If you have worsening of your symptoms or new symptoms arise, please call the clinic ([phone]), or go to the ER immediately if symptoms are severe.

## 2013-08-02 DIAGNOSIS — H109 Unspecified conjunctivitis: Secondary | ICD-10-CM | POA: Insufficient documentation

## 2013-08-02 NOTE — Progress Notes (Signed)
Case discussed with Dr. Wilson soon after the resident saw the patient. We reviewed the resident's history and exam and pertinent patient test results. I agree with the assessment, diagnosis, and plan of care documented in the resident's note. 

## 2013-08-02 NOTE — Assessment & Plan Note (Addendum)
Stable.  Dyspnea likely 2/2 to non-compliance with inhalers and continued smoking.  Vitals stable in clinic.  He is in no respiratory distress, lungs are clear.  No PND, orthopnea or lower leg edema to suggest HF. - continue inhalers; refilled Advair and albuterol because he was out; he has enough Spiriva - advised to stop smoking

## 2013-08-02 NOTE — Assessment & Plan Note (Addendum)
Cough likely mulitfactorial 2/2 to chronic COPD (non-compliant with treatment), continue smoking (PPD) and allergy (postnasal drip, cough worse at night, no cold symptoms or fever).  CXR in May did not reveal etiology.  His cough is dry, no mucous production, lungs are clear on exam so I doubt acute exacerbation of COPD.  Vitals stable, no S&S of pneumonia.  Allergies likely contributing to frontal H/A.  No signs of sinusitis or GCA on exam. - continue Spiriva, Advair and prn albuterol; I explained that he has to take the maintenance medications everyday to control symptoms - add Flonase - advised to quit smoking

## 2013-08-02 NOTE — Assessment & Plan Note (Addendum)
B/L conjunctival and scleral erythema 2 months after cataract surgery.  He says he was diagnosed with an eye infection after the surgery but is unclear on the details.  He says he was given additional eyedrop when the infection was found.  He reports compliance with eye drops but then says he occassionally misses drops because he fall asleep.  He denies foreign body sensation, loss of vision (he drove himself to clinic today), photophobia or purulent drainage (sometimes has watery drainage).  He sometimes has crusting of the eyelids when he wakes in the morning.   He has frontal headache but denies eye pain.  EOMs inact, acuity intact (R 20/40, L 20/50; unknown baseline).  I doubt acute angle closure glaucoma since there is no vision change, eye pain, N/V or mid-dilated pupil and there is B/L involvement.  I doubt keratitis as he has no foreign body sensation and keeping his eyes open does not bother him.  This could be worsening infection (2/2 to inconsistent use of eye drops) vs ocular hyperemia (ADR of eye drops).   - patient instructed to call ophthalmologist today and ask for an appointment ASAP (current appt is in two weeks) - continue Vigamox and other drops unless otherwise instructed by ophtho

## 2013-08-21 ENCOUNTER — Encounter: Payer: Commercial Managed Care - HMO | Admitting: Internal Medicine

## 2013-09-08 ENCOUNTER — Other Ambulatory Visit: Payer: Self-pay | Admitting: Internal Medicine

## 2013-09-10 NOTE — Telephone Encounter (Signed)
Based upon monthly refills, patient appears to be taking this medication twice a day; it should be used on an as-needed basis.  Please call him and find out how he is taking it and for what type of pain.

## 2013-09-11 NOTE — Telephone Encounter (Signed)
He takes it for wrist pain, bilateral, he didn't want to come in but i did schedule him an appt

## 2013-09-12 ENCOUNTER — Ambulatory Visit: Payer: Commercial Managed Care - HMO | Admitting: Internal Medicine

## 2013-09-12 ENCOUNTER — Ambulatory Visit (INDEPENDENT_AMBULATORY_CARE_PROVIDER_SITE_OTHER): Payer: Commercial Managed Care - HMO | Admitting: Internal Medicine

## 2013-09-12 VITALS — BP 142/75 | HR 76 | Temp 97.0°F | Wt 266.1 lb

## 2013-09-12 DIAGNOSIS — F17200 Nicotine dependence, unspecified, uncomplicated: Secondary | ICD-10-CM

## 2013-09-12 DIAGNOSIS — M25531 Pain in right wrist: Secondary | ICD-10-CM | POA: Insufficient documentation

## 2013-09-12 DIAGNOSIS — M25539 Pain in unspecified wrist: Secondary | ICD-10-CM

## 2013-09-12 DIAGNOSIS — Z720 Tobacco use: Secondary | ICD-10-CM

## 2013-09-12 DIAGNOSIS — M25532 Pain in left wrist: Secondary | ICD-10-CM

## 2013-09-12 DIAGNOSIS — I10 Essential (primary) hypertension: Secondary | ICD-10-CM

## 2013-09-12 MED ORDER — NICOTINE 21 MG/24HR TD PT24: 21.0000 mg | MEDICATED_PATCH | TRANSDERMAL | Status: AC

## 2013-09-12 MED ORDER — TRAMADOL HCL 50 MG PO TABS: 50.0000 mg | ORAL_TABLET | Freq: Four times a day (QID) | ORAL | Status: AC | PRN

## 2013-09-12 NOTE — Progress Notes (Signed)
   Subjective:    Patient ID: [name], male    DOB: May 08, 1945, 68 y.o.   MRN: [id number]  HPI [name] is a 68yo man w/ PMHx of HTN, COPD, Hepatitis C, and tobacco abuse who presents today for the following:  1. Bilateral Wrist Pain: Pt reports he has pain in both of his wrists for 1 week. The pain in both wrists extends from the area between his thumb and pointer finger to the lateral side of his wrist.  He reports the pain is a 7-8/10 in severity, worse at night, and not responsive to aleve, tylenol, or ibuprofen. He states he had some previously prescribed oxycodone which alleviated his pain. He denies doing any repetitive movements, such as typing or work that requirements extensive vibration. He also notes body aches in his knees and shoulders. He states the pain is the worst when extending his wrists. He denies fever, chills, fatigue,  weakness, tingling and numbness sensation, changes in weight, and constipation.   2. Tobacco Abuse: Pt reports he previously smoked 2 ppd and now smokes 1 ppd. He notes SOB, productive cough with white mucus, and wheezing which is his baseline due to his COPD. He reports he would like to quit smoking but that it is difficult because he has been smoking for so long. He is interested in trying the nicotine patch.   Review of Systems General: Denies night sweats, changes in appetite HEENT: Denies headaches, ear pain, changes in vision, rhinorrhea, sore throat CV: Denies CP, palpitations, orthopnea Pulm: See HPI GI: Denies abdominal pain, nausea, vomiting, diarrhea, melena, hematochezia GU: Denies dysuria, hematuria, frequency Msk: Denies muscle cramps Neuro: See HPI Skin: Denies rashes, bruising    Objective:   Physical Exam General: appears stated age, sitting up in chair, NAD HEENT: [place]/AT, EOMI, PERRL, sclera anicteric, pharynx non-erythematous, mucus membranes moist Neck: supple, no JVD, no lymphadenopathy CV: RRR, normal S1/S2, no m/g/r Pulm:  diffuse end-expiratory wheezes heard Abd: BS+, soft, non-distended, non-tender Ext: warm, no edema, moves all. Thenar atrophy noted bilaterally. Pt tender to palpation in wrists bilaterally. Tenderness with dorsiflexion, plantarflexion, extension, and flexion of wrists. Phalen and Tinnel signs negative. Neuro: alert and oriented x 3, CNs II-XII intact, strength 5/5 in upper and lower extremities bilaterally       Assessment & Plan:

## 2013-09-12 NOTE — Telephone Encounter (Signed)
Dr Marinda Elk i scheduled the pt an appt and he didn't come, will you deny the script so i can clear it, thanks

## 2013-09-12 NOTE — Assessment & Plan Note (Signed)
  Assessment: Progress toward smoking cessation:  smoking less Barriers to progress toward smoking cessation:  lack of motivation to quit Comments: Pt smoking 1 ppd instead of 2 ppd. Would like to cut cigarettes down to 0.5 ppd.  Plan: Instruction/counseling given:  I counseled patient on the dangers of tobacco use, advised patient to stop smoking, and reviewed strategies to maximize success. Educational resources provided:    Self management tools provided:    Medications to assist with smoking cessation:  Nicotine Patch Patient agreed to the following self-care plans for smoking cessation: cut down the number of cigarettes smoked  Other plans: Pt plans to start using nicotine patch and cut cigarette use down to 0.5 ppd.

## 2013-09-12 NOTE — Assessment & Plan Note (Addendum)
Pt reports 1 week hx of bilateral wrist pain, as well as pain in his knees. Pt has thenar atrophy bilaterally. Concern for carpal tunnel syndrome vs. Osteoarthritis. Pt does not have classic symptoms for carpal tunnel as his pain in not in the distribution of CTS and Phalen and Tinnel signs negative. Concern for possible hypothyroidism causing symptoms, however patient does not have fatigue, weakness, constipation, weight changes, myxedema consistent with hypothyroidism. Last TSH 1.80 in 2012 (normal). Pt likely has osteoarthritis pain since he is experiencing pain in his knees as well.  - Start Tramadol 50 mg Q6H PRN  - Pt to follow-up in 1 month to see if symptoms improved

## 2013-09-12 NOTE — Assessment & Plan Note (Signed)
BP Readings from Last 3 Encounters:  09/12/13 142/75  08/01/13 163/88  06/10/13 140/82    Lab Results  Component Value Date   NA 139 02/28/2012   K 4.5 02/28/2012   CREATININE 0.90 05/16/2012    Assessment: Blood pressure control: mildly elevated Progress toward BP goal:  improved Comments:   Plan: Medications:  continue current medications Educational resources provided:   Self management tools provided:   Other plans: Pt close to BP goal. BP may be slightly elevated due to pain in wrists at this visit. Continue current medications.

## 2013-09-12 NOTE — Patient Instructions (Addendum)
It was a pleasure taking care of you today, [name].  Here is what we discussed today:  1. Wrist Pain - Likely osteoarthritis - Take Tramadol 50 mg every 6 hours AS NEEDED  2. Smoking - Start Nicotine patch - Continue cutting down cigarettes  General Instructions:   Please bring your medicines with you each time you come to clinic.  Medicines may include prescription medications, over-the-counter medications, herbal remedies, eye drops, vitamins, or other pills.   Progress Toward Treatment Goals:  Treatment Goal 04/04/2013  Blood pressure at goal  Stop smoking smoking the same amount    Self Care Goals & Plans:  Self Care Goal 08/01/2013  Manage my medications take my medicines as prescribed; bring my medications to every visit; refill my medications on time  Monitor my health bring my blood pressure log to each visit  Eat healthy foods eat more vegetables; eat foods that are low in salt; eat baked foods instead of fried foods  Be physically active (No Data)  Stop smoking (No Data)    No flowsheet data found.   Care Management & Community Referrals:  Referral 09/14/2012  Referrals made for care management support none needed  Referrals made to community resources -

## 2013-09-15 ENCOUNTER — Encounter: Payer: Self-pay | Admitting: Internal Medicine

## 2013-09-15 NOTE — Addendum Note (Signed)
Addended by: Oval Linsey D on: 09/15/2013 09:53 PM   Modules accepted: Level of Service

## 2013-09-15 NOTE — Progress Notes (Signed)
I saw and evaluated the patient.  I personally confirmed the key portions of Dr. Shela Commons history and exam and reviewed pertinent patient test results.  The assessment, diagnosis, and plan were formulated together and I agree with the documentation in the resident's note.

## 2013-09-16 NOTE — Telephone Encounter (Signed)
Please find out if patient is still taking naproxen, and if so, how often.  He was recently seen 8/20 by Dr. Arcelia Jew and started on tramadol.

## 2013-09-24 ENCOUNTER — Other Ambulatory Visit: Payer: Self-pay | Admitting: Internal Medicine

## 2013-09-24 DIAGNOSIS — R058 Other specified cough: Secondary | ICD-10-CM

## 2013-09-24 DIAGNOSIS — R05 Cough: Secondary | ICD-10-CM

## 2013-10-15 ENCOUNTER — Other Ambulatory Visit: Payer: Self-pay | Admitting: *Deleted

## 2013-10-15 DIAGNOSIS — M25561 Pain in right knee: Secondary | ICD-10-CM

## 2013-10-15 NOTE — Telephone Encounter (Signed)
Coming in 9/24 to see Dr Denton Brick.

## 2013-10-15 NOTE — Telephone Encounter (Signed)
Pt's wife states pt has tried OTC Tussi DM ; cautious about using any medication d/t pt having high BP; but OTC med has not helped. Requesting something else. Thanks

## 2013-10-15 NOTE — Telephone Encounter (Signed)
Also requesting Tussionex 10- 8 mg/62ml for cough or something else; states no fever, coughing up phlegm (white to yellowish).

## 2013-10-15 NOTE — Telephone Encounter (Signed)
Please ask him to come in to get evaluated Gayle. If OTC meds are not working we may need to work him up further

## 2013-10-15 NOTE — Telephone Encounter (Signed)
Vicoprofen not refilled.  He is not on a narcotic pain contract.  This medication was filled X 1 in September 2014 for #40 and has not been refilled since.  If he is having pain issues which have not responded to the Tramadol he was prescribed in August 2015 he will need to be re-evaluated.  Please call patient and tell him that he will require a re-evaluation of his pain before another pain medication is prescribed.  If he would like to schedule a non-overbook appointment, please do so (preferrably with his PCP if narcotics are going to be discussed).  With regards to his cough, he should start with an over the counter cough medication.

## 2013-10-17 ENCOUNTER — Ambulatory Visit: Payer: Commercial Managed Care - HMO | Admitting: Internal Medicine

## 2013-10-17 ENCOUNTER — Encounter: Payer: Self-pay | Admitting: Internal Medicine

## 2013-10-23 ENCOUNTER — Other Ambulatory Visit: Payer: Self-pay | Admitting: Internal Medicine

## 2013-10-23 NOTE — Telephone Encounter (Signed)
Please schedule an appointment with me when available.

## 2013-10-24 ENCOUNTER — Inpatient Hospital Stay (HOSPITAL_COMMUNITY)
Admission: EM | Admit: 2013-10-24 | Discharge: 2013-11-24 | DRG: 023 | Disposition: E | Payer: Medicare HMO | Attending: Neurosurgery | Admitting: Neurosurgery

## 2013-10-24 ENCOUNTER — Encounter (HOSPITAL_COMMUNITY): Payer: Self-pay | Admitting: Emergency Medicine

## 2013-10-24 ENCOUNTER — Emergency Department (HOSPITAL_COMMUNITY): Payer: Medicare HMO

## 2013-10-24 ENCOUNTER — Inpatient Hospital Stay (HOSPITAL_COMMUNITY): Payer: Medicare HMO

## 2013-10-24 DIAGNOSIS — Z7982 Long term (current) use of aspirin: Secondary | ICD-10-CM

## 2013-10-24 DIAGNOSIS — J438 Other emphysema: Secondary | ICD-10-CM

## 2013-10-24 DIAGNOSIS — G911 Obstructive hydrocephalus: Secondary | ICD-10-CM | POA: Diagnosis present

## 2013-10-24 DIAGNOSIS — J449 Chronic obstructive pulmonary disease, unspecified: Secondary | ICD-10-CM

## 2013-10-24 DIAGNOSIS — E876 Hypokalemia: Secondary | ICD-10-CM | POA: Diagnosis present

## 2013-10-24 DIAGNOSIS — I615 Nontraumatic intracerebral hemorrhage, intraventricular: Secondary | ICD-10-CM | POA: Diagnosis present

## 2013-10-24 DIAGNOSIS — I619 Nontraumatic intracerebral hemorrhage, unspecified: Secondary | ICD-10-CM | POA: Diagnosis present

## 2013-10-24 DIAGNOSIS — R4182 Altered mental status, unspecified: Secondary | ICD-10-CM | POA: Diagnosis present

## 2013-10-24 DIAGNOSIS — Z882 Allergy status to sulfonamides status: Secondary | ICD-10-CM

## 2013-10-24 DIAGNOSIS — H409 Unspecified glaucoma: Secondary | ICD-10-CM | POA: Diagnosis present

## 2013-10-24 DIAGNOSIS — I10 Essential (primary) hypertension: Secondary | ICD-10-CM | POA: Diagnosis present

## 2013-10-24 DIAGNOSIS — F17210 Nicotine dependence, cigarettes, uncomplicated: Secondary | ICD-10-CM | POA: Diagnosis present

## 2013-10-24 DIAGNOSIS — F329 Major depressive disorder, single episode, unspecified: Secondary | ICD-10-CM | POA: Diagnosis present

## 2013-10-24 DIAGNOSIS — Z880 Allergy status to penicillin: Secondary | ICD-10-CM

## 2013-10-24 DIAGNOSIS — J9601 Acute respiratory failure with hypoxia: Secondary | ICD-10-CM | POA: Diagnosis present

## 2013-10-24 DIAGNOSIS — Z515 Encounter for palliative care: Secondary | ICD-10-CM | POA: Diagnosis not present

## 2013-10-24 DIAGNOSIS — R40243 Glasgow coma scale score 3-8, unspecified time: Secondary | ICD-10-CM

## 2013-10-24 DIAGNOSIS — Z96651 Presence of right artificial knee joint: Secondary | ICD-10-CM | POA: Diagnosis present

## 2013-10-24 DIAGNOSIS — R739 Hyperglycemia, unspecified: Secondary | ICD-10-CM | POA: Diagnosis present

## 2013-10-24 DIAGNOSIS — K219 Gastro-esophageal reflux disease without esophagitis: Secondary | ICD-10-CM | POA: Diagnosis present

## 2013-10-24 DIAGNOSIS — R569 Unspecified convulsions: Secondary | ICD-10-CM | POA: Diagnosis present

## 2013-10-24 DIAGNOSIS — Z66 Do not resuscitate: Secondary | ICD-10-CM | POA: Diagnosis present

## 2013-10-24 DIAGNOSIS — G9340 Encephalopathy, unspecified: Secondary | ICD-10-CM | POA: Diagnosis not present

## 2013-10-24 DIAGNOSIS — E872 Acidosis: Secondary | ICD-10-CM | POA: Diagnosis present

## 2013-10-24 DIAGNOSIS — Z9911 Dependence on respirator [ventilator] status: Secondary | ICD-10-CM

## 2013-10-24 DIAGNOSIS — H9190 Unspecified hearing loss, unspecified ear: Secondary | ICD-10-CM | POA: Diagnosis present

## 2013-10-24 DIAGNOSIS — R4020 Unspecified coma: Secondary | ICD-10-CM | POA: Diagnosis present

## 2013-10-24 DIAGNOSIS — I611 Nontraumatic intracerebral hemorrhage in hemisphere, cortical: Secondary | ICD-10-CM

## 2013-10-24 DIAGNOSIS — E785 Hyperlipidemia, unspecified: Secondary | ICD-10-CM | POA: Diagnosis present

## 2013-10-24 DIAGNOSIS — F419 Anxiety disorder, unspecified: Secondary | ICD-10-CM | POA: Diagnosis present

## 2013-10-24 DIAGNOSIS — Z720 Tobacco use: Secondary | ICD-10-CM

## 2013-10-24 DIAGNOSIS — B1920 Unspecified viral hepatitis C without hepatic coma: Secondary | ICD-10-CM | POA: Diagnosis present

## 2013-10-24 LAB — CBC WITH DIFFERENTIAL/PLATELET
BASOS ABS: 0 10*3/uL (ref 0.0–0.1)
Basophils Relative: 0 % (ref 0–1)
Eosinophils Absolute: 0.6 10*3/uL (ref 0.0–0.7)
Eosinophils Relative: 4 % (ref 0–5)
HEMATOCRIT: 41 % (ref 39.0–52.0)
Hemoglobin: 13.8 g/dL (ref 13.0–17.0)
LYMPHS ABS: 5 10*3/uL — AB (ref 0.7–4.0)
Lymphocytes Relative: 35 % (ref 12–46)
MCH: 31.3 pg (ref 26.0–34.0)
MCHC: 33.7 g/dL (ref 30.0–36.0)
MCV: 93 fL (ref 78.0–100.0)
MONO ABS: 1.2 10*3/uL — AB (ref 0.1–1.0)
MONOS PCT: 8 % (ref 3–12)
NEUTROS ABS: 7.6 10*3/uL (ref 1.7–7.7)
Neutrophils Relative %: 53 % (ref 43–77)
PLATELETS: 276 10*3/uL (ref 150–400)
RBC: 4.41 MIL/uL (ref 4.22–5.81)
RDW: 13.9 % (ref 11.5–15.5)
WBC: 14.4 10*3/uL — AB (ref 4.0–10.5)

## 2013-10-24 LAB — BLOOD GAS, ARTERIAL
ACID-BASE DEFICIT: 5 mmol/L — AB (ref 0.0–2.0)
Bicarbonate: 20.8 mEq/L (ref 20.0–24.0)
Drawn by: 331761
FIO2: 1 %
LHR: 16 {breaths}/min
MECHVT: 550 mL
O2 Saturation: 98.6 %
PCO2 ART: 44.8 mmHg (ref 35.0–45.0)
PEEP: 5 cmH2O
PH ART: 7.281 — AB (ref 7.350–7.450)
Patient temperature: 96.8
TCO2: 22.2 mmol/L (ref 0–100)
pO2, Arterial: 169 mmHg — ABNORMAL HIGH (ref 80.0–100.0)

## 2013-10-24 LAB — URINALYSIS, ROUTINE W REFLEX MICROSCOPIC
Bilirubin Urine: NEGATIVE
GLUCOSE, UA: NEGATIVE mg/dL
Hgb urine dipstick: NEGATIVE
KETONES UR: NEGATIVE mg/dL
Leukocytes, UA: NEGATIVE
Nitrite: NEGATIVE
PROTEIN: NEGATIVE mg/dL
Specific Gravity, Urine: 1.012 (ref 1.005–1.030)
Urobilinogen, UA: 0.2 mg/dL (ref 0.0–1.0)
pH: 6 (ref 5.0–8.0)

## 2013-10-24 LAB — I-STAT ARTERIAL BLOOD GAS, ED
ACID-BASE DEFICIT: 5 mmol/L — AB (ref 0.0–2.0)
BICARBONATE: 23.5 meq/L (ref 20.0–24.0)
O2 Saturation: 99 %
TCO2: 25 mmol/L (ref 0–100)
pCO2 arterial: 51.6 mmHg — ABNORMAL HIGH (ref 35.0–45.0)
pH, Arterial: 7.257 — ABNORMAL LOW (ref 7.350–7.450)
pO2, Arterial: 187 mmHg — ABNORMAL HIGH (ref 80.0–100.0)

## 2013-10-24 LAB — TRIGLYCERIDES: Triglycerides: 190 mg/dL — ABNORMAL HIGH (ref <150)

## 2013-10-24 LAB — COMPREHENSIVE METABOLIC PANEL
ALBUMIN: 3.8 g/dL (ref 3.5–5.2)
ALT: 10 U/L (ref 0–53)
AST: 18 U/L (ref 0–37)
Alkaline Phosphatase: 104 U/L (ref 39–117)
Anion gap: 19 — ABNORMAL HIGH (ref 5–15)
BUN: 24 mg/dL — AB (ref 6–23)
CALCIUM: 8.9 mg/dL (ref 8.4–10.5)
CO2: 20 mEq/L (ref 19–32)
CREATININE: 1.28 mg/dL (ref 0.50–1.35)
Chloride: 102 mEq/L (ref 96–112)
GFR calc Af Amer: 65 mL/min — ABNORMAL LOW (ref >90)
GFR, EST NON AFRICAN AMERICAN: 56 mL/min — AB (ref >90)
Glucose, Bld: 142 mg/dL — ABNORMAL HIGH (ref 70–99)
Potassium: 3.3 mEq/L — ABNORMAL LOW (ref 3.7–5.3)
Sodium: 141 mEq/L (ref 137–147)
Total Bilirubin: 0.3 mg/dL (ref 0.3–1.2)
Total Protein: 8 g/dL (ref 6.0–8.3)

## 2013-10-24 LAB — RAPID URINE DRUG SCREEN, HOSP PERFORMED
Amphetamines: NOT DETECTED
Barbiturates: NOT DETECTED
Benzodiazepines: NOT DETECTED
Cocaine: NOT DETECTED
Opiates: NOT DETECTED
Tetrahydrocannabinol: POSITIVE — AB

## 2013-10-24 LAB — MRSA PCR SCREENING: MRSA by PCR: NEGATIVE

## 2013-10-24 LAB — I-STAT TROPONIN, ED: TROPONIN I, POC: 0 ng/mL (ref 0.00–0.08)

## 2013-10-24 LAB — CBG MONITORING, ED: GLUCOSE-CAPILLARY: 115 mg/dL — AB (ref 70–99)

## 2013-10-24 LAB — AMMONIA: Ammonia: 33 umol/L (ref 11–60)

## 2013-10-24 LAB — I-STAT CG4 LACTIC ACID, ED: Lactic Acid, Venous: 3.99 mmol/L — ABNORMAL HIGH (ref 0.5–2.2)

## 2013-10-24 LAB — GLUCOSE, CAPILLARY: GLUCOSE-CAPILLARY: 130 mg/dL — AB (ref 70–99)

## 2013-10-24 LAB — ETHANOL

## 2013-10-24 MED ORDER — PROPOFOL 10 MG/ML IV EMUL
0.0000 ug/kg/min | INTRAVENOUS | Status: DC
Start: 2013-10-24 — End: 2013-10-26
  Administered 2013-10-24: 30 ug/kg/min via INTRAVENOUS
  Administered 2013-10-24: 20 ug/kg/min via INTRAVENOUS
  Administered 2013-10-25 (×2): 30 ug/kg/min via INTRAVENOUS
  Administered 2013-10-25: 20 ug/kg/min via INTRAVENOUS
  Administered 2013-10-25: 30 ug/kg/min via INTRAVENOUS
  Filled 2013-10-24 (×5): qty 100

## 2013-10-24 MED ORDER — LIDOCAINE HCL (CARDIAC) 20 MG/ML IV SOLN
INTRAVENOUS | Status: AC
  Filled 2013-10-24: qty 5

## 2013-10-24 MED ORDER — INSULIN ASPART 100 UNIT/ML ~~LOC~~ SOLN
0.0000 [IU] | SUBCUTANEOUS | Status: DC
  Administered 2013-10-24 – 2013-10-25 (×4): 2 [IU] via SUBCUTANEOUS

## 2013-10-24 MED ORDER — SODIUM CHLORIDE 0.9 % IV SOLN
INTRAVENOUS | Status: DC
  Administered 2013-10-24 – 2013-10-26 (×5): via INTRAVENOUS

## 2013-10-24 MED ORDER — ACETAMINOPHEN 325 MG PO TABS
650.0000 mg | ORAL_TABLET | ORAL | Status: DC | PRN
  Administered 2013-10-25: 650 mg via ORAL
  Filled 2013-10-24: qty 2

## 2013-10-24 MED ORDER — PANTOPRAZOLE SODIUM 40 MG IV SOLR
40.0000 mg | Freq: Every day | INTRAVENOUS | Status: DC
  Administered 2013-10-24: 40 mg via INTRAVENOUS
  Filled 2013-10-24 (×3): qty 40

## 2013-10-24 MED ORDER — CETYLPYRIDINIUM CHLORIDE 0.05 % MT LIQD
7.0000 mL | Freq: Four times a day (QID) | OROMUCOSAL | Status: DC
  Administered 2013-10-25 – 2013-10-26 (×6): 7 mL via OROMUCOSAL

## 2013-10-24 MED ORDER — ROCURONIUM BROMIDE 50 MG/5ML IV SOLN
INTRAVENOUS | Status: AC | PRN
  Administered 2013-10-24: 100 mg via INTRAVENOUS

## 2013-10-24 MED ORDER — PROPOFOL 10 MG/ML IV EMUL
5.0000 ug/kg/min | Freq: Once | INTRAVENOUS | Status: AC
  Administered 2013-10-24: 5 ug/kg/min via INTRAVENOUS
  Filled 2013-10-24: qty 100

## 2013-10-24 MED ORDER — MORPHINE SULFATE 4 MG/ML IJ SOLN
8.0000 mg | INTRAMUSCULAR | Status: AC
  Administered 2013-10-24: 8 mg via INTRAVENOUS

## 2013-10-24 MED ORDER — ETOMIDATE 2 MG/ML IV SOLN
INTRAVENOUS | Status: AC | PRN
  Administered 2013-10-24: 20 mg via INTRAVENOUS

## 2013-10-24 MED ORDER — IPRATROPIUM BROMIDE 0.02 % IN SOLN: 0.5000 mg | RESPIRATORY_TRACT | Status: DC | PRN

## 2013-10-24 MED ORDER — CHLORHEXIDINE GLUCONATE 0.12 % MT SOLN
15.0000 mL | Freq: Two times a day (BID) | OROMUCOSAL | Status: DC
  Administered 2013-10-24 – 2013-10-25 (×3): 15 mL via OROMUCOSAL
  Filled 2013-10-24 (×2): qty 15

## 2013-10-24 MED ORDER — MANNITOL 20 % IV SOLN
100.0000 g | Freq: Once | INTRAVENOUS | Status: AC
Start: 2013-10-24 — End: 2013-10-24
  Administered 2013-10-24: 100 g via INTRAVENOUS
  Filled 2013-10-24: qty 500

## 2013-10-24 MED ORDER — ACETAMINOPHEN 650 MG RE SUPP: 650.0000 mg | RECTAL | Status: DC | PRN

## 2013-10-24 MED ORDER — NOREPINEPHRINE BITARTRATE 1 MG/ML IV SOLN
2.0000 ug/min | INTRAVENOUS | Status: DC
  Administered 2013-10-24: 4 ug/min via INTRAVENOUS
  Filled 2013-10-24: qty 4

## 2013-10-24 MED ORDER — FENTANYL CITRATE 0.05 MG/ML IJ SOLN: 50.0000 ug | INTRAMUSCULAR | Status: DC | PRN

## 2013-10-24 MED ORDER — MIDAZOLAM HCL 2 MG/2ML IJ SOLN: 1.0000 mg | INTRAMUSCULAR | Status: DC | PRN

## 2013-10-24 MED ORDER — VANCOMYCIN HCL IN DEXTROSE 1-5 GM/200ML-% IV SOLN
1000.0000 mg | Freq: Two times a day (BID) | INTRAVENOUS | Status: DC
  Administered 2013-10-25: 1000 mg via INTRAVENOUS
  Filled 2013-10-24 (×4): qty 200

## 2013-10-24 MED ORDER — VANCOMYCIN HCL 10 G IV SOLR
2000.0000 mg | Freq: Once | INTRAVENOUS | Status: AC
  Administered 2013-10-24: 2000 mg via INTRAVENOUS
  Filled 2013-10-24: qty 2000

## 2013-10-24 MED ORDER — ALBUTEROL SULFATE (2.5 MG/3ML) 0.083% IN NEBU: 2.5000 mg | INHALATION_SOLUTION | RESPIRATORY_TRACT | Status: DC | PRN

## 2013-10-24 MED ORDER — ROCURONIUM BROMIDE 50 MG/5ML IV SOLN
INTRAVENOUS | Status: AC
  Filled 2013-10-24: qty 2

## 2013-10-24 MED ORDER — SUCCINYLCHOLINE CHLORIDE 20 MG/ML IJ SOLN
INTRAMUSCULAR | Status: AC
  Filled 2013-10-24: qty 1

## 2013-10-24 MED ORDER — NICARDIPINE HCL IN NACL 20-0.86 MG/200ML-% IV SOLN
3.0000 mg/h | Freq: Once | INTRAVENOUS | Status: DC
  Filled 2013-10-24: qty 200

## 2013-10-24 MED ORDER — SENNOSIDES-DOCUSATE SODIUM 8.6-50 MG PO TABS
1.0000 | ORAL_TABLET | Freq: Two times a day (BID) | ORAL | Status: DC
  Administered 2013-10-25: 1 via ORAL
  Filled 2013-10-24 (×5): qty 1

## 2013-10-24 MED ORDER — LABETALOL HCL 5 MG/ML IV SOLN
10.0000 mg | INTRAVENOUS | Status: DC | PRN
  Filled 2013-10-24: qty 4

## 2013-10-24 MED ORDER — MANNITOL 25 % IV SOLN
100.0000 g | Freq: Once | INTRAVENOUS | Status: DC
  Filled 2013-10-24: qty 400

## 2013-10-24 MED ORDER — ETOMIDATE 2 MG/ML IV SOLN
INTRAVENOUS | Status: AC
  Filled 2013-10-24: qty 20

## 2013-10-24 MED ORDER — STROKE: EARLY STAGES OF RECOVERY BOOK
Freq: Once | Status: AC
  Administered 2013-10-24: 20:00:00
  Filled 2013-10-24: qty 1

## 2013-10-24 NOTE — ED Notes (Signed)
EMS- pt at home, choking on apple. Pt not breathing on EMS arrival, turned onto side and pt began snoring respirations. Pt refused nasal trumpet, lung sounds clear, O2 sat WDL. Pt withdraws from pain.

## 2013-10-24 NOTE — Consult Note (Signed)
ANTIBIOTIC CONSULT NOTE - INITIAL  Pharmacy Consult for Vancomycin Indication: prophylaxis  Allergies  Allergen Reactions  . Penicillins Shortness Of Breath and Rash  . Celecoxib     REACTION: strange behavior  . Sulfa Antibiotics   . Sulfonamide Derivatives     REACTION: itching    Patient Measurements: Height: 5\' 7"  (170.2 cm) Weight: 258 lb 9.6 oz (117.3 kg) IBW/kg (Calculated) : 66.1  Vital Signs: Temp: 95.7 F (35.4 C) (10/01 2115) BP: 69/44 mmHg (10/01 2115) Pulse Rate: 80 (10/01 2115) Intake/Output from previous day:   Intake/Output from this shift:    Labs:  Recent Labs  10/25/2013 1646  WBC 14.4*  HGB 13.8  PLT 276  CREATININE 1.28   Estimated Creatinine Clearance: 67.7 ml/min (by C-G formula based on Cr of 1.28).  Microbiology: Recent Results (from the past 720 hour(s))  MRSA PCR SCREENING     Status: None   Collection Time    10/25/2013  8:11 PM      Result Value Ref Range Status   MRSA by PCR NEGATIVE  NEGATIVE Final   Comment:            The GeneXpert MRSA Assay (FDA     approved for NASAL specimens     only), is one component of a     comprehensive MRSA colonization     surveillance program. It is not     intended to diagnose MRSA     infection nor to guide or     monitor treatment for     MRSA infections.    Medical History: Past Medical History  Diagnosis Date  . Abnormal liver function tests 01/2010     on hospital admission February 23, 2010 AP = 36, AST = 62, ALT = 40, GGT = 392  . COPD (chronic obstructive pulmonary disease)   . Hypertension   . Hyperlipidemia   . GERD (gastroesophageal reflux disease)   . Anxiety   . Depression   . Pruritus 06/2009     this is most likely secondary to cholestasis, last bilirubin 2.7 (02-25-2010)  . Hepatitis C 2012     a hepatitis panel done February 19, 1998 showed anti-HBc +, anti-HBV +, hep C ab reactive, February 23, 2010 hepatitis B DNA not detected, HCV RNA  VL = 444000  . Shortness of  breath     with exercise  . Cataract   . Glaucoma   . HOH (hard of hearing)    Assessment: 68yom brought to the ED after choking on an apple. Became progressively apneic and was intubated. CT head showed large ICH with intraventricular extension. He was taken to the OR emergently for ventriculostomy. He will begin vancomycin for infection prophylaxis. Renal insufficiency noted with sCr 1.28. Will use the obesity dosing nomogram given weight of 117kg.  Goal of Therapy:  Vancomycin trough level 10-15 mcg/ml  Plan:  1) Vancomycin 2g IV x 1 then 1g IV q12 2) Follow renal function, LOT, level if needed  Deboraha Sprang 11/12/2013,9:58 PM

## 2013-10-24 NOTE — ED Provider Notes (Addendum)
I saw and evaluated the patient, reviewed the resident's note and I agree with the findings and plan.   EKG Interpretation   Date/Time:  Thursday October 24 2013 16:35:07 EDT Ventricular Rate:  83 PR Interval:  162 QRS Duration: 100 QT Interval:  439 QTC Calculation: 516 R Axis:   7 Text Interpretation:  Sinus rhythm Prolonged QT interval Confirmed by  Rogene Houston  MD, Stylianos Stradling [phone]) on 11/08/2013 6:01:39 PM       Results for orders placed during the hospital encounter of 11/13/2013  CBC WITH DIFFERENTIAL      Result Value Ref Range   WBC 14.4 (*) 4.0 - 10.5 K/uL   RBC 4.41  4.22 - 5.81 MIL/uL   Hemoglobin 13.8  13.0 - 17.0 g/dL   HCT 41.0  39.0 - 52.0 %   MCV 93.0  78.0 - 100.0 fL   MCH 31.3  26.0 - 34.0 pg   MCHC 33.7  30.0 - 36.0 g/dL   RDW 13.9  11.5 - 15.5 %   Platelets 276  150 - 400 K/uL   Neutrophils Relative % 53  43 - 77 %   Lymphocytes Relative 35  12 - 46 %   Monocytes Relative 8  3 - 12 %   Eosinophils Relative 4  0 - 5 %   Basophils Relative 0  0 - 1 %   Neutro Abs 7.6  1.7 - 7.7 K/uL   Lymphs Abs 5.0 (*) 0.7 - 4.0 K/uL   Monocytes Absolute 1.2 (*) 0.1 - 1.0 K/uL   Eosinophils Absolute 0.6  0.0 - 0.7 K/uL   Basophils Absolute 0.0  0.0 - 0.1 K/uL   Smear Review MORPHOLOGY UNREMARKABLE    COMPREHENSIVE METABOLIC PANEL      Result Value Ref Range   Sodium 141  137 - 147 mEq/L   Potassium 3.3 (*) 3.7 - 5.3 mEq/L   Chloride 102  96 - 112 mEq/L   CO2 20  19 - 32 mEq/L   Glucose, Bld 142 (*) 70 - 99 mg/dL   BUN 24 (*) 6 - 23 mg/dL   Creatinine, Ser 1.28  0.50 - 1.35 mg/dL   Calcium 8.9  8.4 - 10.5 mg/dL   Total Protein 8.0  6.0 - 8.3 g/dL   Albumin 3.8  3.5 - 5.2 g/dL   AST 18  0 - 37 U/L   ALT 10  0 - 53 U/L   Alkaline Phosphatase 104  39 - 117 U/L   Total Bilirubin 0.3  0.3 - 1.2 mg/dL   GFR calc non Af Amer 56 (*) >90 mL/min   GFR calc Af Amer 65 (*) >90 mL/min   Anion gap 19 (*) 5 - 15  AMMONIA      Result Value Ref Range   Ammonia 33  11 - 60 umol/L   ETHANOL      Result Value Ref Range   Alcohol, Ethyl (B) <11  0 - 11 mg/dL  I-STAT TROPOININ, ED      Result Value Ref Range   Troponin i, poc 0.00  0.00 - 0.08 ng/mL   Comment 3           I-STAT CG4 LACTIC ACID, ED      Result Value Ref Range   Lactic Acid, Venous 3.99 (*) 0.5 - 2.2 mmol/L  CBG MONITORING, ED      Result Value Ref Range   Glucose-Capillary 115 (*) 70 - 99 mg/dL  I-STAT ARTERIAL BLOOD  GAS, ED      Result Value Ref Range   pH, Arterial 7.257 (*) 7.350 - 7.450   pCO2 arterial 51.6 (*) 35.0 - 45.0 mmHg   pO2, Arterial 187.0 (*) 80.0 - 100.0 mmHg   Bicarbonate 23.5  20.0 - 24.0 mEq/L   TCO2 25  0 - 100 mmol/L   O2 Saturation 99.0     Acid-base deficit 5.0 (*) 0.0 - 2.0 mmol/L   Patient temperature 35.2 C     Collection site RADIAL, ALLEN'S TEST ACCEPTABLE     Drawn by Operator     Sample type ARTERIAL     Dg Chest Portable 1 View  11/19/2013   CLINICAL DATA:  Choking.  Endotracheal tube placement.  EXAM: PORTABLE CHEST - 1 VIEW  COMPARISON:  Jun 10, 2013.  FINDINGS: Stable cardiomediastinal silhouette. Endotracheal tube is seen projected over the tracheal air shadow with distal tip 3 cm above the carina. Nasogastric tube is seen entering the stomach. No pneumothorax is noted. No significant pleural effusion is noted. Mild left basilar subsegmental atelectasis is noted. Bony thorax is intact.  IMPRESSION: Endotracheal tube is in grossly good position. Stable mild left basilar subsegmental atelectasis.   Electronically Signed   By: Sabino Dick M.D.   On: 11/11/2013 17:14    CRITICAL CARE Performed by: Fredia Sorrow Total critical care time: 30 Critical care time was exclusive of separately billable procedures and treating other patients. Critical care was necessary to treat or prevent imminent or life-threatening deterioration. Critical care was time spent personally by me on the following activities: development of treatment plan with patient and/or surrogate as  well as nursing, discussions with consultants, evaluation of patient's response to treatment, examination of patient, obtaining history from patient or surrogate, ordering and performing treatments and interventions, ordering and review of laboratory studies, ordering and review of radiographic studies, pulse oximetry and re-evaluation of patient's condition.  Patient seen by me. Brought in by EMS. The initial complaint was choking on an apple. Outpatient the density reading properly when EMS arrived. But then he began with snoring respirations. Patient never was completely alert. While in the emergency department patient had what appeared to be as a seizure. The patient was then intubated without any difficulties. Patient started on propofol drip. And the rest of the workup including head CT is pending. Chest x-ray shows no evidence of pneumonia at this point in time. Patient's blood gas showed a significant acidosis at her to be predominantly respiratory in relation. Patient's lactic acid was also elevated at 3.990 Scheie of the 4. Rest of electrolytes without significant abnormalities. Patient August will require admission and head CT will be very informing on the possible causes.   Fredia Sorrow, MD 11/10/2013 1803  Addendum:  Patient's head CT shows significant cerebral hemorrhage. With threatening herniation. Patient started on mannitol. Patient started also on Cardene. Contacted neurosurgery. They're evaluating the patient at this time. Spoke with the family and presented today. Grim picture. Stating this would be difficult to survive from this. Family understands.     Fredia Sorrow, MD 11/02/2013 1909

## 2013-10-24 NOTE — ED Notes (Signed)
While standing with patient, noted to stick tongue out and eyes deviated to the left side. No shaking noted. MD called to bedside.

## 2013-10-24 NOTE — ED Provider Notes (Signed)
CSN: [id number]     Arrival date & time 10/25/2013  1624 History   First MD Initiated Contact with Patient 11/09/2013 1636     Chief Complaint  Patient presents with  . Choking     (Consider location/radiation/quality/duration/timing/severity/associated sxs/prior Treatment) HPI Comments: Pt is a 68 y.o. AAM w/ PMHx of HTN, COPD, HLD, Hep C w/ cc: of AMS. Hx per EMS. Patient was eating an apple today when he choked and became unresponsive. EMS called. When they got there he was snoring respirations and assisted with BVM. He gradually had return to consciousness but remianed Altered but was moving all extremities.  He vomited up apple afterwards. THere was no coughing witnessed per family or EMS. Patient cannot give any hx as he has AMS.  The history is provided by medical records. The history is limited by the condition of the patient.    Past Medical History  Diagnosis Date  . Abnormal liver function tests 01/2010     on hospital admission February 23, 2010 AP = 36, AST = 62, ALT = 40, GGT = 392  . COPD (chronic obstructive pulmonary disease)   . Hypertension   . Hyperlipidemia   . GERD (gastroesophageal reflux disease)   . Anxiety   . Depression   . Pruritus 06/2009     this is most likely secondary to cholestasis, last bilirubin 2.7 (02-25-2010)  . Hepatitis C 2012     a hepatitis panel done February 19, 1998 showed anti-HBc +, anti-HBV +, hep C ab reactive, February 23, 2010 hepatitis B DNA not detected, HCV RNA  VL = 444000  . Shortness of breath     with exercise  . Cataract   . Glaucoma   . HOH (hard of hearing)    Past Surgical History  Procedure Laterality Date  . Open anterior shoulder reconstruction  2003     surgery performed June 26, 2001 by Dr. Onnie Graham, open right shoulder anterior reconstruction secondary to recurrent right shoulder anterior inferior dislocation  . Knee arthroplasty  08/02/2011    Procedure: COMPUTER ASSISTED TOTAL KNEE ARTHROPLASTY;  Surgeon: Mcarthur Rossetti, MD;  Location: [place];  Service: Orthopedics;  Laterality: Right;  Right total knee arthroplasty  . Knee arthroscopy  10/01/2011    Procedure: ARTHROSCOPY KNEE;  Surgeon: Mcarthur Rossetti, MD;  Location: [facility];  Service: Orthopedics;  Laterality: Left;  with synevectomy   Family History  Problem Relation Age of Onset  . Stroke Neg Hx   . Cancer Neg Hx    History  Substance Use Topics  . Smoking status: Current Every Day Smoker -- 1.00 packs/day for 49 years    Types: Cigarettes  . Smokeless tobacco: Not on file     Comment: cuttin back from 1.5 pack/day/ SMOKES 1PPD  . Alcohol Use: No    Review of Systems  Unable to perform ROS: Mental status change      Allergies  Penicillins; Celecoxib; Sulfa antibiotics; and Sulfonamide derivatives  Home Medications   Prior to Admission medications   Medication Sig Start Date End Date Taking? Authorizing Provider  albuterol (VENTOLIN HFA) 108 (90 BASE) MCG/ACT inhaler Inhale 2 puffs into the lungs every 4 (four) hours as needed. For shortness of breath or wheezing. 08/01/13  Yes Francesca Oman, DO  aspirin 81 MG chewable tablet Chew 81 mg by mouth daily.   Yes Historical Provider, MD  chlorpheniramine (CHLOR-TRIMETON) 4 MG tablet Take 1 tablet (4 mg total) by mouth 2 (  two) times daily as needed for allergies. 04/04/13  Yes Valaria Good, MD  cholestyramine Lucrezia Starch) 4 G packet Take 1 packet by mouth 3 (three) times daily with meals. 02/08/12  Yes Ivor Costa, MD  dorzolamide (TRUSOPT) 2 % ophthalmic solution Place 1 drop into the left eye 3 (three) times daily.   Yes Historical Provider, MD  FLUoxetine (PROZAC) 20 MG capsule Take 20 mg by mouth daily.   Yes Historical Provider, MD  fluticasone (FLONASE) 50 MCG/ACT nasal spray Place 2 sprays into both nostrils daily. 09/25/13  Yes Karren Cobble, MD  Fluticasone-Salmeterol (ADVAIR DISKUS) 250-50 MCG/DOSE AEPB Inhale 1 puff into the lungs 2 (two) times daily. 10/23/13  Yes Axel Filler, MD  latanoprost (XALATAN) 0.005 % ophthalmic solution Place 1 drop into the right eye at bedtime.    Yes Historical Provider, MD  lisinopril-hydrochlorothiazide (PRINZIDE,ZESTORETIC) 10-12.5 MG per tablet Take 1 tablet by mouth 2 (two) times daily.   Yes Historical Provider, MD  nicotine (NICODERM CQ - DOSED IN MG/24 HOURS) 21 mg/24hr patch Place 1 patch (21 mg total) onto the skin daily. 09/12/13  Yes Carly Rivet, MD  omeprazole (PRILOSEC) 40 MG capsule Take 40 mg by mouth daily.   Yes Historical Provider, MD  traMADol (ULTRAM) 50 MG tablet Take 1 tablet (50 mg total) by mouth every 6 (six) hours as needed for moderate pain. 09/12/13 09/12/14 Yes Carly Rivet, MD  HARVONI 90-400 MG TABS  04/22/13   Historical Provider, MD   BP 109/56  Pulse 69  Temp(Src) 98.2 F (36.8 C)  Resp 16  Ht 5\' 7"  (1.702 m)  Wt 258 lb 9.6 oz (117.3 kg)  BMI 40.49 kg/m2  SpO2 98% Physical Exam  Vitals reviewed. Constitutional: He appears well-developed and well-nourished. He appears distressed.  HENT:  Head: Normocephalic and atraumatic.  Mouth/Throat: Oropharynx is clear and moist. No oropharyngeal exudate.  No sign of head trauma  Eyes: Conjunctivae and EOM are normal. Pupils are equal, round, and reactive to light. Right eye exhibits no discharge. Left eye exhibits no discharge. No scleral icterus.  Pupils 3 mm equal  Neck: Normal range of motion. Neck supple.  Cardiovascular: Normal rate, regular rhythm, normal heart sounds and intact distal pulses.  Exam reveals no gallop and no friction rub.   No murmur heard. Pulmonary/Chest: Effort normal and breath sounds normal. No respiratory distress. He has no wheezes. He has no rales.  Abdominal: Soft. He exhibits no distension and no mass.  Musculoskeletal: Normal range of motion.  Neurological:  Pt alert, but not speaking. With painful stimuli in all extremities, he localizes and will say "stop it". Does not follow verbal commands. Not moving extremities  without stimuli.   Skin: Skin is warm. No rash noted. He is not diaphoretic.    ED Course  INTUBATION Date/Time: 10/25/2013 12:07 AM Performed by: Sol Passer Authorized by: Fredia Sorrow Consent: The procedure was performed in an emergent situation. Required items: required blood products, implants, devices, and special equipment available Patient identity confirmed: anonymous protocol, patient vented/unresponsive Indications: airway protection Intubation method: direct Patient status: paralyzed (RSI) Preoxygenation: nonrebreather mask Sedatives: etomidate Paralytic: rocuronium Laryngoscope size: Mac 3 Tube size: 7.0 mm Tube type: cuffed Number of attempts: 1 Cricoid pressure: yes Cords visualized: yes Post-procedure assessment: chest rise and ETCO2 monitor Breath sounds: equal Cuff inflated: yes ETT to lip: 24 cm Tube secured with: ETT holder Chest x-ray interpreted by me, other physician and radiologist. Chest x-ray findings: endotracheal tube in  appropriate position Patient tolerance: Patient tolerated the procedure well with no immediate complications.   (including critical care time) Labs Review Labs Reviewed  CBC WITH DIFFERENTIAL - Abnormal; Notable for the following:    WBC 14.4 (*)    Lymphs Abs 5.0 (*)    Monocytes Absolute 1.2 (*)    All other components within normal limits  COMPREHENSIVE METABOLIC PANEL - Abnormal; Notable for the following:    Potassium 3.3 (*)    Glucose, Bld 142 (*)    BUN 24 (*)    GFR calc non Af Amer 56 (*)    GFR calc Af Amer 65 (*)    Anion gap 19 (*)    All other components within normal limits  URINE RAPID DRUG SCREEN (HOSP PERFORMED) - Abnormal; Notable for the following:    Tetrahydrocannabinol POSITIVE (*)    All other components within normal limits  TRIGLYCERIDES - Abnormal; Notable for the following:    Triglycerides 190 (*)    All other components within normal limits  BLOOD GAS, ARTERIAL - Abnormal; Notable  for the following:    pH, Arterial 7.281 (*)    pO2, Arterial 169.0 (*)    Acid-base deficit 5.0 (*)    All other components within normal limits  GLUCOSE, CAPILLARY - Abnormal; Notable for the following:    Glucose-Capillary 130 (*)    All other components within normal limits  I-STAT CG4 LACTIC ACID, ED - Abnormal; Notable for the following:    Lactic Acid, Venous 3.99 (*)    All other components within normal limits  CBG MONITORING, ED - Abnormal; Notable for the following:    Glucose-Capillary 115 (*)    All other components within normal limits  I-STAT ARTERIAL BLOOD GAS, ED - Abnormal; Notable for the following:    pH, Arterial 7.257 (*)    pCO2 arterial 51.6 (*)    pO2, Arterial 187.0 (*)    Acid-base deficit 5.0 (*)    All other components within normal limits  MRSA PCR SCREENING  URINE CULTURE  URINALYSIS, ROUTINE W REFLEX MICROSCOPIC  AMMONIA  ETHANOL  BLOOD GAS, ARTERIAL  BASIC METABOLIC PANEL  CBC  I-STAT TROPOININ, ED    Imaging Review Ct Head Wo Contrast  10/27/2013   CLINICAL DATA:  Choking, went unresponsive, vented  EXAM: CT HEAD WITHOUT CONTRAST  TECHNIQUE: Contiguous axial images were obtained from the base of the skull through the vertex without intravenous contrast.  COMPARISON:  None.  FINDINGS: There is a large right frontal intraparenchymal hematoma measuring 3 x 5 cm with intraventricular extension. There is a large amount of hemorrhage in the lateral ventricles, third ventricle and extending into the fourth ventricle. There is loss of the normal sulci consistent with increased intracranial pressure. There is crowding of the foramen magnum. There is 9 mm of right to left midline shift. The basal cisterns are effaced.  Visualized portions of the orbits are unremarkable. There is evidence of prior sinus surgery. There is mild left maxillary sinus and bilateral ethmoid sinus mucosal thickening.  The osseous structures are unremarkable.  IMPRESSION: 1. Large  right frontal intraparenchymal hematoma measuring 3 x 5 cm with intraventricular extension. Large amount of hemorrhage in the lateral ventricles, third ventricle and extending into the fourth ventricle. There is effacement of the normal sulci, crowding of the foramen magnum, effacement of the basal cisterns and 9 mm of right-to-left midline shift. Crowding of the foramen magnum is concerning for tonsillar herniation. Critical Value/emergent results were  called by telephone at the time of interpretation on 11/17/2013 at 6:21 pm to Dr. Sol Passer , who verbally acknowledged these results.   Electronically Signed   By: Kathreen Devoid   On: 11/10/2013 18:21   Dg Chest Port 1 View  11/18/2013   CLINICAL DATA:  Next central line placement.  Endotracheal tube.  EXAM: PORTABLE CHEST - 1 VIEW  COMPARISON:  11/16/2013  FINDINGS: Endotracheal tube tip measures 3.9 cm above the carina. Enteric tube tip is off the field of view but below the left hemidiaphragm. Interval placement of a right central venous catheter with tip over the low SVC region. No pneumothorax. Shallow inspiration with atelectasis in both lung bases. Probable small pleural effusions.  IMPRESSION: Appliances appear in satisfactory location. Shallow inspiration with atelectasis in the lung bases. Probable small pleural effusions.   Electronically Signed   By: Lucienne Capers M.D.   On: 11/20/2013 21:15   Dg Chest Portable 1 View  11/15/2013   CLINICAL DATA:  Choking.  Endotracheal tube placement.  EXAM: PORTABLE CHEST - 1 VIEW  COMPARISON:  Jun 10, 2013.  FINDINGS: Stable cardiomediastinal silhouette. Endotracheal tube is seen projected over the tracheal air shadow with distal tip 3 cm above the carina. Nasogastric tube is seen entering the stomach. No pneumothorax is noted. No significant pleural effusion is noted. Mild left basilar subsegmental atelectasis is noted. Bony thorax is intact.  IMPRESSION: Endotracheal tube is in grossly good position.  Stable mild left basilar subsegmental atelectasis.   Electronically Signed   By: Sabino Dick M.D.   On: 11/23/2013 17:14     EKG Interpretation   Date/Time:  Thursday October 24 2013 16:35:07 EDT Ventricular Rate:  83 PR Interval:  162 QRS Duration: 100 QT Interval:  439 QTC Calculation: 516 R Axis:   7 Text Interpretation:  Sinus rhythm Prolonged QT interval Confirmed by  Rogene Houston  MD, SCOTT ([id number]) on 11/06/2013 6:01:39 PM      MDM   MDM: 68 y.o. AAM w/ PMHX of HTN, Hep C, HLD, COPD w/ cC: of choking spell then AMS. Choked ona pple, then altered. Here he is not speaking unless with stim, but moving all exts. Initially altered, protecting airway. Vitals stable. Initial assessment performed, then patient had witnessed GTC seizure where he became post ictal after. Had x 2. After seizure vomited, and not clearing secretions. Not protecting airway. Intubated as above by RSI. Labs, urine, CXR, Head CT obtained. Head CT shows large IPH. Concern for herniation so mannitol given, hyperventilated on vent. D/w Dr. Arneta Cliche about Cardene but he did not recommend. [facility] ([facility]) consulted who saw patient and will admit for operative management. D/w family regarding diagnosis and grim prognosis. Admit.  Final diagnoses:  Nontraumatic intracerebral hemorrhage, unspecified cerebral location  Acute respiratory failure with hypoxia  Chronic obstructive pulmonary disease, unspecified COPD, unspecified chronic bronchitis type  Tobacco abuse  Respirator dependence  Glasgow coma scale total score 3-8    Admit to [facility]   Sol Passer, MD 10/25/13 0010

## 2013-10-24 NOTE — H&P (Signed)
[name] is an 68 y.o. male.   Chief Complaint: weakness on left side, possible choking, decreased mental status HPI: whom was at home and thought to be choking on an "apple". His wife then tried to dislodge the food with various maneuvers, simultaneously a 911 call was placed. EMS arrived and according to wife she was told he still had some obstruction in his airway. He was brought to [place], where he was fairly quickly intubated. A head ct showed a large right frontal ICH, with casting of the lateral, lll, and lV ventricles. I was called for evaluation.   Past Medical History  Diagnosis Date  . Abnormal liver function tests 01/2010     on hospital admission February 23, 2010 AP = 36, AST = 62, ALT = 40, GGT = 392  . COPD (chronic obstructive pulmonary disease)   . Hypertension   . Hyperlipidemia   . GERD (gastroesophageal reflux disease)   . Anxiety   . Depression   . Pruritus 06/2009     this is most likely secondary to cholestasis, last bilirubin 2.7 (02-25-2010)  . Hepatitis C 2012     a hepatitis panel done February 19, 1998 showed anti-HBc +, anti-HBV +, hep C ab reactive, February 23, 2010 hepatitis B DNA not detected, HCV RNA  VL = 444000  . Shortness of breath     with exercise  . Cataract   . Glaucoma   . HOH (hard of hearing)     Past Surgical History  Procedure Laterality Date  . Open anterior shoulder reconstruction  2003     surgery performed June 26, 2001 by Dr. Supple, open right shoulder anterior reconstruction secondary to recurrent right shoulder anterior inferior dislocation  . Knee arthroplasty  08/02/2011    Procedure: COMPUTER ASSISTED TOTAL KNEE ARTHROPLASTY;  Surgeon: Christopher Y Blackman, MD;  Location: [facility] OR;  Service: Orthopedics;  Laterality: Right;  Right total knee arthroplasty  . Knee arthroscopy  10/01/2011    Procedure: ARTHROSCOPY KNEE;  Surgeon: Christopher Y Blackman, MD;  Location: [facility] ORS;  Service: Orthopedics;  Laterality: Left;  with synevectomy     Family History  Problem Relation Age of Onset  . Stroke Neg Hx   . Cancer Neg Hx    Social History:  reports that he has been smoking Cigarettes.  He has a 49 pack-year smoking history. He does not have any smokeless tobacco history on file. He reports that he does not drink alcohol or use illicit drugs.  Allergies:  Allergies  Allergen Reactions  . Penicillins Shortness Of Breath and Rash  . Celecoxib     REACTION: strange behavior  . Sulfa Antibiotics   . Sulfonamide Derivatives     REACTION: itching    Medications Prior to Admission  Medication Sig Dispense Refill  . albuterol (VENTOLIN HFA) 108 (90 BASE) MCG/ACT inhaler Inhale 2 puffs into the lungs every 4 (four) hours as needed. For shortness of breath or wheezing.  1 Inhaler  5  . aspirin 81 MG chewable tablet Chew 81 mg by mouth daily.      . chlorpheniramine (CHLOR-TRIMETON) 4 MG tablet Take 1 tablet (4 mg total) by mouth 2 (two) times daily as needed for allergies.  14 tablet  0  . cholestyramine (QUESTRAN) 4 G packet Take 1 packet by mouth 3 (three) times daily with meals.  60 each  10  . dorzolamide (TRUSOPT) 2 % ophthalmic solution Place 1 drop into the left eye 3 (three)   times daily.      Marland Kitchen FLUoxetine (PROZAC) 20 MG capsule Take 20 mg by mouth daily.      . fluticasone (FLONASE) 50 MCG/ACT nasal spray Place 2 sprays into both nostrils daily.  16 g  3  . Fluticasone-Salmeterol (ADVAIR DISKUS) 250-50 MCG/DOSE AEPB Inhale 1 puff into the lungs 2 (two) times daily.  60 each  1  . latanoprost (XALATAN) 0.005 % ophthalmic solution Place 1 drop into the right eye at bedtime.       Marland Kitchen lisinopril-hydrochlorothiazide (PRINZIDE,ZESTORETIC) 10-12.5 MG per tablet Take 1 tablet by mouth 2 (two) times daily.      . nicotine (NICODERM CQ - DOSED IN MG/24 HOURS) 21 mg/24hr patch Place 1 patch (21 mg total) onto the skin daily.  30 patch  2  . omeprazole (PRILOSEC) 40 MG capsule Take 40 mg by mouth daily.      . traMADol (ULTRAM) 50  MG tablet Take 1 tablet (50 mg total) by mouth every 6 (six) hours as needed for moderate pain.  20 tablet  0  . HARVONI 90-400 MG TABS         Results for orders placed during the hospital encounter of 11/16/2013 (from the past 48 hour(s))  CBG MONITORING, ED     Status: Abnormal   Collection Time    11/07/2013  4:41 PM      Result Value Ref Range   Glucose-Capillary 115 (*) 70 - 99 mg/dL  CBC WITH DIFFERENTIAL     Status: Abnormal   Collection Time    11/22/2013  4:46 PM      Result Value Ref Range   WBC 14.4 (*) 4.0 - 10.5 K/uL   RBC 4.41  4.22 - 5.81 MIL/uL   Hemoglobin 13.8  13.0 - 17.0 g/dL   HCT 41.0  39.0 - 52.0 %   MCV 93.0  78.0 - 100.0 fL   MCH 31.3  26.0 - 34.0 pg   MCHC 33.7  30.0 - 36.0 g/dL   RDW 13.9  11.5 - 15.5 %   Platelets 276  150 - 400 K/uL   Neutrophils Relative % 53  43 - 77 %   Lymphocytes Relative 35  12 - 46 %   Monocytes Relative 8  3 - 12 %   Eosinophils Relative 4  0 - 5 %   Basophils Relative 0  0 - 1 %   Neutro Abs 7.6  1.7 - 7.7 K/uL   Lymphs Abs 5.0 (*) 0.7 - 4.0 K/uL   Monocytes Absolute 1.2 (*) 0.1 - 1.0 K/uL   Eosinophils Absolute 0.6  0.0 - 0.7 K/uL   Basophils Absolute 0.0  0.0 - 0.1 K/uL   Smear Review MORPHOLOGY UNREMARKABLE    COMPREHENSIVE METABOLIC PANEL     Status: Abnormal   Collection Time    11/20/2013  4:46 PM      Result Value Ref Range   Sodium 141  137 - 147 mEq/L   Potassium 3.3 (*) 3.7 - 5.3 mEq/L   Chloride 102  96 - 112 mEq/L   CO2 20  19 - 32 mEq/L   Glucose, Bld 142 (*) 70 - 99 mg/dL   BUN 24 (*) 6 - 23 mg/dL   Creatinine, Ser 1.28  0.50 - 1.35 mg/dL   Calcium 8.9  8.4 - 10.5 mg/dL   Total Protein 8.0  6.0 - 8.3 g/dL   Albumin 3.8  3.5 - 5.2 g/dL   AST 18  0 - 37 U/L   ALT 10  0 - 53 U/L   Alkaline Phosphatase 104  39 - 117 U/L   Total Bilirubin 0.3  0.3 - 1.2 mg/dL   GFR calc non Af Amer 56 (*) >90 mL/min   GFR calc Af Amer 65 (*) >90 mL/min   Comment: (NOTE)     The eGFR has been calculated using the CKD EPI  equation.     This calculation has not been validated in all clinical situations.     eGFR's persistently <90 mL/min signify possible Chronic Kidney     Disease.   Anion gap 19 (*) 5 - 15  AMMONIA     Status: None   Collection Time    11/05/2013  4:46 PM      Result Value Ref Range   Ammonia 33  11 - 60 umol/L  ETHANOL     Status: None   Collection Time    11/07/2013  4:46 PM      Result Value Ref Range   Alcohol, Ethyl (B) <11  0 - 11 mg/dL   Comment:            LOWEST DETECTABLE LIMIT FOR     SERUM ALCOHOL IS 11 mg/dL     FOR MEDICAL PURPOSES ONLY  I-STAT TROPOININ, ED     Status: None   Collection Time    11/16/2013  5:02 PM      Result Value Ref Range   Troponin i, poc 0.00  0.00 - 0.08 ng/mL   Comment 3            Comment: Due to the release kinetics of cTnI,     a negative result within the first hours     of the onset of symptoms does not rule out     myocardial infarction with certainty.     If myocardial infarction is still suspected,     repeat the test at appropriate intervals.  I-STAT CG4 LACTIC ACID, ED     Status: Abnormal   Collection Time    11/12/2013  5:04 PM      Result Value Ref Range   Lactic Acid, Venous 3.99 (*) 0.5 - 2.2 mmol/L  I-STAT ARTERIAL BLOOD GAS, ED     Status: Abnormal   Collection Time    11/02/2013  5:37 PM      Result Value Ref Range   pH, Arterial 7.257 (*) 7.350 - 7.450   pCO2 arterial 51.6 (*) 35.0 - 45.0 mmHg   pO2, Arterial 187.0 (*) 80.0 - 100.0 mmHg   Bicarbonate 23.5  20.0 - 24.0 mEq/L   TCO2 25  0 - 100 mmol/L   O2 Saturation 99.0     Acid-base deficit 5.0 (*) 0.0 - 2.0 mmol/L   Patient temperature 35.2 C     Collection site RADIAL, ALLEN'S TEST ACCEPTABLE     Drawn by Operator     Sample type ARTERIAL    URINALYSIS, ROUTINE W REFLEX MICROSCOPIC     Status: None   Collection Time    11/22/2013  5:53 PM      Result Value Ref Range   Color, Urine YELLOW  YELLOW   APPearance CLEAR  CLEAR   Specific Gravity, Urine 1.012  1.005 -  1.030   pH 6.0  5.0 - 8.0   Glucose, UA NEGATIVE  NEGATIVE mg/dL   Hgb urine dipstick NEGATIVE  NEGATIVE   Bilirubin Urine NEGATIVE  NEGATIVE  Ketones, ur NEGATIVE  NEGATIVE mg/dL   Protein, ur NEGATIVE  NEGATIVE mg/dL   Urobilinogen, UA 0.2  0.0 - 1.0 mg/dL   Nitrite NEGATIVE  NEGATIVE   Leukocytes, UA NEGATIVE  NEGATIVE   Comment: MICROSCOPIC NOT DONE ON URINES WITH NEGATIVE PROTEIN, BLOOD, LEUKOCYTES, NITRITE, OR GLUCOSE <1000 mg/dL.  URINE RAPID DRUG SCREEN (HOSP PERFORMED)     Status: Abnormal   Collection Time    10/28/2013  5:53 PM      Result Value Ref Range   Opiates NONE DETECTED  NONE DETECTED   Cocaine NONE DETECTED  NONE DETECTED   Benzodiazepines NONE DETECTED  NONE DETECTED   Amphetamines NONE DETECTED  NONE DETECTED   Tetrahydrocannabinol POSITIVE (*) NONE DETECTED   Barbiturates NONE DETECTED  NONE DETECTED   Comment:            DRUG SCREEN FOR MEDICAL PURPOSES     ONLY.  IF CONFIRMATION IS NEEDED     FOR ANY PURPOSE, NOTIFY LAB     WITHIN 5 DAYS.                LOWEST DETECTABLE LIMITS     FOR URINE DRUG SCREEN     Drug Class       Cutoff (ng/mL)     Amphetamine      1000     Barbiturate      200     Benzodiazepine   573     Tricyclics       220     Opiates          300     Cocaine          300     THC              50   Ct Head Wo Contrast  10/25/2013   CLINICAL DATA:  Choking, went unresponsive, vented  EXAM: CT HEAD WITHOUT CONTRAST  TECHNIQUE: Contiguous axial images were obtained from the base of the skull through the vertex without intravenous contrast.  COMPARISON:  None.  FINDINGS: There is a large right frontal intraparenchymal hematoma measuring 3 x 5 cm with intraventricular extension. There is a large amount of hemorrhage in the lateral ventricles, third ventricle and extending into the fourth ventricle. There is loss of the normal sulci consistent with increased intracranial pressure. There is crowding of the foramen magnum. There is 9 mm of right  to left midline shift. The basal cisterns are effaced.  Visualized portions of the orbits are unremarkable. There is evidence of prior sinus surgery. There is mild left maxillary sinus and bilateral ethmoid sinus mucosal thickening.  The osseous structures are unremarkable.  IMPRESSION: 1. Large right frontal intraparenchymal hematoma measuring 3 x 5 cm with intraventricular extension. Large amount of hemorrhage in the lateral ventricles, third ventricle and extending into the fourth ventricle. There is effacement of the normal sulci, crowding of the foramen magnum, effacement of the basal cisterns and 9 mm of right-to-left midline shift. Crowding of the foramen magnum is concerning for tonsillar herniation. Critical Value/emergent results were called by telephone at the time of interpretation on 11/02/2013 at 6:21 pm to Dr. Sol Passer , who verbally acknowledged these results.   Electronically Signed   By: Kathreen Devoid   On: 11/13/2013 18:21   Dg Chest Port 1 View  11/08/2013   CLINICAL DATA:  Next central line placement.  Endotracheal tube.  EXAM: PORTABLE CHEST - 1 VIEW  COMPARISON:  11/07/2013  FINDINGS: Endotracheal tube tip measures 3.9 cm above the carina. Enteric tube tip is off the field of view but below the left hemidiaphragm. Interval placement of a right central venous catheter with tip over the low SVC region. No pneumothorax. Shallow inspiration with atelectasis in both lung bases. Probable small pleural effusions.  IMPRESSION: Appliances appear in satisfactory location. Shallow inspiration with atelectasis in the lung bases. Probable small pleural effusions.   Electronically Signed   By: Lucienne Capers M.D.   On: 11/09/2013 21:15   Dg Chest Portable 1 View  10/29/2013   CLINICAL DATA:  Choking.  Endotracheal tube placement.  EXAM: PORTABLE CHEST - 1 VIEW  COMPARISON:  Jun 10, 2013.  FINDINGS: Stable cardiomediastinal silhouette. Endotracheal tube is seen projected over the tracheal air  shadow with distal tip 3 cm above the carina. Nasogastric tube is seen entering the stomach. No pneumothorax is noted. No significant pleural effusion is noted. Mild left basilar subsegmental atelectasis is noted. Bony thorax is intact.  IMPRESSION: Endotracheal tube is in grossly good position. Stable mild left basilar subsegmental atelectasis.   Electronically Signed   By: Sabino Dick M.D.   On: 11/19/2013 17:14    Review of Systems  Unable to perform ROS: medical condition    Blood pressure 69/44, pulse 80, temperature 95.7 F (35.4 C), resp. rate 16, height 5' 7" (1.702 m), weight 117.3 kg (258 lb 9.6 oz), SpO2 95.00%. Physical Exam  Constitutional: He appears well-developed and well-nourished.  HENT:  Head: Normocephalic and atraumatic.  Eyes:  Pupils irregular, pinpoint in terms of size, not reactive   Neck:  Patient intubated and sedated.   Cardiovascular: Normal rate and regular rhythm.   Respiratory:  intubated  GI: Soft.  Musculoskeletal:  Cannot assess  Neurological: He is unresponsive. A cranial nerve deficit is present.  Patient intubated and sedated. Lying on stretcher Pupils pinpoint irregular, not reactive.  +cough, +gag +corneals, Moved head while I was placing ventricular catheter Not following commands     Assessment/Plan Right Frontal ICH, with intraventricular extension and obstructive hydrocephalus Taken emergently to NICU for ventricular catheter placement. No exam possible due to IV medications. No detailed pre intubation exam available to me when patient was assessed. Will be admitted to the NICU. Prognosis poor. Spoke with family before ventricular catheter placement.   Gaddiel Cullens L 11/18/2013, 9:39 PM

## 2013-10-24 NOTE — ED Notes (Signed)
Respiratory drawing ABG, preparing to take patient to CT. Going to CT 1.

## 2013-10-24 NOTE — Op Note (Signed)
*   No surgery found *  9:49 PM  PATIENT:  Drayce Tawil  68 y.o. male  PRE-OPERATIVE DIAGNOSIS:  Obstructive hydrocephalus, right frontal ich with intraventricular extension.  POST-OPERATIVE DIAGNOSIS:  Obstructive hydrocephalus, right frontal ich with intraventricular extension.  PROCEDURE: Left frontal Intraventricular catheter placement  SURGEON:  Ashok Pall MD  ASSISTANTS:none  ANESTHESIA:   local and IV sedation  EBL:     BLOOD ADMINISTERED:none  CELL SAVER GIVEN:none  COUNT:not applicable  DRAINS: Ventriculostomy Drain in the left frontal horn lateral ventricle   SPECIMEN:  No Specimen  DICTATION: Mr. Litzinger head was shaved then prepped in a sterile manner. I infiltrated 6cc lidocaine into the scalp at my planned incision site, and tunnel path. I draped his head in a sterile manner. I opened the skin with a 15 blade in a coronal manner. I then used a twist drill to create a burr hole in the left frontal bone. I opened the dura, then placed a ventricular catheter into the left frontal horn. I had brisk return of bloody fluid. I then tunneled the catheter caudally bringing it out of the scalp, and secured it at the exit site. I closed the frontal incision with interrupted sutures. I connected the catheter to a drainage system under sterile conditions, then placed a sterile dressing. The drainage system was opened and there was good flow of csf.   PLAN OF CARE: Admit to inpatient   PATIENT DISPOSITION:  PACU - hemodynamically stable.   Delay start of Pharmacological VTE agent (>24hrs) due to surgical blood loss or risk of bleeding:  yes

## 2013-10-24 NOTE — ED Notes (Signed)
Spoke with resident about cardene, reports neurosurgeon says to not start at this time.

## 2013-10-24 NOTE — Progress Notes (Signed)
Chaplain responded to call from ED concerning [name]. Family was already in consult B when I arrived.   When I walked in family mentioned that they asked to not call the Chaplain.   No support desired.   Page if needed/desired.  Laurance, Heide 11/12/2013 5:27 PM

## 2013-10-24 NOTE — ED Notes (Signed)
Neurosurgeon at bedside °

## 2013-10-24 NOTE — Procedures (Signed)
Central Venous Catheter Insertion Procedure Note Tylar Amborn [id number] 1945/07/13  Procedure: Insertion of Central Venous Catheter Indications: Drug and/or fluid administration  Procedure Details Consent: Unable to obtain consent because of altered level of consciousness. Time Out: Verified patient identification, verified procedure, site/side was marked, verified correct patient position, special equipment/implants available, medications/allergies/relevent history reviewed, required imaging and test results available.  Performed  Maximum sterile technique was used including antiseptics, cap, gloves, gown, hand hygiene, mask and sheet. Skin prep: Chlorhexidine; local anesthetic administered A antimicrobial bonded/coated triple lumen catheter was placed in the right internal jugular vein using the Seldinger technique.  Evaluation Blood flow good Complications: No apparent complications Patient did tolerate procedure well. Chest X-ray ordered to verify placement.  CXR: pending.  Performed by Montey Hora, PA-C using u/s guidance.  I was present for procedure.  Chesley Mires, MD [facility] Pulmonary/Critical Care 11/13/2013, 8:31 PM Pager:  [phone] After 3pm call: [phone]

## 2013-10-24 NOTE — Procedures (Signed)
Arterial Catheter Insertion Procedure Note [name] [id number] October 04, 1945  Procedure: Insertion of Arterial Catheter  Indications: Blood pressure monitoring and Frequent blood sampling  Procedure Details Consent: Unable to obtain consent because of emergent medical necessity. Pt. Intubated on ventilator. Time Out: Verified patient identification, verified procedure, site/side was marked, verified correct patient position, special equipment/implants available, medications/allergies/relevent history reviewed, required imaging and test results available.  Performed  Maximum sterile technique was used including cap, gloves, gown, hand hygiene, mask and sheet. Skin prep: Chlorhexidine; local anesthetic administered 20 gauge catheter was inserted into right radial artery using the Seldinger technique.  Evaluation Blood flow good; BP tracing good. Complications: No apparent complications.  Assisted by Alfred Levins, RRT, RCP   Belenda Cruise, Eddie North 11/23/2013

## 2013-10-24 NOTE — Consult Note (Signed)
PULMONARY / CRITICAL CARE MEDICINE   Name: [name] MRN: [id number] DOB: 14-Nov-1945    ADMISSION DATE:  11/20/2013 CONSULTATION DATE:  11/20/2013  REFERRING MD :  Dr. Christella Noa  CHIEF COMPLAINT:  ICH  INITIAL PRESENTATION:  68 year old male, choked on apple at home, apneic on EMS arrival and intubated in ED. CT scan shows large ICH with intraventricular extension. To ICU Dr Christella Noa emergently placed bedside ventriculostomy. PCCM asked to assist with vent management and ICU care.   STUDIES:  CT head 10/1 > Large right frontal ICH with intraventricular extension to all 4 ventricles. 61mm midline shift. Concerning for herniation.   SIGNIFICANT EVENTS: 10/1 admitted to ICU, ventriculostomy placed   HISTORY OF PRESENT ILLNESS:  67 year old male with PMH as below, which includes COPD, HTN, and Hepatitis C. He was at home eating an apple when he began to choke prompting EMS call. He was apneic upon EMS arrival. They placed him in a lateral recumbent position and he had snoring respirations. He was transported to ED where he had seizure like activity and was promptly intubated. CT scan of the head showed very large ICH with intraventricular hemorrhage, and signs of herniation. Neurosurgery was consulted in ED. Patient was brought to ICU where he had an emergent ventriculostomy placed by Dr. Christella Noa. PCCM has been asked to assist with ventilator and ICU management.   PAST MEDICAL HISTORY :   has a past medical history of Abnormal liver function tests (01/2010); COPD (chronic obstructive pulmonary disease); Hypertension; Hyperlipidemia; GERD (gastroesophageal reflux disease); Anxiety; Depression; Pruritus (06/2009); Hepatitis C (2012); Shortness of breath; Cataract; Glaucoma; and HOH (hard of hearing).  has past surgical history that includes Open anterior shoulder reconstruction (2003); Knee Arthroplasty (08/02/2011); and Knee arthroscopy (10/01/2011). Prior to Admission medications   Medication Sig  Start Date End Date Taking? Authorizing Provider  albuterol (VENTOLIN HFA) 108 (90 BASE) MCG/ACT inhaler Inhale 2 puffs into the lungs every 4 (four) hours as needed. For shortness of breath or wheezing. 08/01/13  Yes Francesca Oman, DO  aspirin 81 MG chewable tablet Chew 81 mg by mouth daily.   Yes Historical Provider, MD  chlorpheniramine (CHLOR-TRIMETON) 4 MG tablet Take 1 tablet (4 mg total) by mouth 2 (two) times daily as needed for allergies. 04/04/13  Yes Valaria Good, MD  cholestyramine Lucrezia Starch) 4 G packet Take 1 packet by mouth 3 (three) times daily with meals. 02/08/12  Yes Ivor Costa, MD  dorzolamide (TRUSOPT) 2 % ophthalmic solution Place 1 drop into the left eye 3 (three) times daily.   Yes Historical Provider, MD  FLUoxetine (PROZAC) 20 MG capsule Take 20 mg by mouth daily.   Yes Historical Provider, MD  fluticasone (FLONASE) 50 MCG/ACT nasal spray Place 2 sprays into both nostrils daily. 09/25/13  Yes Karren Cobble, MD  Fluticasone-Salmeterol (ADVAIR DISKUS) 250-50 MCG/DOSE AEPB Inhale 1 puff into the lungs 2 (two) times daily. 10/23/13  Yes Axel Filler, MD  latanoprost (XALATAN) 0.005 % ophthalmic solution Place 1 drop into the right eye at bedtime.    Yes Historical Provider, MD  lisinopril-hydrochlorothiazide (PRINZIDE,ZESTORETIC) 10-12.5 MG per tablet Take 1 tablet by mouth 2 (two) times daily.   Yes Historical Provider, MD  nicotine (NICODERM CQ - DOSED IN MG/24 HOURS) 21 mg/24hr patch Place 1 patch (21 mg total) onto the skin daily. 09/12/13  Yes Carly Rivet, MD  omeprazole (PRILOSEC) 40 MG capsule Take 40 mg by mouth daily.   Yes Historical Provider,  MD  traMADol (ULTRAM) 50 MG tablet Take 1 tablet (50 mg total) by mouth every 6 (six) hours as needed for moderate pain. 09/12/13 09/12/14 Yes Albin Felling, MD  HARVONI 90-400 MG TABS  04/22/13   Historical Provider, MD   Allergies  Allergen Reactions  . Penicillins Shortness Of Breath and Rash  . Celecoxib     REACTION:  strange behavior  . Sulfa Antibiotics   . Sulfonamide Derivatives     REACTION: itching    FAMILY HISTORY:  has no family status information on file.  SOCIAL HISTORY:  reports that he has been smoking Cigarettes.  He has a 49 pack-year smoking history. He does not have any smokeless tobacco history on file. He reports that he does not drink alcohol or use illicit drugs.  REVIEW OF SYSTEMS:  Unable due to encephalopahty  SUBJECTIVE:   VITAL SIGNS: Pulse Rate:  [75-101] 75 (10/01 1845) Resp:  [14-24] 18 (10/01 1845) BP: (152-208)/(82-136) 177/83 mmHg (10/01 1845) SpO2:  [91 %-100 %] 93 % (10/01 1845) FiO2 (%):  [60 %-100 %] 60 % (10/01 1915) HEMODYNAMICS:   VENTILATOR SETTINGS: Vent Mode:  [-] PRVC FiO2 (%):  [60 %-100 %] 60 % Set Rate:  [14 bmp-16 bmp] 16 bmp Vt Set:  [550 mL] 550 mL PEEP:  [5 cmH20] 5 cmH20 Plateau Pressure:  [16 cmH20-19 cmH20] 19 cmH20 INTAKE / OUTPUT: No intake or output data in the 24 hours ending 11/16/2013 1942  PHYSICAL EXAMINATION: General: no distress Neuro:  Comatose HEENT:  Pupils pinpoint and non reactive, ETT in place, ventriculostomy in place Cardiovascular:  Regular, no murmur Lungs:  Scattered rhonchi Abdomen:  Soft, non tender, + bowel sounds Musculoskeletal:  No edema Skin:  No rashes  LABS:  CBC  Recent Labs Lab 11/22/2013 1646  WBC 14.4*  HGB 13.8  HCT 41.0  PLT 276   Coag's No results found for this basename: APTT, INR,  in the last 168 hours  BMET  Recent Labs Lab 11/18/2013 1646  NA 141  K 3.3*  CL 102  CO2 20  BUN 24*  CREATININE 1.28  GLUCOSE 142*   Electrolytes  Recent Labs Lab 11/13/2013 1646  CALCIUM 8.9   Sepsis Markers  Recent Labs Lab 11/07/2013 1704  LATICACIDVEN 3.99*   ABG  Recent Labs Lab 11/20/2013 1737  PHART 7.257*  PCO2ART 51.6*  PO2ART 187.0*   Liver Enzymes  Recent Labs Lab 11/19/2013 1646  AST 18  ALT 10  ALKPHOS 104  BILITOT 0.3  ALBUMIN 3.8   Cardiac Enzymes No  results found for this basename: TROPONINI, PROBNP,  in the last 168 hours  Glucose  Recent Labs Lab 11/03/2013 1641  GLUCAP 115*    Imaging No results found.   ASSESSMENT / PLAN: NEUROLOGIC A:   Large right intracranial hemorrhage with intraventricular extension with increased ICP. Possible seizure on admission. P:   RASS goal: -1 Management per neurosurgery Defer addition of AED's to neurosurgery  PULMONARY OETT 10/1 >>> A:   Acute respiratory failure 2nd to inability to protect airway. Hx of COPD. Tobacco abuse. Respiratory acidosis P:   Full vent support Adjust RR/Vt to avoid acidosis F/u CXR, ABG PRN BD's  CARDIOVASCULAR CVL 10/1 >>> A:  Hx of HTN, HLD. P:  Monitor hemodynamics  RENAL A:   Hypokalemia. P:   F/u and replace electrolytes as needed Monitor renal fx, urine outpt  GASTROINTESTINAL A:   Nutrition. P:   NPO Protonix for SUP  HEMATOLOGIC A:  No acute issues. P:  F/u CBC SCD for DVT prevention  INFECTIOUS A:   No evidence for infection. P:   Monitor clinically  ENDOCRINE A:   Hyperglycemia >> no hx of DM. P:   SSI  Family updated:    Interdisciplinary Family Meeting v Palliative Care Meeting:   CC time 40 minutes.  Chesley Mires, MD [facility] Pulmonary/Critical Care 11/17/2013, 8:19 PM Pager:  [phone] After 3pm call: [phone]

## 2013-10-24 NOTE — ED Notes (Signed)
Dr. Rogene Houston at bedside, speaking with family.

## 2013-10-24 NOTE — ED Notes (Signed)
Family at bedside. 

## 2013-10-24 NOTE — ED Notes (Signed)
Resident & Dr. Rogene Houston at bedside, preparing to intubate.

## 2013-10-25 ENCOUNTER — Inpatient Hospital Stay (HOSPITAL_COMMUNITY): Payer: Medicare HMO

## 2013-10-25 DIAGNOSIS — I611 Nontraumatic intracerebral hemorrhage in hemisphere, cortical: Secondary | ICD-10-CM

## 2013-10-25 DIAGNOSIS — J438 Other emphysema: Secondary | ICD-10-CM

## 2013-10-25 LAB — GLUCOSE, CAPILLARY
GLUCOSE-CAPILLARY: 113 mg/dL — AB (ref 70–99)
Glucose-Capillary: 143 mg/dL — ABNORMAL HIGH (ref 70–99)
Glucose-Capillary: 124 mg/dL — ABNORMAL HIGH (ref 70–99)
Glucose-Capillary: 134 mg/dL — ABNORMAL HIGH (ref 70–99)
Glucose-Capillary: 109 mg/dL — ABNORMAL HIGH (ref 70–99)

## 2013-10-25 LAB — BASIC METABOLIC PANEL
Anion gap: 15 (ref 5–15)
BUN: 22 mg/dL (ref 6–23)
CO2: 21 meq/L (ref 19–32)
Calcium: 8.1 mg/dL — ABNORMAL LOW (ref 8.4–10.5)
Chloride: 102 mEq/L (ref 96–112)
Creatinine, Ser: 1.36 mg/dL — ABNORMAL HIGH (ref 0.50–1.35)
GFR calc Af Amer: 60 mL/min — ABNORMAL LOW (ref >90)
GFR, EST NON AFRICAN AMERICAN: 52 mL/min — AB (ref >90)
GLUCOSE: 120 mg/dL — AB (ref 70–99)
POTASSIUM: 4.4 meq/L (ref 3.7–5.3)
Sodium: 138 mEq/L (ref 137–147)

## 2013-10-25 LAB — BLOOD GAS, ARTERIAL
ACID-BASE DEFICIT: 3.2 mmol/L — AB (ref 0.0–2.0)
BICARBONATE: 21.4 meq/L (ref 20.0–24.0)
Drawn by: 30860
FIO2: 0.7 %
O2 Saturation: 98.1 %
PCO2 ART: 39.4 mmHg (ref 35.0–45.0)
PEEP: 5 cmH2O
Patient temperature: 98.6
RATE: 16 resp/min
TCO2: 22.6 mmol/L (ref 0–100)
VT: 550 mL
pH, Arterial: 7.355 (ref 7.350–7.450)
pO2, Arterial: 123 mmHg — ABNORMAL HIGH (ref 80.0–100.0)

## 2013-10-25 LAB — CBC
HEMATOCRIT: 36.6 % — AB (ref 39.0–52.0)
HEMOGLOBIN: 12.3 g/dL — AB (ref 13.0–17.0)
MCH: 30.7 pg (ref 26.0–34.0)
MCHC: 33.6 g/dL (ref 30.0–36.0)
MCV: 91.3 fL (ref 78.0–100.0)
PLATELETS: 250 10*3/uL (ref 150–400)
RBC: 4.01 MIL/uL — AB (ref 4.22–5.81)
RDW: 14.2 % (ref 11.5–15.5)
WBC: 17.2 10*3/uL — ABNORMAL HIGH (ref 4.0–10.5)

## 2013-10-25 LAB — URINE CULTURE
CULTURE: NO GROWTH
Colony Count: NO GROWTH

## 2013-10-25 MED ORDER — ADULT MULTIVITAMIN LIQUID CH
5.0000 mL | Freq: Every day | ORAL | Status: DC
  Filled 2013-10-25 (×2): qty 5

## 2013-10-25 MED ORDER — MORPHINE BOLUS VIA INFUSION
5.0000 mg | INTRAVENOUS | Status: DC | PRN
  Filled 2013-10-25: qty 20

## 2013-10-25 MED ORDER — HYDRALAZINE HCL 20 MG/ML IJ SOLN
INTRAMUSCULAR | Status: AC
  Filled 2013-10-25: qty 1

## 2013-10-25 MED ORDER — PRO-STAT SUGAR FREE PO LIQD
60.0000 mL | Freq: Every day | ORAL | Status: DC
  Filled 2013-10-25 (×5): qty 60

## 2013-10-25 MED ORDER — VITAL HIGH PROTEIN PO LIQD
1000.0000 mL | ORAL | Status: DC
  Filled 2013-10-25 (×2): qty 1000

## 2013-10-25 MED ORDER — HYDRALAZINE HCL 20 MG/ML IJ SOLN
10.0000 mg | INTRAMUSCULAR | Status: DC | PRN
  Administered 2013-10-25: 20 mg via INTRAVENOUS

## 2013-10-25 MED ORDER — MORPHINE SULFATE 10 MG/ML IJ SOLN
10.0000 mg/h | INTRAVENOUS | Status: DC
  Administered 2013-10-25: 10 mg/h via INTRAVENOUS
  Administered 2013-10-26: 15 mg/h via INTRAVENOUS
  Administered 2013-10-26: 12.5 mg/h via INTRAVENOUS
  Administered 2013-10-26: 18 mg/h via INTRAVENOUS
  Filled 2013-10-25 (×3): qty 10

## 2013-10-25 NOTE — Procedures (Signed)
Extubation Procedure Note  Patient Details:   Name: Kaspar Albornoz DOB: [birth year] MRN: [id number]   Airway Documentation:  Airway 7 mm (Active)  Secured at (cm) 26 cm 10/25/2013  4:00 PM  Measured From Lips 10/25/2013  4:00 PM  Secured Location Right 10/25/2013  3:16 PM  Secured By [employer] 10/25/2013  3:16 PM  Tube Holder Repositioned Yes 10/25/2013  3:16 PM  Cuff Pressure (cm H2O) 24 cm H2O 10/25/2013  3:16 PM  Site Condition Dry 10/25/2013  3:16 PM    Evaluation  O2 sats: stable throughout Complications: No apparent complications Patient did tolerate procedure well. Bilateral Breath Sounds: Rhonchi;Diminished Suctioning: Airway No  Pt. Was terminally extubated to a 2L [place] with family & RN at the bedside.   Claretta Fraise 10/25/2013, 8:38 PM

## 2013-10-25 NOTE — Progress Notes (Addendum)
INITIAL NUTRITION ASSESSMENT  DOCUMENTATION CODES Per approved criteria  -Morbid Obesity   INTERVENTION:  Initiate Vital High Protein @ 10 ml/hr via OG tube.   60 ml Prostat five times per day.  MVI daily    TF regimen providing 1240 kcal, 171 grams protein, and 200 ml H2O  TF regimen and propofol at current rate providing 1812 total kcal/day (27 kcal/kg IBW)  NUTRITION DIAGNOSIS: Inadequate oral intake related to inability to eat as evidenced by NPO status  Goal: Enteral nutrition to provide 60-70% of estimated calorie needs (22-25 kcals/kg ideal body weight) and 100% of estimated protein needs, based on ASPEN guidelines for permissive underfeeding in critically ill obese individuals  Monitor:  Respiratory status, plan of care, TF initiation, labs  Reason for Assessment: Ventilator; Consult received to initiate and manage enteral nutrition support.  68 y.o. male  Admitting Dx: ICH  ASSESSMENT: Pt admitted with large right frontal ICH. Pt s/p left frontal intraventricular catheter placement 10/1 and remains intubated. Repeat CT's pending.  Patient is currently intubated on ventilator support MV: 16 L/min Temp (24hrs), Avg:98.3 F (36.8 C), Min:95.4 F (35.2 C), Max:99.5 F (37.5 C)  Propofol: 21.7 ml/hr providing 572 kcal per day from lipid  No family present. No signs of fat or muscle depletion.   Height: Ht Readings from Last 1 Encounters:  11/20/2013 5\' 7"  (1.702 m)    Weight: Wt Readings from Last 1 Encounters:  10/25/2013 258 lb 9.6 oz (117.3 kg)    Ideal Body Weight: 67.2 kg   % Ideal Body Weight: 175%  Wt Readings from Last 10 Encounters:  11/03/2013 258 lb 9.6 oz (117.3 kg)  09/12/13 266 lb 1.6 oz (120.702 kg)  08/01/13 262 lb 8 oz (119.069 kg)  06/10/13 258 lb (117.028 kg)  04/04/13 251 lb (113.853 kg)  09/28/12 234 lb 8 oz (106.369 kg)  09/14/12 239 lb 12.8 oz (108.773 kg)  05/28/12 235 lb 4.8 oz (106.731 kg)  02/28/12 231 lb (104.781 kg)   02/08/12 233 lb 14.4 oz (106.096 kg)    Usual Body Weight: 250-260 lb   % Usual Body Weight: 100%  BMI:  Body mass index is 40.49 kg/(m^2).  Estimated Nutritional Needs: Kcal: 2259 Protein: >/= 168 grams Fluid: > 2 L/day  Skin: intact  Diet Order: NPO  EDUCATION NEEDS: -No education needs identified at this time   Intake/Output Summary (Last 24 hours) at 10/25/13 0910 Last data filed at 10/25/13 0824  Gross per 24 hour  Intake 1743.58 ml  Output   2968 ml  Net -1224.42 ml    Last BM: PTA   Labs:   Recent Labs Lab 11/02/2013 1646 10/25/13 0315  NA 141 138  K 3.3* 4.4  CL 102 102  CO2 20 21  BUN 24* 22  CREATININE 1.28 1.36*  CALCIUM 8.9 8.1*  GLUCOSE 142* 120*    CBG (last 3)   Recent Labs  11/23/2013 2342 10/25/13 0335 10/25/13 0831  GLUCAP 130* 113* 124*    Scheduled Meds: . antiseptic oral rinse  7 mL Mouth Rinse QID  . chlorhexidine  15 mL Mouth Rinse BID  . insulin aspart  0-15 Units Subcutaneous 6 times per day  . niCARDipine  3-15 mg/hr Intravenous Once  . pantoprazole (PROTONIX) IV  40 mg Intravenous QHS  . senna-docusate  1 tablet Oral BID  . vancomycin  1,000 mg Intravenous Q12H    Continuous Infusions: . sodium chloride 80 mL/hr at 10/25/13 0800  . norepinephrine (LEVOPHED)  Adult infusion Stopped (10/25/13 0337)  . propofol 30 mcg/kg/min (10/25/13 0177)    Past Medical History  Diagnosis Date  . Abnormal liver function tests 01/2010     on hospital admission February 23, 2010 AP = 36, AST = 62, ALT = 40, GGT = 392  . COPD (chronic obstructive pulmonary disease)   . Hypertension   . Hyperlipidemia   . GERD (gastroesophageal reflux disease)   . Anxiety   . Depression   . Pruritus 06/2009     this is most likely secondary to cholestasis, last bilirubin 2.7 (02-25-2010)  . Hepatitis C 2012     a hepatitis panel done February 19, 1998 showed anti-HBc +, anti-HBV +, hep C ab reactive, February 23, 2010 hepatitis B DNA not detected,  HCV RNA  VL = 444000  . Shortness of breath     with exercise  . Cataract   . Glaucoma   . HOH (hard of hearing)     Past Surgical History  Procedure Laterality Date  . Open anterior shoulder reconstruction  2003     surgery performed June 26, 2001 by Dr. Onnie Graham, open right shoulder anterior reconstruction secondary to recurrent right shoulder anterior inferior dislocation  . Knee arthroplasty  08/02/2011    Procedure: COMPUTER ASSISTED TOTAL KNEE ARTHROPLASTY;  Surgeon: Mcarthur Rossetti, MD;  Location: [facility];  Service: Orthopedics;  Laterality: Right;  Right total knee arthroplasty  . Knee arthroscopy  10/01/2011    Procedure: ARTHROSCOPY KNEE;  Surgeon: Mcarthur Rossetti, MD;  Location: [facility];  Service: Orthopedics;  Laterality: Left;  with synevectomy    Maylon Peppers [place], [place], [place] Pager [phone] After Hours Pager

## 2013-10-25 NOTE — Progress Notes (Signed)
Pt transported on vent to CT and back to 3M04. No complications noted.

## 2013-10-25 NOTE — Progress Notes (Signed)
Patient extubated. 2L [place] place on patient. Family at bedside. Thayer Ohm D

## 2013-10-25 NOTE — Progress Notes (Signed)
Chaplain initiated visit with pt and pt wife after meeting pt daughter in hallway. Chaplain explored themes of loss and theodicy with pt wife. Chaplain offered sacred text as means of expression towards the divine. Chaplain present with pt wife as doctor updated family. Later chaplain present with granddaughter and worked with expression of feelings and facilitated story telling. Chaplain made family aware of her services. Will follow as needed.   10/25/13 1200  Clinical Encounter Type  Visited With Patient and family together;Health care provider  Visit Type Initial  Referral From Family  Spiritual Encounters  Spiritual Needs Emotional;Sacred text  Stress Factors  Family Stress Factors Loss;Major life changes;Health changes  [name], [name] 10/25/2013 12:43 PM

## 2013-10-25 NOTE — Progress Notes (Signed)
PULMONARY / CRITICAL CARE MEDICINE   Name: [name] MRN: [id number] DOB: 11-22-1945    ADMISSION DATE:  10/31/2013 CONSULTATION DATE:  11/20/2013  REFERRING MD :  Dr. Christella Noa  CHIEF COMPLAINT:  ICH  INITIAL PRESENTATION:  68 year old male, choked on apple at home, apneic on EMS arrival and intubated in ED. CT scan shows large ICH with intraventricular extension. To ICU Dr Christella Noa emergently placed bedside ventriculostomy. PCCM asked to assist with vent management and ICU care.   STUDIES:  CT head 10/1 > Large right frontal ICH with intraventricular extension to all 4 ventricles. 67mm midline shift. Concerning for herniation.   SIGNIFICANT EVENTS: 10/1 admitted to ICU, ventriculostomy placed  SUBJECTIVE: Severe HTN this AM on propofol, no events since IVC drain is placed.  VITAL SIGNS: Temp:  [95.4 F (35.2 C)-99.5 F (37.5 C)] 99.3 F (37.4 C) (10/02 0800) Pulse Rate:  [64-101] 79 (10/02 0800) Resp:  [13-30] 16 (10/02 0900) BP: (62-208)/(35-136) 133/58 mmHg (10/02 0800) SpO2:  [91 %-100 %] 99 % (10/02 0851) Arterial Line BP: (98-178)/(37-61) 166/52 mmHg (10/02 0800) FiO2 (%):  [50 %-100 %] 50 % (10/02 0900) Weight:  [117.3 kg (258 lb 9.6 oz)-120.7 kg (266 lb 1.5 oz)] 117.3 kg (258 lb 9.6 oz) (10/01 2115) HEMODYNAMICS:   VENTILATOR SETTINGS: Vent Mode:  [-] PRVC FiO2 (%):  [50 %-100 %] 50 % Set Rate:  [14 bmp-16 bmp] 16 bmp Vt Set:  [550 mL] 550 mL PEEP:  [5 cmH20] 5 cmH20 Plateau Pressure:  [14 cmH20-19 cmH20] 15 cmH20 INTAKE / OUTPUT:  Intake/Output Summary (Last 24 hours) at 10/25/13 0948 Last data filed at 10/25/13 0900  Gross per 24 hour  Intake 1803.58 ml  Output   3116 ml  Net -1312.42 ml    PHYSICAL EXAMINATION: General: no distress Neuro:  Comatose HEENT:  Pupils pinpoint and non reactive, ETT in place, ventriculostomy in place Cardiovascular:  Regular, no murmur Lungs:  Scattered rhonchi Abdomen:  Soft, non tender, + bowel  sounds Musculoskeletal:  No edema Skin:  No rashes  LABS:  CBC  Recent Labs Lab 11/15/2013 1646 10/25/13 0315  WBC 14.4* 17.2*  HGB 13.8 12.3*  HCT 41.0 36.6*  PLT 276 250   Coag's No results found for this basename: APTT, INR,  in the last 168 hours  BMET  Recent Labs Lab 11/13/2013 1646 10/25/13 0315  NA 141 138  K 3.3* 4.4  CL 102 102  CO2 20 21  BUN 24* 22  CREATININE 1.28 1.36*  GLUCOSE 142* 120*   Electrolytes  Recent Labs Lab 11/20/2013 1646 10/25/13 0315  CALCIUM 8.9 8.1*   Sepsis Markers  Recent Labs Lab 11/20/2013 1704  LATICACIDVEN 3.99*   ABG  Recent Labs Lab 11/22/2013 1737 11/02/2013 2207 10/25/13 0340  PHART 7.257* 7.281* 7.355  PCO2ART 51.6* 44.8 39.4  PO2ART 187.0* 169.0* 123.0*   Liver Enzymes  Recent Labs Lab 11/22/2013 1646  AST 18  ALT 10  ALKPHOS 104  BILITOT 0.3  ALBUMIN 3.8   Cardiac Enzymes No results found for this basename: TROPONINI, PROBNP,  in the last 168 hours  Glucose  Recent Labs Lab 10/28/2013 1641 11/12/2013 2133 10/27/2013 2342 10/25/13 0335 10/25/13 0831  GLUCAP 115* 143* 130* 113* 124*    Imaging Ct Head Wo Contrast  11/04/2013   CLINICAL DATA:  Choking, went unresponsive, vented  EXAM: CT HEAD WITHOUT CONTRAST  TECHNIQUE: Contiguous axial images were obtained from the base of the skull through the vertex without  intravenous contrast.  COMPARISON:  None.  FINDINGS: There is a large right frontal intraparenchymal hematoma measuring 3 x 5 cm with intraventricular extension. There is a large amount of hemorrhage in the lateral ventricles, third ventricle and extending into the fourth ventricle. There is loss of the normal sulci consistent with increased intracranial pressure. There is crowding of the foramen magnum. There is 9 mm of right to left midline shift. The basal cisterns are effaced.  Visualized portions of the orbits are unremarkable. There is evidence of prior sinus surgery. There is mild left maxillary  sinus and bilateral ethmoid sinus mucosal thickening.  The osseous structures are unremarkable.  IMPRESSION: 1. Large right frontal intraparenchymal hematoma measuring 3 x 5 cm with intraventricular extension. Large amount of hemorrhage in the lateral ventricles, third ventricle and extending into the fourth ventricle. There is effacement of the normal sulci, crowding of the foramen magnum, effacement of the basal cisterns and 9 mm of right-to-left midline shift. Crowding of the foramen magnum is concerning for tonsillar herniation. Critical Value/emergent results were called by telephone at the time of interpretation on 11/11/2013 at 6:21 pm to Dr. Sol Passer , who verbally acknowledged these results.   Electronically Signed   By: Kathreen Devoid   On: 10/30/2013 18:21   Dg Chest Port 1 View  11/08/2013   CLINICAL DATA:  Next central line placement.  Endotracheal tube.  EXAM: PORTABLE CHEST - 1 VIEW  COMPARISON:  11/18/2013  FINDINGS: Endotracheal tube tip measures 3.9 cm above the carina. Enteric tube tip is off the field of view but below the left hemidiaphragm. Interval placement of a right central venous catheter with tip over the low SVC region. No pneumothorax. Shallow inspiration with atelectasis in both lung bases. Probable small pleural effusions.  IMPRESSION: Appliances appear in satisfactory location. Shallow inspiration with atelectasis in the lung bases. Probable small pleural effusions.   Electronically Signed   By: Lucienne Capers M.D.   On: 11/04/2013 21:15   Dg Chest Portable 1 View  10/28/2013   CLINICAL DATA:  Choking.  Endotracheal tube placement.  EXAM: PORTABLE CHEST - 1 VIEW  COMPARISON:  Jun 10, 2013.  FINDINGS: Stable cardiomediastinal silhouette. Endotracheal tube is seen projected over the tracheal air shadow with distal tip 3 cm above the carina. Nasogastric tube is seen entering the stomach. No pneumothorax is noted. No significant pleural effusion is noted. Mild left basilar  subsegmental atelectasis is noted. Bony thorax is intact.  IMPRESSION: Endotracheal tube is in grossly good position. Stable mild left basilar subsegmental atelectasis.   Electronically Signed   By: Sabino Dick M.D.   On: 11/09/2013 17:14     ASSESSMENT / PLAN: NEUROLOGIC A:   Large right intracranial hemorrhage with intraventricular extension with increased ICP. Possible seizure on admission. P:   RASS goal: -1 Management per neurosurgery Repeat head CT today.  PULMONARY OETT 10/1 >>> A:   Acute respiratory failure 2nd to inability to protect airway. Hx of COPD. Tobacco abuse. Respiratory acidosis P:   Full vent support Adjust RR/Vt for ABG F/u CXR, ABG in AM PRN BD's Titrate O2 for sat of 88-92%.  CARDIOVASCULAR CVL 10/1 >>> A:  Hx of HTN, HLD. P:  Monitor hemodynamics Add hydralazine PRN Cardene drip as needed, target SBP of 160.  RENAL A:   Hypokalemia. P:   F/u and replace electrolytes as needed Monitor renal fx, urine outpt IV hydration. Check CVP and hydrate as appropriate.  GASTROINTESTINAL A:   Nutrition.  P:   Consult nutrition for TF as per nutrition Protonix for SUP  HEMATOLOGIC A:   No acute issues. P:  F/u CBC SCD for DVT prevention  INFECTIOUS A:   No evidence for infection. P:   Monitor clinically  ENDOCRINE A:   Hyperglycemia >> no hx of DM. P:   SSI  Family updated: No family bedside.   Interdisciplinary Family Meeting v Palliative Care Meeting:   CC time 35 minutes.  Rush Farmer, M.D. [facility] Pulmonary/Critical Care Medicine. Pager: [phone]. After hours pager: [phone].

## 2013-10-25 NOTE — Consult Note (Addendum)
Neurology Consultation Reason for Consult: [name] Referring Physician: Cyndy Freeze, K  CC: unresponsiveness  History is obtained from:wife  HPI: [name] is a 68 y.o. male with a history of hypertension, hyperlipidemia who did not take any medications and had sudden loss of consciousness yesterday. His wife is concerned that he might be choking, but on arrival to the ED was found that he had a large intracerebral hemorrhage.  He had extensive intraventricular hemorrhage with hydrocephalus and therefore a drain was placed.  Overnight, he has had no improvement and a repeat CT scan she is worsening of his hemorrhage as well as edema.    ROS:  Unable to obtain due to altered mental status.   Past Medical History  Diagnosis Date  . Abnormal liver function tests 01/2010     on hospital admission February 23, 2010 AP = 36, AST = 62, ALT = 40, GGT = 392  . COPD (chronic obstructive pulmonary disease)   . Hypertension   . Hyperlipidemia   . GERD (gastroesophageal reflux disease)   . Anxiety   . Depression   . Pruritus 06/2009     this is most likely secondary to cholestasis, last bilirubin 2.7 (02-25-2010)  . Hepatitis C 2012     a hepatitis panel done February 19, 1998 showed anti-HBc +, anti-HBV +, hep C ab reactive, February 23, 2010 hepatitis B DNA not detected, HCV RNA  VL = 444000  . Shortness of breath     with exercise  . Cataract   . Glaucoma   . HOH (hard of hearing)     Family History: Unable to obtain due to altered mental status.  Social History: Tob: Unable to obtain due to altered mental status.  Exam: Current vital signs: BP 156/66  Pulse 71  Temp(Src) 98.8 F (37.1 C) (Core (Comment))  Resp 20  Ht 5\' 7"  (1.702 m)  Wt 117.3 kg (258 lb 9.6 oz)  BMI 40.49 kg/m2  SpO2 96% Vital signs in last 24 hours: Temp:  [95.4 F (35.2 C)-99.7 F (37.6 C)] 98.8 F (37.1 C) (10/02 1100) Pulse Rate:  [64-101] 71 (10/02 1220) Resp:  [13-30] 20 (10/02 1220) BP:  (62-208)/(35-136) 156/66 mmHg (10/02 1220) SpO2:  [91 %-100 %] 96 % (10/02 1220) Arterial Line BP: (98-192)/(37-63) 184/60 mmHg (10/02 1100) FiO2 (%):  [50 %-100 %] 50 % (10/02 1220) Weight:  [117.3 kg (258 lb 9.6 oz)-120.7 kg (266 lb 1.5 oz)] 117.3 kg (258 lb 9.6 oz) (10/01 2115)  General: In bed intubated CV: Regular rate and rhythm Mental Status: Patient is comatose, does not follow commands. Cranial Nerves: II: Does not blink to threat Pupils are right 2 mm, left 1 mm, both are minimally reactive.  Discs are difficult to visualize. III,IV, VI: Eyes are disconjugate without doll's-eye  V, VII: Corneal present on right, very weak on left VIII, X, XI, XII: Unable to assess secondary to patient's altered mental status.  Motor: Tone is normal. Bulk is normal. He has no motor response to noxious stimuli in either upper extremity, flexion to noxious stimuli in bilateral lower extremities Sensory: As above Deep Tendon Reflexes: 2+ and symmetric in the biceps and patellae.  Cerebellar: Unable to obtain due to altered mental status. Gait: Unable to obtain due to altered mental status.   I have reviewed labs in epic and the results pertinent to this consultation are: BMP mild creatinine elevation  I have reviewed the images obtained: CT head-very large intraparenchymal hemorrhage with extensive intraventricular hemorrhage.  He has some effacement of the basilar cisterns  Impression: 68 year-old male with devastating intraparenchymal hemorrhage. Family was present during my interview, I discussed with them that he was not going to be functional following this injury, if he were to survive it. They indicated that if things were to get worse(e.g. cardiac arrhythmia), they would not want to resuscitated, but would like to continue supportive care for the present time without escalating care(e.g. hypertonic saline, etc). I suspect his hemorrhage is due to untreated hypertensive  disease.  Recommendations: 1) continue family discussions, my suspicion is that they feel that the patient would not want to be severely disabled and want to pursue comfort care. They are not ready at the present time, however. 2) avoid anticoagulants 3) blood pressure goal is 940 systolic    Roland Rack, MD [facility] [phone]  If 7pm- 7am, please page neurology on call as listed in [place].

## 2013-10-25 NOTE — Plan of Care (Signed)
Problem: Consults Goal: Nutrition Consult-if indicated Outcome: Completed/Met Date Met:  10/25/13 Dietician consulted, family will be withdrawing care on patient

## 2013-10-25 NOTE — Progress Notes (Signed)
Patient ID: [name], male   DOB: 1945-06-15, 68 y.o.   MRN: [id number] BP 185/68  Pulse 78  Temp(Src) 97.5 F (36.4 C) (Core (Comment))  Resp 28  Ht 5\' 7"  (1.702 m)  Wt 117.3 kg (258 lb 9.6 oz)  BMI 40.49 kg/m2  SpO2 93% Not responsive, repeat head ct shows enlarging hematoma, ventricles are smaller.  His wife has decided for comfort measures, and extubation. I do agree with her decision. Appreciate neurology help.

## 2013-10-26 ENCOUNTER — Inpatient Hospital Stay (HOSPITAL_COMMUNITY): Payer: Medicare HMO

## 2013-11-05 NOTE — Discharge Summary (Addendum)
  Physician Discharge Summary  Patient ID: Jaxsun Ciampi MRN: [id number] DOB/AGE: March 28, 1945 68 y.o.  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 Active Problems:   ICH (intracerebral hemorrhage)   Acute respiratory failure with hypoxia   Discharged Condition: deceased  Hospital Course: Mr. Arora was admitted in extremis. I placed a ventricular catheter emergently secondary to a large right frontal ICH with ventricular extension. The catheter did drain well, however he did not improve at all clinically. The family after discussion with the medical staff felt that comfort measures were appropriate. Mr. Casaus expired after active medical care was withdrawn.   Treatments: ventricular catheter placement  Discharge Exam: Blood pressure 185/68, pulse 84, temperature 100.6 F (38.1 C), temperature source Core (Comment), resp. rate 3, height 5\' 7"  (1.702 m), weight 117.3 kg (258 lb 9.6 oz), SpO2 62.00%. deceased  Disposition: 20-Expired * No surgery found *    Medication List    ASK your doctor about these medications       albuterol 108 (90 BASE) MCG/ACT inhaler  Commonly known as:  VENTOLIN HFA  Inhale 2 puffs into the lungs every 4 (four) hours as needed. For shortness of breath or wheezing.     aspirin 81 MG chewable tablet  Chew 81 mg by mouth daily.     chlorpheniramine 4 MG tablet  Commonly known as:  CHLOR-TRIMETON  Take 1 tablet (4 mg total) by mouth 2 (two) times daily as needed for allergies.     cholestyramine 4 G packet  Commonly known as:  QUESTRAN  Take 1 packet by mouth 3 (three) times daily with meals.     dorzolamide 2 % ophthalmic solution  Commonly known as:  TRUSOPT  Place 1 drop into the left eye 3 (three) times daily.     FLUoxetine 20 MG capsule  Commonly known as:  PROZAC  Take 20 mg by mouth daily.      fluticasone 50 MCG/ACT nasal spray  Commonly known as:  FLONASE  Place 2 sprays into both nostrils daily.     Fluticasone-Salmeterol 250-50 MCG/DOSE Aepb  Commonly known as:  ADVAIR DISKUS  Inhale 1 puff into the lungs 2 (two) times daily.     HARVONI 90-400 MG Tabs  Generic drug:  Ledipasvir-Sofosbuvir     latanoprost 0.005 % ophthalmic solution  Commonly known as:  XALATAN  Place 1 drop into the right eye at bedtime.     lisinopril-hydrochlorothiazide 10-12.5 MG per tablet  Commonly known as:  PRINZIDE,ZESTORETIC  Take 1 tablet by mouth 2 (two) times daily.     nicotine 21 mg/24hr patch  Commonly known as:  NICODERM CQ - dosed in mg/24 hours  Place 1 patch (21 mg total) onto the skin daily.     omeprazole 40 MG capsule  Commonly known as:  PRILOSEC  Take 40 mg by mouth daily.     traMADol 50 MG tablet  Commonly known as:  ULTRAM  Take 1 tablet (50 mg total) by mouth every 6 (six) hours as needed for moderate pain.         Signed: Corinda Ammon L 11/05/2013, 6:05 PM

## 2013-11-24 NOTE — Progress Notes (Signed)
50 ml of Morphine wasted, witnessed by this RN and Dulcy Fanny, RN. Radonna Ricker, RN

## 2013-11-24 NOTE — Progress Notes (Signed)
PULMONARY / CRITICAL CARE MEDICINE   Name: [name] MRN: [id number] DOB: Aug 05, 1945    ADMISSION DATE:  10/27/2013 CONSULTATION DATE:  10/27/2013  REFERRING MD :  Dr. Christella Noa  CHIEF COMPLAINT:  ICH  INITIAL PRESENTATION:  68 year old male, choked on apple at home, apneic on EMS arrival and intubated in ED. CT scan shows large ICH with intraventricular extension. To ICU Dr Christella Noa emergently placed bedside ventriculostomy. PCCM asked to assist with vent management and ICU care.   STUDIES:  CT head 10/1 > Large right frontal ICH with intraventricular extension to all 4 ventricles. 7mm midline shift. Concerning for herniation.   SIGNIFICANT EVENTS: 10/1 admitted to ICU, ventriculostomy placed 10/3 terminal extubation per neuro  SUBJECTIVE: Extubated   VITAL SIGNS: Temp:  [97 F (36.1 C)-99.5 F (37.5 C)] 99.4 F (37.4 C) (10/03 0700) Pulse Rate:  [61-124] 105 (10/03 0700) Resp:  [16-39] 24 (10/03 0700) BP: (140-217)/(62-191) 185/68 mmHg (10/02 2038) SpO2:  [50 %-99 %] 77 % (10/03 0700) Arterial Line BP: (113-256)/(44-80) 214/75 mmHg (10/03 0700) FiO2 (%):  [50 %] 50 % (10/02 2000) HEMODYNAMICS:   VENTILATOR SETTINGS: Vent Mode:  [-] PRVC FiO2 (%):  [50 %] 50 % Set Rate:  [16 bmp] 16 bmp Vt Set:  [550 mL] 550 mL PEEP:  [5 cmH20] 5 cmH20 Plateau Pressure:  [17 cmH20] 17 cmH20 INTAKE / OUTPUT:  Intake/Output Summary (Last 24 hours) at 11/23/2013 1108 Last data filed at 2013-11-23 0700  Gross per 24 hour  Intake 1936.9 ml  Output   1438 ml  Net  498.9 ml    PHYSICAL EXAMINATION: General: no distress Neuro:  Comatose HEENT:  Pupils pinpoint and non reactive, ventriculostomy in place, agonal breathing Cardiovascular:  Regular, no murmur Lungs:  Scattered rhonchi Abdomen:  Soft, non tender, + bowel sounds Musculoskeletal:  No edema Skin:  No rashes  LABS:  CBC  Recent Labs Lab 10/25/2013 1646 10/25/13 0315  WBC 14.4* 17.2*  HGB 13.8 12.3*  HCT 41.0 36.6*   PLT 276 250   Coag's No results found for this basename: APTT, INR,  in the last 168 hours  BMET  Recent Labs Lab 11/06/2013 1646 10/25/13 0315  NA 141 138  K 3.3* 4.4  CL 102 102  CO2 20 21  BUN 24* 22  CREATININE 1.28 1.36*  GLUCOSE 142* 120*   Electrolytes  Recent Labs Lab 11/07/2013 1646 10/25/13 0315  CALCIUM 8.9 8.1*   Sepsis Markers  Recent Labs Lab 10/30/2013 1704  LATICACIDVEN 3.99*   ABG  Recent Labs Lab 11/04/2013 1737 11/14/2013 2207 10/25/13 0340  PHART 7.257* 7.281* 7.355  PCO2ART 51.6* 44.8 39.4  PO2ART 187.0* 169.0* 123.0*   Liver Enzymes  Recent Labs Lab 10/31/2013 1646  AST 18  ALT 10  ALKPHOS 104  BILITOT 0.3  ALBUMIN 3.8   Cardiac Enzymes No results found for this basename: TROPONINI, PROBNP,  in the last 168 hours  Glucose  Recent Labs Lab 11/11/2013 2133 11/18/2013 2342 10/25/13 0335 10/25/13 0831 10/25/13 1217 10/25/13 1646  GLUCAP 143* 130* 113* 124* 134* 109*    Imaging Ct Head Wo Contrast  10/25/2013   CLINICAL DATA:  Subsequent encounter for intracranial hemorrhage. Placement of intracranial drain.  EXAM: CT HEAD WITHOUT CONTRAST  TECHNIQUE: Contiguous axial images were obtained from the base of the skull through the vertex without intravenous contrast.  COMPARISON:  CT head without contrast 11/21/2013.  FINDINGS: A left frontal ventriculostomy drain has been placed. The ventricles are  decompressed compared to the prior study. Extensive hemorrhage is noted within the right basal ganglia. The extent of high-density hemorrhage has increased since the initial study, now measuring 6.1 x 3.8 cm. Midline shift is also increased, now measuring 19 mm maximally. What is seen throughout the ventricular system. Blood is noted along the choroid plexus bilaterally and layering in the posterior horns of the lateral ventricles bilaterally. There is effacement of the cisterns and foramen magnum suggesting downward herniation similar to the prior  study.  Fluid is evident within the maxillary sinuses and sphenoid sinuses similar to the prior study.  IMPRESSION: 1. Increased extent of the hemorrhagic area compatible with progressive hemorrhage. 2. Progressive mass effect with increased midline shift now measuring up to 19 mm. 3. Decompression of the ventricular system. 4. Extensive intraventricular hemorrhage. 5. Effacement of the basal cisterns and foramen magnum compatible with a down or herniation similar to the prior study.   Electronically Signed   By: Lawrence Santiago M.D.   On: 10/25/2013 11:43   Dg Chest Port 1 View  10/25/2013   CLINICAL DATA:  Intracerebral hemorrhage with acute respiratory failure and hypoxia. Check endotracheal tube placement  EXAM: PORTABLE CHEST - 1 VIEW  COMPARISON:  11/21/2013  FINDINGS: Cardiac shadow is mildly enlarged but stable. An endotracheal tube is again seen 2.5 cm above the carina. A right central venous line is noted in the distal superior vena cava. A nasogastric catheter courses into the stomach. Bibasilar atelectatic changes are again noted. Small pleural effusions are noted as well. No bony abnormality is seen.  IMPRESSION: When compared with the prior exam, the overall appearance is stable with bibasilar atelectatic changes and small effusions.   Electronically Signed   By: Inez Catalina M.D.   On: 10/25/2013 07:56     ASSESSMENT / PLAN: Terminal extubation 11-14-22 with ensuing death.  Richardson Landry Minor ACNP Maryanna Shape PCCM Pager [phone] till 3 pm If no answer page [phone] 2013-11-13, 11:10 AM  Chesley Mires, MD [place] 13-Nov-2013, 1:10 PM Pager:  [phone] After 3pm call: [phone]

## 2013-11-24 NOTE — Progress Notes (Signed)
Patient expired at 1105.  Family at bedside.  Dr. Christella Noa notified.  [place] notified.  CDS was called at 1130.  Will continue to comfort family.

## 2013-11-24 DEATH — deceased

## 2014-12-04 IMAGING — US US ABDOMEN COMPLETE
1 series · 13 of 25 positions shown · non-contrast
Comparison: MR abdomen of 09/01/2010 and ultrasound of the abdomen
of 07/09/2010

CLINICAL DATA: Chronic hepatitis C

COMPLETE ABDOMINAL ULTRASOUND

[Series 1: us abdomen complete · 0.29mm/px · 13 of 105 slices shown]
[im 1/105]
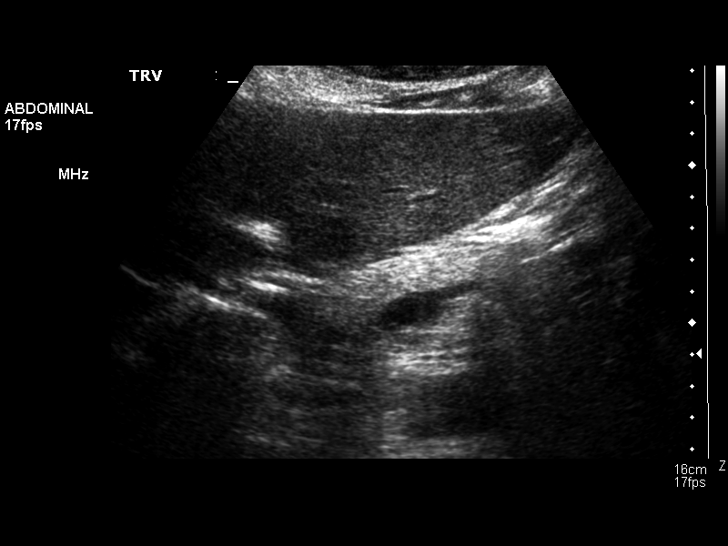
[im 9/105]
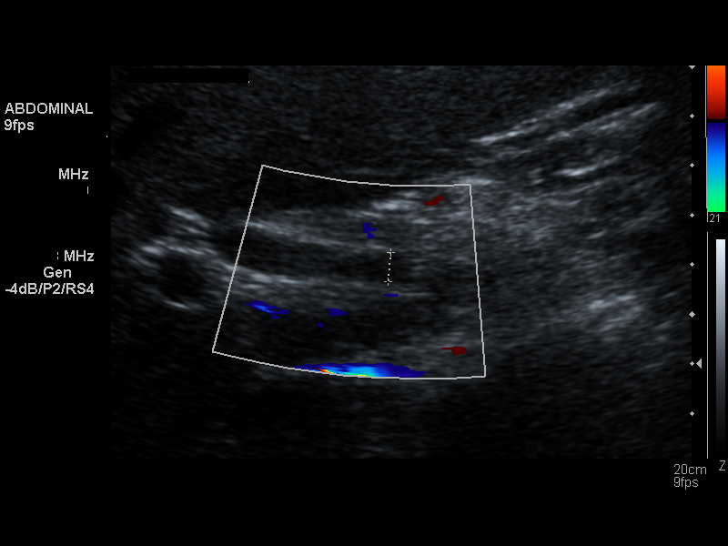
[im 18/105]
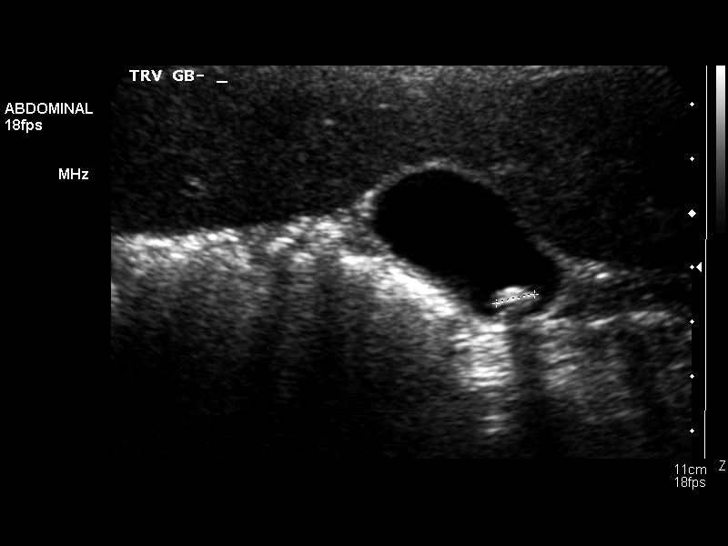
[im 27/105]
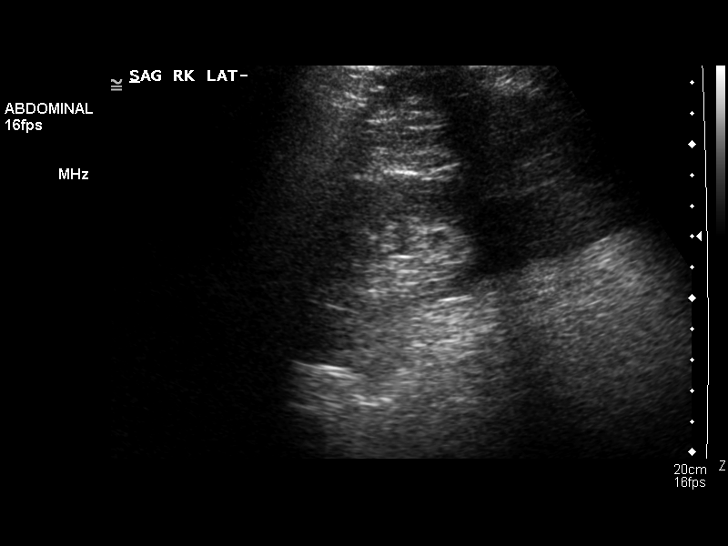
[im 35/105]
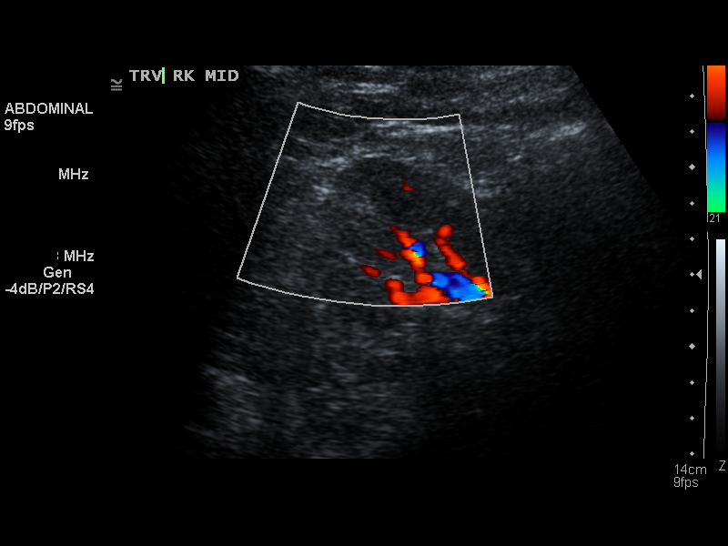
[im 44/105]
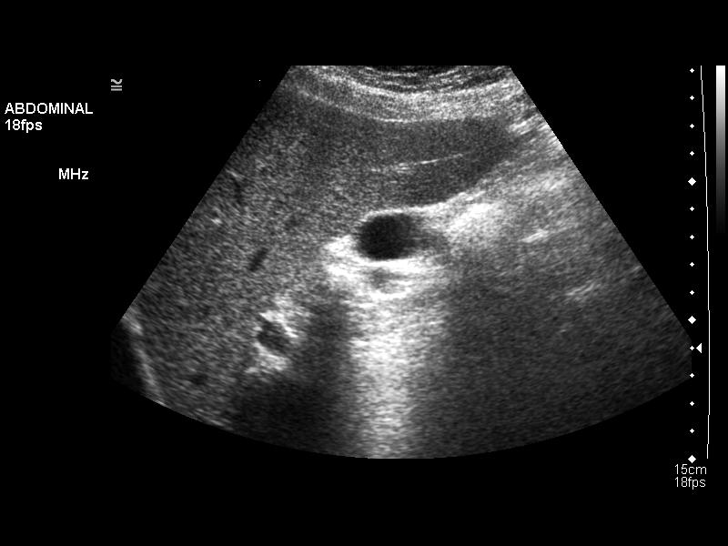
[im 53/105]
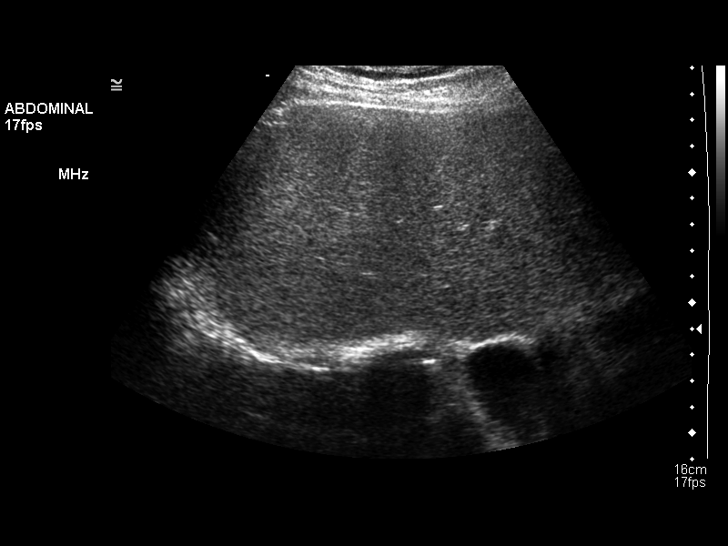
[im 61/105]
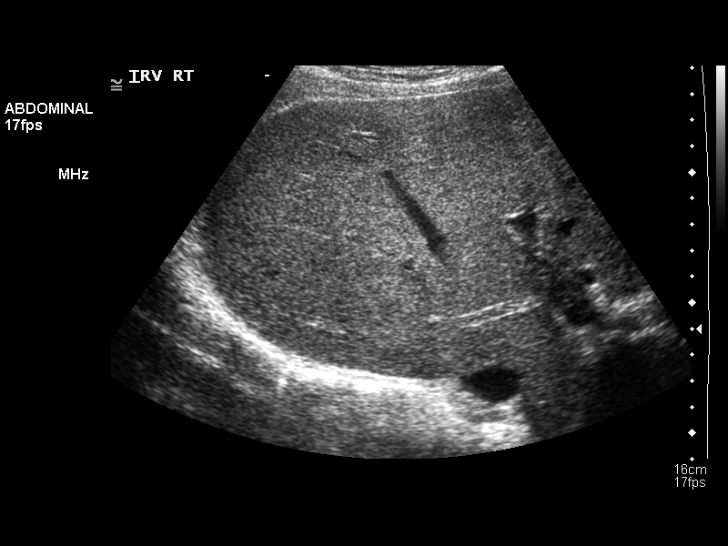
[im 70/105]
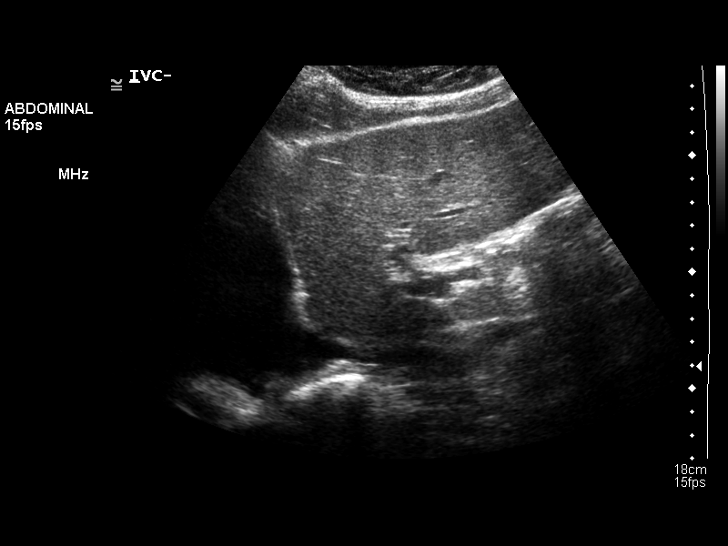
[im 79/105]
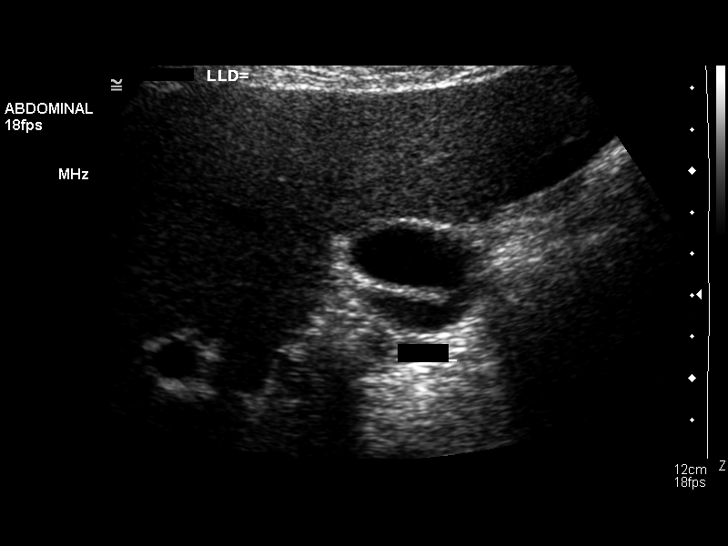
[im 87/105]
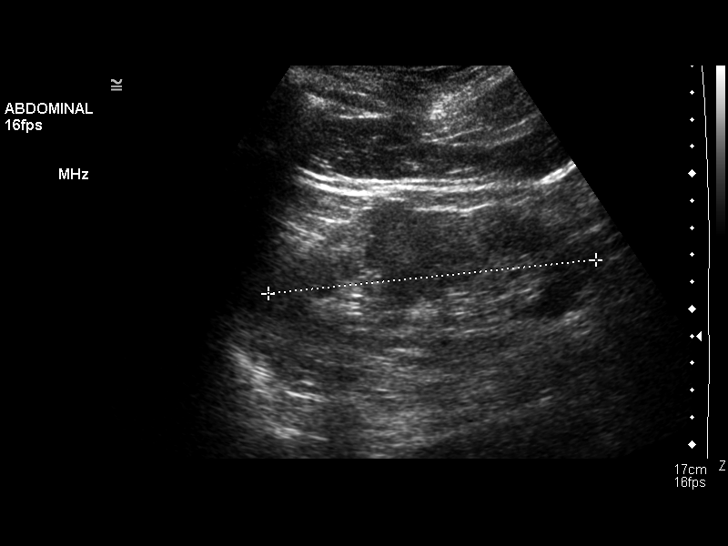
[im 96/105]
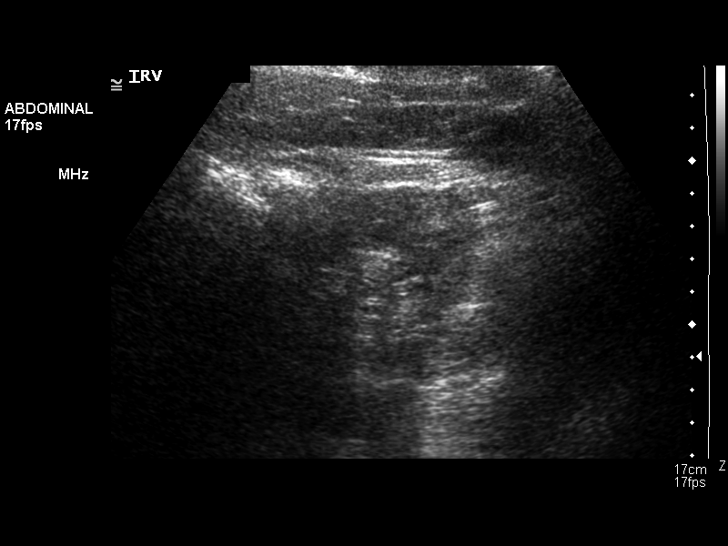
[im 105/105]
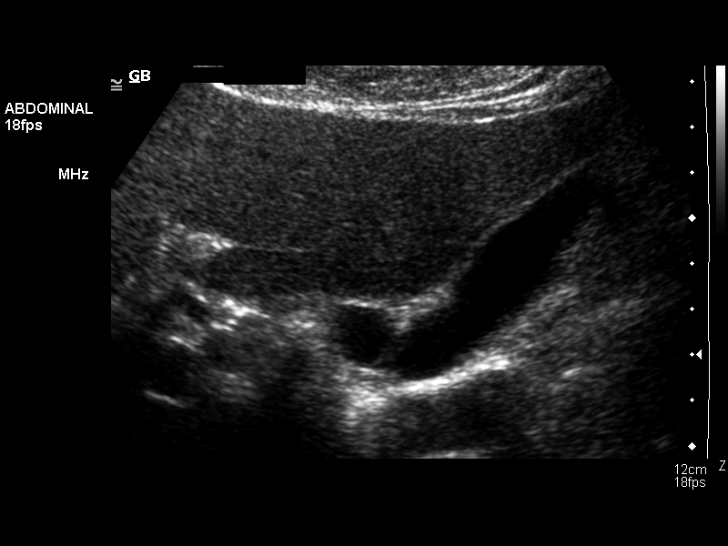

[13 of 25 positions shown; findings below may reference images not displayed]

FINDINGS: Gallbladder:  The gallbladder is visualized and there are at least
two mobile gallstones within the gallbladder with shadowing.  There
is no pain over the gallbladder currently with compression and the
gallbladder wall is not thickened.

Common bile duct:  The common bile duct measures 5.8 mm distally,
but 8.3 nanometers proximally.  Correlation with liver function
tests is recommended.

Liver:    The liver is inhomogeneous suggesting fatty infiltration.
No focal hepatic abnormality is seen.

IVC:  The IVC is obscured by bowel gas.

Pancreas:  The mid body of the pancreas appears normal.  Portions
of the head and tail of the pancreas also are obscured by bowel
gas.

Spleen:  The spleen is partially obscured by bowel gas measuring
3.8 cm sagittally.

Right Kidney:  The right kidney also is partially obscured by bowel
gas measuring 10.3 cm sagittally.  A solid mass cannot be excluded
in the mid right kidney measuring 2.6 x 3.3 x 4.1 cm with blood
flow.  Further assessment with CT of the abdomen is recommended.

Left Kidney:  The left kidney measures 12.2 cm sagittally with
somewhat lobular contours.

Abdominal aorta:  The abdominal aorta is normal in caliber although
obscured distally.

Some of the anatomy is obscured by a large amount of bowel gas in
this patient as well as body habitus.
IMPRESSION: 1.  Cannot exclude a right renal lesion although bowel gas obscures
evaluation of much of the anatomic detail.  Recommend CT of the
abdomen with IV contrast to assess further.
2.  Two mobile gallstones.  No present evidence of acute
cholecystitis.

3.  Slightly prominent common bile duct.  Correlate with LFTs.
4.  Inhomogeneous liver suggesting mild fatty infiltration.
